# Patient Record
Sex: Female | Born: 1935 | Race: White | Hispanic: No | Smoking: Never smoker
Health system: Southern US, Community
[De-identification: ages and names within clinical notes are randomized; demographics above are authoritative.]

## PROBLEM LIST (undated history)

## (undated) DIAGNOSIS — H409 Unspecified glaucoma: Secondary | ICD-10-CM

## (undated) DIAGNOSIS — M1990 Unspecified osteoarthritis, unspecified site: Secondary | ICD-10-CM

## (undated) DIAGNOSIS — L02419 Cutaneous abscess of limb, unspecified: Secondary | ICD-10-CM

## (undated) DIAGNOSIS — K219 Gastro-esophageal reflux disease without esophagitis: Secondary | ICD-10-CM

## (undated) DIAGNOSIS — J45909 Unspecified asthma, uncomplicated: Secondary | ICD-10-CM

## (undated) DIAGNOSIS — E039 Hypothyroidism, unspecified: Secondary | ICD-10-CM

## (undated) DIAGNOSIS — C801 Malignant (primary) neoplasm, unspecified: Secondary | ICD-10-CM

## (undated) DIAGNOSIS — H269 Unspecified cataract: Secondary | ICD-10-CM

## (undated) DIAGNOSIS — E079 Disorder of thyroid, unspecified: Secondary | ICD-10-CM

## (undated) DIAGNOSIS — I10 Essential (primary) hypertension: Secondary | ICD-10-CM

## (undated) DIAGNOSIS — L03119 Cellulitis of unspecified part of limb: Secondary | ICD-10-CM

## (undated) DIAGNOSIS — D649 Anemia, unspecified: Secondary | ICD-10-CM

## (undated) DIAGNOSIS — K222 Esophageal obstruction: Secondary | ICD-10-CM

## (undated) HISTORY — DX: Essential (primary) hypertension: I10

## (undated) HISTORY — PX: FOOT SURGERY: SHX648

## (undated) HISTORY — DX: Disorder of thyroid, unspecified: E07.9

## (undated) HISTORY — PX: CATARACT EXTRACTION: SUR2

## (undated) HISTORY — DX: Unspecified cataract: H26.9

## (undated) HISTORY — DX: Unspecified osteoarthritis, unspecified site: M19.90

## (undated) HISTORY — PX: APPENDECTOMY: SHX54

## (undated) HISTORY — DX: Unspecified glaucoma: H40.9

## (undated) HISTORY — DX: Gastro-esophageal reflux disease without esophagitis: K21.9

## (undated) HISTORY — PX: EYE SURGERY: SHX253

## (undated) HISTORY — DX: Esophageal obstruction: K22.2

---

## 2000-02-01 ENCOUNTER — Encounter: Admission: RE | Admit: 2000-02-01 | Discharge: 2000-04-28 | Payer: Self-pay | Admitting: Orthopedic Surgery

## 2000-04-28 ENCOUNTER — Encounter: Admission: RE | Admit: 2000-04-28 | Discharge: 2000-07-27 | Payer: Self-pay | Admitting: Orthopedic Surgery

## 2000-07-13 ENCOUNTER — Ambulatory Visit (HOSPITAL_COMMUNITY): Admission: RE | Admit: 2000-07-13 | Discharge: 2000-07-13 | Payer: Self-pay | Admitting: Orthopedic Surgery

## 2000-07-13 ENCOUNTER — Encounter: Payer: Self-pay | Admitting: Orthopedic Surgery

## 2000-08-01 ENCOUNTER — Ambulatory Visit (HOSPITAL_COMMUNITY): Admission: RE | Admit: 2000-08-01 | Discharge: 2000-08-01 | Payer: Self-pay | Admitting: Orthopedic Surgery

## 2000-08-01 ENCOUNTER — Encounter: Payer: Self-pay | Admitting: Orthopedic Surgery

## 2000-08-08 ENCOUNTER — Encounter: Admission: RE | Admit: 2000-08-08 | Discharge: 2000-11-06 | Payer: Self-pay | Admitting: Orthopedic Surgery

## 2000-08-15 ENCOUNTER — Ambulatory Visit (HOSPITAL_COMMUNITY): Admission: RE | Admit: 2000-08-15 | Discharge: 2000-08-15 | Payer: Self-pay | Admitting: Neurosurgery

## 2000-08-15 ENCOUNTER — Encounter: Payer: Self-pay | Admitting: Neurosurgery

## 2000-11-15 ENCOUNTER — Encounter: Admission: RE | Admit: 2000-11-15 | Discharge: 2001-02-13 | Payer: Self-pay | Admitting: Internal Medicine

## 2001-03-07 ENCOUNTER — Encounter: Admission: RE | Admit: 2001-03-07 | Discharge: 2001-03-12 | Payer: Self-pay | Admitting: Internal Medicine

## 2001-06-26 ENCOUNTER — Encounter: Admission: RE | Admit: 2001-06-26 | Discharge: 2001-07-03 | Payer: Self-pay | Admitting: Orthopedic Surgery

## 2001-10-16 ENCOUNTER — Encounter: Admission: RE | Admit: 2001-10-16 | Discharge: 2001-10-22 | Payer: Self-pay | Admitting: Orthopedic Surgery

## 2002-02-06 ENCOUNTER — Encounter: Admission: RE | Admit: 2002-02-06 | Discharge: 2002-02-11 | Payer: Self-pay | Admitting: Internal Medicine

## 2002-06-10 ENCOUNTER — Encounter (HOSPITAL_BASED_OUTPATIENT_CLINIC_OR_DEPARTMENT_OTHER): Admission: RE | Admit: 2002-06-10 | Discharge: 2002-06-14 | Payer: Self-pay | Admitting: Orthopedic Surgery

## 2002-09-16 ENCOUNTER — Encounter (HOSPITAL_BASED_OUTPATIENT_CLINIC_OR_DEPARTMENT_OTHER): Admission: RE | Admit: 2002-09-16 | Discharge: 2002-12-15 | Payer: Self-pay | Admitting: Internal Medicine

## 2002-12-23 ENCOUNTER — Encounter (HOSPITAL_BASED_OUTPATIENT_CLINIC_OR_DEPARTMENT_OTHER): Admission: RE | Admit: 2002-12-23 | Discharge: 2003-03-23 | Payer: Self-pay | Admitting: Internal Medicine

## 2003-03-25 ENCOUNTER — Encounter (HOSPITAL_BASED_OUTPATIENT_CLINIC_OR_DEPARTMENT_OTHER): Admission: RE | Admit: 2003-03-25 | Discharge: 2003-06-23 | Payer: Self-pay | Admitting: Internal Medicine

## 2003-09-26 ENCOUNTER — Encounter (HOSPITAL_BASED_OUTPATIENT_CLINIC_OR_DEPARTMENT_OTHER): Admission: RE | Admit: 2003-09-26 | Discharge: 2003-10-21 | Payer: Self-pay | Admitting: Internal Medicine

## 2004-01-08 ENCOUNTER — Encounter (HOSPITAL_BASED_OUTPATIENT_CLINIC_OR_DEPARTMENT_OTHER): Admission: RE | Admit: 2004-01-08 | Discharge: 2004-02-16 | Payer: Self-pay | Admitting: Internal Medicine

## 2004-05-12 ENCOUNTER — Encounter (HOSPITAL_BASED_OUTPATIENT_CLINIC_OR_DEPARTMENT_OTHER): Admission: RE | Admit: 2004-05-12 | Discharge: 2004-05-27 | Payer: Self-pay | Admitting: Internal Medicine

## 2004-08-17 ENCOUNTER — Encounter (HOSPITAL_BASED_OUTPATIENT_CLINIC_OR_DEPARTMENT_OTHER): Admission: RE | Admit: 2004-08-17 | Discharge: 2004-08-31 | Payer: Self-pay | Admitting: Internal Medicine

## 2004-11-15 ENCOUNTER — Encounter (HOSPITAL_BASED_OUTPATIENT_CLINIC_OR_DEPARTMENT_OTHER): Admission: RE | Admit: 2004-11-15 | Discharge: 2004-12-03 | Payer: Self-pay | Admitting: Internal Medicine

## 2005-04-05 ENCOUNTER — Ambulatory Visit (HOSPITAL_COMMUNITY): Admission: RE | Admit: 2005-04-05 | Discharge: 2005-04-05 | Payer: Self-pay | Admitting: Ophthalmology

## 2006-12-25 ENCOUNTER — Emergency Department (HOSPITAL_COMMUNITY): Admission: EM | Admit: 2006-12-25 | Discharge: 2006-12-25 | Payer: Self-pay | Admitting: *Deleted

## 2010-12-15 ENCOUNTER — Encounter: Payer: Self-pay | Admitting: Gastroenterology

## 2011-01-06 NOTE — Letter (Signed)
Summary: New Patient letter  [facility]  [address] [place], [place] [address]   Phone: [phone]  Fax: [phone]       12/15/2010 MRN: [id number]  [facility] [address] [place], [place]  [address]  Dear Ms. Nardo,  Welcome to the Gastroenterology Division at [facility].    You are scheduled to see Dr.  Arlyce Dice on 01-19-11 at 10:45am on the 3rd floor at [facility], [address]  We ask that you try to arrive at our office 15 minutes prior to your appointment time to allow for check-in.  We would like you to complete the enclosed self-administered evaluation form prior to your visit and bring it with you on the day of your appointment.  We will review it with you.  Also, please bring a complete list of all your medications or, if you prefer, bring the medication bottles and we will list them.  Please bring your insurance card so that we may make a copy of it.  If your insurance requires a referral to see a specialist, please bring your referral form from your primary care physician.  Co-payments are due at the time of your visit and may be paid by cash, check or credit card.     Your office visit will consist of a consult with your physician (includes a physical exam), any laboratory testing he/she may order, scheduling of any necessary diagnostic testing (e.g. x-ray, ultrasound, CT-scan), and scheduling of a procedure (e.g. Endoscopy, Colonoscopy) if required.  Please allow enough time on your schedule to allow for any/all of these possibilities.    If you cannot keep your appointment, please call [phone] to cancel or reschedule prior to your appointment date.  This allows [place] the opportunity to schedule an appointment for another patient in need of care.  If you do not cancel or reschedule by 5 p.m. the business day prior to your appointment date, you will be charged a $50.00 late cancellation/no-show fee.    Thank you for  choosing [facility] for your medical needs.  We appreciate the opportunity to care for you.  Please visit [place] at our website  to learn more about our practice.                     Sincerely,                                                             The Gastroenterology Division

## 2011-01-19 ENCOUNTER — Ambulatory Visit (INDEPENDENT_AMBULATORY_CARE_PROVIDER_SITE_OTHER): Payer: Medicare Other | Admitting: Gastroenterology

## 2011-01-19 ENCOUNTER — Encounter: Payer: Self-pay | Admitting: Gastroenterology

## 2011-01-19 ENCOUNTER — Encounter (INDEPENDENT_AMBULATORY_CARE_PROVIDER_SITE_OTHER): Payer: Self-pay | Admitting: *Deleted

## 2011-01-19 DIAGNOSIS — R1310 Dysphagia, unspecified: Secondary | ICD-10-CM

## 2011-01-19 DIAGNOSIS — E119 Type 2 diabetes mellitus without complications: Secondary | ICD-10-CM | POA: Insufficient documentation

## 2011-01-21 ENCOUNTER — Other Ambulatory Visit: Payer: Self-pay | Admitting: Gastroenterology

## 2011-01-21 ENCOUNTER — Encounter (AMBULATORY_SURGERY_CENTER): Payer: Medicare Other | Admitting: Gastroenterology

## 2011-01-21 DIAGNOSIS — K222 Esophageal obstruction: Secondary | ICD-10-CM

## 2011-01-21 DIAGNOSIS — R1310 Dysphagia, unspecified: Secondary | ICD-10-CM

## 2011-01-21 DIAGNOSIS — K259 Gastric ulcer, unspecified as acute or chronic, without hemorrhage or perforation: Secondary | ICD-10-CM

## 2011-01-21 DIAGNOSIS — K2940 Chronic atrophic gastritis without bleeding: Secondary | ICD-10-CM

## 2011-01-21 LAB — GLUCOSE, CAPILLARY
Glucose-Capillary: 115 mg/dL — ABNORMAL HIGH (ref 70–99)
Glucose-Capillary: 105 mg/dL — ABNORMAL HIGH (ref 70–99)

## 2011-01-24 ENCOUNTER — Encounter: Payer: Self-pay | Admitting: Gastroenterology

## 2011-01-26 NOTE — Letter (Signed)
Summary: Diabetic Instructions  [facility]  [address] [place], [place] [address]   Phone: [phone]  Fax: [phone]    YVONDA FOUTY 1924-05-17 MRN: [id number]   X   ORAL DIABETIC MEDICATION INSTRUCTIONS  The day before your procedure:   Take your diabetic pill as you do normally  The day of your procedure:   Do not take your diabetic pill    We will check your blood sugar levels during the admission process and again in Recovery before discharging you home

## 2011-01-26 NOTE — Assessment & Plan Note (Signed)
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   History of Present Illness Visit Type: Initial Consult Primary GI MD: Melvia Heaps MD [facility] Primary Provider: Guerry Bruin, MD Requesting Provider: Guerry Bruin, MD Chief Complaint: Patient complains of solid food dysphagia and epigastric pain which she has had for several months. She does have trouble with having a BM. She has to sit on the toliet for 30-40 min to have a BM. She has hx of a prolapsed rectum. History of Present Illness:   Casey Watts is a pleasant 77 year old white female referred at the request of Dr. Neale Burly for evaluation of dysphagia.  She complains of dysphagia to solids.  When this occurs she may develop chest discomfort as well.  She is actually had to vomit up food and liquid.  On Prevacid she has minimal pyrosis.  Weight has been stable.  Years ago she was told that she had a narrowing in her distal esophagus.  Patient has a history of a prolapsed rectum.  While she has a daily bowel movement she has difficulty  passing stool and she may spend up to 40 minutes moving her bowels.  Stools are poorly formed.  She denies melena or hematochezia.  She has had a colonoscopy in the past and does not wish to repeat this.   GI Review of Systems    Reports dysphagia with solids.     Location of  Abdominal pain: epigastric area.    Denies abdominal pain, acid reflux, belching, bloating, chest pain, dysphagia with liquids, heartburn, loss of appetite, nausea, vomiting, vomiting blood, weight loss, and  weight gain.      Reports constipation and  diarrhea.     Denies anal fissure, black tarry stools, change in bowel habit, diverticulosis, fecal incontinence, heme positive stool, hemorrhoids, irritable bowel syndrome, jaundice, light color stool, liver problems, rectal bleeding, and  rectal pain. Preventive Screening-Counseling & Management  Alcohol-Tobacco     Smoking Status: never      Drug Use:   no.      Current Medications (verified): 1)  Prevacid 24hr 15 Mg Cpdr (Lansoprazole) .... Take One By Mouth Once Daily 2)  Synthroid 112 Mcg Tabs (Levothyroxine Sodium) .... Take One By Mouth Once Daily 3)  Evista 60 Mg Tabs (Raloxifene Hcl) .... Take One By Mouth Once Daily 4)  Diovan Hct 160-25 Mg Tabs (Valsartan-Hydrochlorothiazide) .... Take One By Mouth Once Daily 5)  Glimepiride 4 Mg Tabs (Glimepiride) .... Take One By Mouth Once Daily 6)  Celebrex 200 Mg Caps (Celecoxib) .... Take One By Mouth Every Morning 7)  Claritin 10 Mg Caps (Loratadine) .... Take One By Mouth As Needed 8)  D 1000 Plus  Tabs (Fa-Cyanocobalamin-B6-D-Ca) .... Take One By Mouth Once Daily 9)  Delsym Night Time Cough/cold 15-6.25-500 Mg/88ml Liqd (Dm-Doxylamine-Acetaminophen) .... Use As Needed 10)  Aleve 220 Mg Caps (Naproxen Sodium) .... Take Two By Mouth At Bedtime 11)  Tylenol Extra Strength 500 Mg Tabs (Acetaminophen) .... Take One By Mouth Once Daily  Allergies (verified): No Known Drug Allergies  Past History:  Past Medical History: Arthritis Diabetes Hypothyroidism  Past Surgical History: Appendectomy Cataract Extraction  foot surgery  Family History: Family History of Lung Cancer: Brother Smoker No FH of Colon Cancer: Family History of Heart Disease: brother  Social History: Patient has never smoked.  Alcohol Use - no Daily Caffeine Use 2 per day Illicit Drug Use - no Occupation: retired  widowed with four boys and 2  girls one deceased Smoking Status:  never Drug Use:  no  Review of Systems       The patient complains of allergy/sinus, arthritis/joint pain, back pain, cough, and hearing problems.  The patient denies anemia, anxiety-new, blood in urine, breast changes/lumps, change in vision, confusion, coughing up blood, depression-new, fainting, fatigue, fever, headaches-new, heart murmur, heart rhythm changes, itching, menstrual pain, muscle pains/cramps, night sweats, nosebleeds,  pregnancy symptoms, shortness of breath, skin rash, sleeping problems, sore throat, swelling of feet/legs, swollen lymph glands, thirst - excessive , urination - excessive , urination changes/pain, urine leakage, vision changes, and voice change.         All other systems were reviewed and were negative   Vital Signs:  Patient profile:   77 year old female Height:      61 inches Weight:      131.8 pounds BMI:     24.99 Pulse rate:   60 / minute Pulse rhythm:   regular BP sitting:   102 / 50  (left arm) Cuff size:   regular  Vitals Entered By: Harlow Mares CMA Duncan Dull) (January 19, 2011 11:19 AM)  Physical Exam  Additional Exam:  On physical exam she is an elderly female  skin: anicteric HEENT: normocephalic; PEERLA; no nasal or pharyngeal abnormalities neck: supple nodes: no cervical lymphadenopathy chest: clear to ausculatation and percussion heart: no murmurs, gallops, or rubs abd: soft, nontender; BS normoactive; no abdominal masses, tenderness, organomegaly rectal: deferred ext: no cynanosis, clubbing, edema skeletal: no deformities neuro: oriented x 3; no focal abnormalities    Impression & Recommendations:  Problem # 1:  DYSPHAGIA UNSPECIFIED (ICD-787.20)  She likely has a peptic stricture.  Recommendations #1 upper endoscopy with dilatation as indicated  Risks, alternatives, and complications of the procedure, including bleeding, perforation, and possible need for surgery, were explained to the patient.  Patient's questions were answered.  Orders: EGD (EGD)  Problem # 2:  DM (ICD-250.00) Assessment: Comment Only  Patient Instructions: 1)  Copy sent to : Guerry Bruin, MD 2)  Your procedure has been scheduled for 01/21/2011, please follow the seperate instructions.  3)  [facility] Patient Information Guide given to patient.  4)  Upper Endoscopy with Dilatation brochure given.  5)  The medication list was reviewed and reconciled.  All  changed / newly prescribed medications were explained.  A complete medication list was provided to the patient / caregiver.

## 2011-01-26 NOTE — Letter (Signed)
Summary: EGD Instructions  [facility]  [address] [place], [place] [address]   Phone: [phone]  Fax: [phone]       Casey Watts    10/09/1924    MRN: [id number]       Procedure Day /Date: Friday 01/21/2011     Arrival Time: 1:30pm     Procedure Time: 2:30pm     Location of Procedure:                    [facility] (4th Floor)  PREPARATION FOR ENDOSCOPY   On 01/21/2011 THE DAY OF THE PROCEDURE:  1.   No solid foods, milk or milk products are allowed after midnight the night before your procedure.  2.   Do not drink anything colored red or purple.  Avoid juices with pulp.  No orange juice.  3.  You may drink clear liquids until 12:30pm, which is 2 hours before your procedure.                                                                                                CLEAR LIQUIDS INCLUDE: Water Jello Ice Popsicles Tea (sugar ok, no milk/cream) Powdered fruit flavored drinks Coffee (sugar ok, no milk/cream) Gatorade Juice: apple, white grape, white cranberry  Lemonade Clear bullion, consomm, broth Carbonated beverages (any kind) Strained chicken noodle soup Hard Candy   MEDICATION INSTRUCTIONS  Unless otherwise instructed, you should take regular prescription medications with a small sip of water as early as possible the morning of your procedure.  Diabetic patients - see separate instructions.               OTHER INSTRUCTIONS  You will need a responsible adult at least 75 years of age to accompany you and drive you home.   This person must remain in the waiting room during your procedure.  Wear loose fitting clothing that is easily removed.  Leave jewelry and other valuables at home.  However, you may wish to bring a book to read or an iPod/MP3 player to listen to music as you wait for your procedure to start.  Remove all body piercing jewelry and leave at home.  Total time from sign-in until discharge is  approximately 2-3 hours.  You should go home directly after your procedure and rest.  You can resume normal activities the day after your procedure.  The day of your procedure you should not:   Drive   Make legal decisions   Operate machinery   Drink alcohol   Return to work  You will receive specific instructions about eating, activities and medications before you leave.    The above instructions have been reviewed and explained to me by   _______________________    I fully understand and can verbalize these instructions _____________________________ Date _________

## 2011-01-26 NOTE — Letter (Signed)
Summary: Results Letter  [facility]  [address] [place], [place] [address]   Phone: [phone]  Fax: [phone]        January 19, 2011 MRN: [id number]    [facility] [address] [place], [place]  [address]    Dear Ms. Facey,  It is my pleasure to have treated you recently as a new patient in my office. I appreciate your confidence and the opportunity to participate in your care.  Since I do have a busy inpatient endoscopy schedule and office schedule, my office hours vary weekly. I am, however, available for emergency calls everyday through my office. If I am not available for an urgent office appointment, another one of our gastroenterologist will be able to assist you.  My well-trained staff are prepared to help you at all times. For emergencies after office hours, a physician from our Gastroenterology section is always available through my 24 hour answering service  Once again I welcome you as a new patient and I look forward to a happy and healthy relationship             Sincerely,  Louis Meckel MD  This letter has been electronically signed by your physician.  Appended Document: Results Letter letter mailed

## 2011-01-26 NOTE — Procedures (Addendum)
Summary: Upper Endoscopy  Patient: Casey Watts Note: All result statuses are Final unless otherwise noted.  Tests: (1) Upper Endoscopy (EGD)   EGD Upper Endoscopy       DONE      [facility] Center     [address]     [place], [place]  [address]           ENDOSCOPY PROCEDURE REPORT           PATIENT:  Casey, Watts  MR#:  [id number]     BIRTHDATE:  11/18/1924, 86 yrs. old  GENDER:  female           ENDOSCOPIST:  Barbette Hair. Arlyce Dice, MD     Referred by:  Guerry Bruin, M.D.           PROCEDURE DATE:  01/21/2011     PROCEDURE:  EGD with biopsy, [address], Maloney Dilation of Esophagus     ASA CLASS:  Class II     INDICATIONS:  dysphagia           MEDICATIONS:   Fentanyl 25 mcg IV, Versed 4 mg IV, glycopyrrolate     (Robinal) 0.2 mg IV, 0.6cc simethancone 0.6 cc PO     TOPICAL ANESTHETIC:  Exactacain Spray           DESCRIPTION OF PROCEDURE:   After the risks benefits and     alternatives of the procedure were thoroughly explained, informed     consent was obtained.  The LB GIF-H180 [id number] endoscope was     introduced through the mouth and advanced to the third portion of     the duodenum, without limitations.  The instrument was slowly     withdrawn as the mucosa was fully examined.     <<PROCEDUREIMAGES>>           A stricture was found at the gastroesophageal junction. Early     stricture (see image9). Slightly tortuous esophagus Dilation with     maloney dilator 18mm Minimal resistance; no heme  An ulcer was     found pyloric channel Superficial 3-16mm ulcer  Multiple ulcers     were found in the antrum (see image6). Multiple superficial     ulcers; bxs taken  Otherwise the examination was normal (see     image4, image5, image8, and image10).    Retroflexion was not     performed.  The scope was then withdrawn from the patient and the     procedure completed.           COMPLICATIONS:  None           ENDOSCOPIC IMPRESSION:     1) Stricture at the gastroesophageal  junction     2) Ulcer in the pyloric channel     3) Ulcers, multiple in the antrum     4) Otherwise normal examination     RECOMMENDATIONS:     1) avoid NSAIDS     2) continue PPI     3) Call office next 2-3 days to schedule an office appointment     for 1 month           REPEAT EXAM:  No           ______________________________     Barbette Hair. Arlyce Dice, MD           CC:           n.     eSIGNED:   Barbette Hair. Arlyce Dice at  01/21/2011 04:00 PM           Hewitt Shorts, [id number]  Note: An exclamation mark (!) indicates a result that was not dispersed into the flowsheet. Document Creation Date: 01/21/2011 4:00 PM _______________________________________________________________________  (1) Order result status: Final Collection or observation date-time: 01/21/2011 15:52 Requested date-time:  Receipt date-time:  Reported date-time:  Referring Physician:   Ordering Physician: Melvia Heaps [phone]) Specimen Source:  Source: Launa Grill Order Number: [id number] Lab site:

## 2011-01-27 ENCOUNTER — Encounter: Payer: Self-pay | Admitting: Gastroenterology

## 2011-02-01 NOTE — Letter (Signed)
Summary: Appt Reminder 2  [facility]  [address]   [place], [place] [address]   Phone: [phone]  Fax: [phone]        January 24, 2011 MRN: [id number]    [facility] [address] [place], [place]  [address]    Dear Ms. Wadle,   You have a return appointment with Dr. Arlyce Dice on 02/28/11 at 9am.  Please remember to bring a complete list of the medicines you are taking, your insurance card and your co-pay.  If you have to cancel or reschedule this appointment, please call before 5:00 pm the evening before to avoid a cancellation fee.  If you have any questions or concerns, please call [phone].    Sincerely,    Selinda Michaels RN  Appended Document: Appt Reminder 2 Letter is mailed to the patient's home address

## 2011-02-01 NOTE — Letter (Addendum)
Summary: Results Letter  [facility]  [address] [place], [place] [address]   Phone: [phone]  Fax: [phone]        January 27, 2011 MRN: [id number]    [facility] [address] [place], [place]  [address]    Dear Ms. Rennert,   Your biopsies demonstrated inflammatory changes only.    Please follow the recommendations previously discussed.  Should you have any immediate concerns or questions, feel free to contact me at the office.    Sincerely,  Barbette Hair. Arlyce Dice, M.D., [facility]          Sincerely,  Louis Meckel MD  This letter has been electronically signed by your physician.  Appended Document: Results Letter letter mailed

## 2011-02-28 ENCOUNTER — Ambulatory Visit: Payer: Medicare Other | Admitting: Gastroenterology

## 2011-02-28 ENCOUNTER — Encounter: Payer: Self-pay | Admitting: Gastroenterology

## 2011-02-28 ENCOUNTER — Ambulatory Visit (INDEPENDENT_AMBULATORY_CARE_PROVIDER_SITE_OTHER): Payer: Medicare Other | Admitting: Gastroenterology

## 2011-02-28 DIAGNOSIS — K267 Chronic duodenal ulcer without hemorrhage or perforation: Secondary | ICD-10-CM | POA: Insufficient documentation

## 2011-02-28 DIAGNOSIS — K222 Esophageal obstruction: Secondary | ICD-10-CM | POA: Insufficient documentation

## 2011-02-28 NOTE — Assessment & Plan Note (Signed)
She has an NSAID- related ulcer.    recommendations #1 patient was advised to avoid NSAIDs with the exception of occasional Celebrex while continuing Prevacid.

## 2011-02-28 NOTE — Assessment & Plan Note (Signed)
Patient is asymptomatic following dilatation.   Recommendations #1 repeat dilatation when necessary

## 2011-02-28 NOTE — Progress Notes (Signed)
History of Present Illness:   Casey Watts has returned following upper endoscopy with dilatation of a distal esophageal stricture. A. Duodenal ulcer was noted. Biopsies were negative for H. Pylori. The patient had been taking Aleve regularly. She continues on Prevacid. Dysphagia is essentially gone. She has no other GI complaints. She takes Celebrex occasionally for joint pain.    Review of Systems: Pertinent positive and negative review of systems were noted in the above HPI section. All other review of systems were otherwise negative.    Current Medications, Allergies, Past Medical History, Past Surgical History, Family History and Social History were reviewed in [employer] electronic medical record  Vital signs were reviewed in today's medical record.

## 2011-02-28 NOTE — Patient Instructions (Addendum)
Cc. Richard Tisovec,MD Follow up as neededEsophageal Stricture (Narrowing) The esophagus is the long, narrow tube which carries food and liquid from the mouth to the stomach. Sometimes a part of the esophagus becomes narrow and makes it difficult, painful, or even impossible to swallow. This is called an esophageal stricture.  SYMPTOMS Some of the problems are difficulty swallowing or pain with swallowing. CAUSES Common causes of blockage or strictures of the esophagus are:  Exposure of the lower esophagus to the acid from the stomach may cause narrowing.   Hiatal hernia in which a small part of the stomach bulges up through the diaphragm can cause a narrowing in the bottom of the esophagus.   Scleroderma is a tissue disorder that affects the esophagus and makes swallowing difficult.   Achalasia is an absence of nerves in the lower esophagus and to the esophageal sphincter. This absence of nerves may be congenital (present since birth). This can cause irregular spasms which do not allow food and fluid through.   Strictures may develop from swallowing materials which damage the esophagus. Examples are acids or alkalis such as lye.   Schatzki's Ring is a narrow ring of non-cancerous tissue which narrows the lower esophagus. The cause of this is unknown.   Growths can block the esophagus.  DIAGNOSIS Your caregiver often suspects this problem by taking a medical history. They will also do a physical exam. They may then take X-rays and/or perform an endoscopy. Endoscopy is an exam in which a tube like a small flexible telescope is used to look at your esophagus.  TREATMENT AND PROCEDURE  One form of treatment is to dilate the narrow area. This means to stretch it.   When this is not successful, chest surgery may be required. This is a much more extensive form of treatment with a longer recovery time.  Both of the above treatments make the passage of food and water into the stomach easier. They  also make it easier for stomach contents to bubble back into the esophagus. Special medications may be used following the procedure to help prevent further narrowing. Medications may be used to lower the amount of acid in the stomach juice.  SEEK IMMEDIATE MEDICAL CARE IF:  Your swallowing is becoming more painful, difficult, or you are unable to swallow.   You vomit up blood.   You develop black tarry stools.   You develop chills or an unexplained fever of over 101 F (38.3 C).   You develop chest or abdominal pain.   You develop shortness of breath, feel lightheaded, or faint.  Follow up with medical care as your caregiver suggests. Document Released: 08/01/2006 Document Re-Released: 09/18/2007 [facility] Patient Information 2011 [place], [place].

## 2014-01-23 ENCOUNTER — Encounter (HOSPITAL_COMMUNITY): Payer: Self-pay | Admitting: Emergency Medicine

## 2014-01-23 ENCOUNTER — Emergency Department (HOSPITAL_COMMUNITY): Payer: Medicare Other

## 2014-01-23 ENCOUNTER — Emergency Department (HOSPITAL_COMMUNITY)
Admission: EM | Admit: 2014-01-23 | Discharge: 2014-01-23 | Disposition: A | Discharge status: Home / Self Care | Payer: Medicare Other | Attending: Emergency Medicine | Admitting: Emergency Medicine

## 2014-01-23 DIAGNOSIS — Y929 Unspecified place or not applicable: Secondary | ICD-10-CM | POA: Insufficient documentation

## 2014-01-23 DIAGNOSIS — E079 Disorder of thyroid, unspecified: Secondary | ICD-10-CM | POA: Insufficient documentation

## 2014-01-23 DIAGNOSIS — K219 Gastro-esophageal reflux disease without esophagitis: Secondary | ICD-10-CM | POA: Insufficient documentation

## 2014-01-23 DIAGNOSIS — I10 Essential (primary) hypertension: Secondary | ICD-10-CM | POA: Insufficient documentation

## 2014-01-23 DIAGNOSIS — M129 Arthropathy, unspecified: Secondary | ICD-10-CM | POA: Insufficient documentation

## 2014-01-23 DIAGNOSIS — Z79899 Other long term (current) drug therapy: Secondary | ICD-10-CM | POA: Insufficient documentation

## 2014-01-23 DIAGNOSIS — Y9389 Activity, other specified: Secondary | ICD-10-CM | POA: Insufficient documentation

## 2014-01-23 DIAGNOSIS — S42293A Other displaced fracture of upper end of unspecified humerus, initial encounter for closed fracture: Secondary | ICD-10-CM

## 2014-01-23 DIAGNOSIS — R296 Repeated falls: Secondary | ICD-10-CM | POA: Insufficient documentation

## 2014-01-23 DIAGNOSIS — Z791 Long term (current) use of non-steroidal anti-inflammatories (NSAID): Secondary | ICD-10-CM | POA: Insufficient documentation

## 2014-01-23 DIAGNOSIS — E119 Type 2 diabetes mellitus without complications: Secondary | ICD-10-CM | POA: Insufficient documentation

## 2014-01-23 MED ORDER — DOCUSATE SODIUM 100 MG PO CAPS: 100.0000 mg | ORAL_CAPSULE | Freq: Two times a day (BID) | ORAL | Status: AC | PRN

## 2014-01-23 MED ORDER — HYDROCODONE-ACETAMINOPHEN 5-325 MG PO TABS
ORAL_TABLET | ORAL | Status: DC
Start: 2014-01-23 — End: 2014-02-06

## 2014-01-23 MED ORDER — HYDROCODONE-ACETAMINOPHEN 5-325 MG PO TABS
1.0000 | ORAL_TABLET | Freq: Once | ORAL | Status: AC
  Administered 2014-01-23: 1 via ORAL
  Filled 2014-01-23: qty 1

## 2014-01-23 NOTE — Discharge Instructions (Signed)
Shoulder Fracture You have a fractured humerus (bone in the upper arm) at the shoulder just below the ball of the shoulder joint. Most of the time the bones of a broken shoulder are in an acceptable position. Usually the injury can be treated with a shoulder immobilizer or sling and swath bandage. These devices support the arm and prevent any shoulder movement. If the bones are not in a good position, then surgery is sometimes needed. Shoulder fractures usually cause swelling, pain, and discoloration around the upper arm initially. They heal in 8-12 weeks with proper treatment. Rest in bed or a reclining chair as long as your shoulder is very painful. Sitting up generally results in less pain at the fracture site. Do not remove your shoulder bandage until your caregiver approves. You may apply ice packs over the shoulder for 20-30 minutes every 2 hours for the next 2-3 days to reduce the pain and swelling. Use your pain medicine as prescribed.  SEEK IMMEDIATE MEDICAL CARE IF:  You develop severe shoulder pain unrelieved by rest and taking pain medicine.  You have pain, numbness, tingling, or weakness in the hand or wrist.  You develop shortness of breath, chest pain, severe weakness, or fainting.  You have severe pain with motion of the fingers or wrist. MAKE SURE YOU:   Understand these instructions.  Will watch your condition.  Will get help right away if you are not doing well or get worse. Document Released: 12/29/2004 Document Revised: 02/13/2012 Document Reviewed: 03/11/2009 [facility] Patient Information 2014 [place], [place].    Narcotic and benzodiazepine use may cause drowsiness, slowed breathing or dependence.  Please use with caution and do not drive, operate machinery or watch young children alone while taking them.  Taking combinations of these medications or drinking alcohol will potentiate these effects.

## 2014-01-23 NOTE — Progress Notes (Signed)
Orthopedic Tech Progress Note Patient Details:  Casey Watts May 07, 1924 [id number]  Ortho Devices Type of Ortho Device: Shoulder immobilizer Ortho Device/Splint Location: LUE Ortho Device/Splint Interventions: Ordered;Application   Braulio Bosch 01/23/2014, 5:56 PM

## 2014-01-23 NOTE — ED Notes (Addendum)
Pt reports to the ED via [facility] following a fall. Pt reports the fall occurred at 12:30 and pt was found sitting on the floor in the kitchen. Pt landed on her left upper arm and reports that the pain would not allow her to stand up. No obvious swelling or deformity present. Pt denies any LOC, syncope, head injury, neck, or acute back pain. Pt reports chronic back pain from scoliosis and denies any increased back pain. Pt reports she lost her balance but did not pass out. CMS intact. Full ROM limited by pain. VSS en route. Pt A&Ox4, resp e/u, and skin warm and dry.

## 2014-01-23 NOTE — ED Provider Notes (Signed)
CSN: [id number]     Arrival date & time 01/23/14  1414 History   First MD Initiated Contact with Patient 01/23/14 1506     Chief Complaint  Patient presents with  . Fall     (Consider location/radiation/quality/duration/timing/severity/associated sxs/prior Treatment) Patient is a 80 y.o. female presenting with fall and arm injury. The history is provided by the patient.  Fall This is a new problem. The current episode started 1 to 2 hours ago. Pertinent negatives include no chest pain, no abdominal pain, no headaches and no shortness of breath.  Arm Injury Location:  Shoulder and wrist Time since incident:  1 hour Injury: yes   Mechanism of injury: fall   Fall:    Fall occurred:  [employer] of impact:  Unable to specify   Entrapped after fall: no   Shoulder location:  L shoulder Wrist location:  L wrist Pain details:    Quality:  Shooting, cramping and sharp   Radiates to:  L wrist   Severity:  Moderate   Onset quality:  Sudden   Duration:  1 hour Chronicity:  New Handedness:  Right-handed Prior injury to area:  No Relieved by:  Rest Worsened by:  Movement Associated symptoms: decreased range of motion, stiffness and swelling   Associated symptoms: no back pain, no neck pain and no numbness   Risk factors: no concern for non-accidental trauma, no frequent fractures and no recent illness     Past Medical History  Diagnosis Date  . Diabetes mellitus   . Thyroid disease   . GERD (gastroesophageal reflux disease)   . Arthritis   . Hypertension   . Esophageal stricture    Past Surgical History  Procedure Laterality Date  . Appendectomy    . Cataract extraction    . Foot surgery     Family History  Problem Relation Age of Onset  . Colon cancer Neg Hx    History  Substance Use Topics  . Smoking status: Never Smoker   . Smokeless tobacco: Never Used  . Alcohol Use: No   OB History   Grav Para Term Preterm Abortions TAB SAB Ect Mult Living                  Review of Systems  Respiratory: Negative for chest tightness and shortness of breath.   Cardiovascular: Negative for chest pain.  Gastrointestinal: Negative for nausea, vomiting and abdominal pain.  Musculoskeletal: Positive for stiffness. Negative for back pain and neck pain.  Skin: Negative for wound.  Neurological: Negative for dizziness, syncope, light-headedness and headaches.  All other systems reviewed and are negative.      Allergies  Review of patient's allergies indicates no known allergies.  Home Medications   Current Outpatient Rx  Name  Route  Sig  Dispense  Refill  . acetaminophen (TYLENOL) 500 MG tablet   Oral   Take 500 mg by mouth daily as needed for mild pain.          . celecoxib (CELEBREX) 200 MG capsule   Oral   Take 200 mg by mouth daily.          Marland Kitchen DM-Doxylamine-Acetaminophen (DELSYM NIGHT TIME COUGH/COLD) 30-12.04-999 MG/30ML LIQD   Oral   Take 10 mLs by mouth every 12 (twelve) hours as needed (cold/cough). Use as needed         . FA-Cyanocobalamin-B6-D-Ca (D 1000 PLUS) TABS   Oral   Take 1 tablet by mouth daily.          Marland Kitchen  glimepiride (AMARYL) 4 MG tablet   Oral   Take 4 mg by mouth 2 (two) times daily.          . lansoprazole (PREVACID) 15 MG capsule   Oral   Take 15 mg by mouth daily.           Marland Kitchen levothyroxine (SYNTHROID, LEVOTHROID) 112 MCG tablet   Oral   Take 112 mcg by mouth daily.          Marland Kitchen SIMPLY SALINE NA   Each Nare   Place 1 spray into both nostrils at bedtime as needed (nasal congestion).         . valsartan-hydrochlorothiazide (DIOVAN-HCT) 160-25 MG per tablet   Oral   Take 1 tablet by mouth daily.           Marland Kitchen docusate sodium (COLACE) 100 MG capsule   Oral   Take 1 capsule (100 mg total) by mouth 2 (two) times daily as needed for mild constipation.   60 capsule   0   . HYDROcodone-acetaminophen (NORCO/VICODIN) 5-325 MG per tablet      1-2 tablets po q 6 hours prn moderate to severe pain    30 tablet   0    BP 152/62  Pulse 70  Temp(Src) 97.5 F (36.4 C) (Oral)  Resp 16  SpO2 97% Physical Exam  Nursing note and vitals reviewed. Constitutional: She appears well-developed and well-nourished. No distress.  HENT:  Head: Normocephalic and atraumatic.  Eyes: Conjunctivae are normal. No scleral icterus.  Neck: Normal range of motion. Neck supple.  Cardiovascular: Normal rate and intact distal pulses.   No murmur heard. Pulmonary/Chest: Effort normal. No respiratory distress.  Abdominal: Soft. She exhibits no distension. There is no tenderness.  Musculoskeletal:       Left shoulder: She exhibits decreased range of motion, tenderness, swelling and pain. She exhibits no effusion, no deformity, no laceration, normal pulse and normal strength.       Left elbow: She exhibits normal range of motion, no swelling and no deformity. No tenderness found.       Left wrist: She exhibits tenderness. She exhibits normal range of motion, no swelling, no crepitus, no deformity and no laceration.       Left hip: She exhibits normal range of motion and no tenderness.       Left knee: No tenderness found.       Cervical back: She exhibits no tenderness.       Lumbar back: She exhibits no tenderness.       Left hand: Normal.  Neurological: She is alert. Coordination normal.  Skin: Skin is warm. She is not diaphoretic.    ED Course  Procedures (including critical care time) Labs Review Labs Reviewed - No data to display Imaging Review Dg Wrist Complete Left  01/23/2014   CLINICAL DATA:  Wrist pain status post fall  EXAM: LEFT WRIST - COMPLETE 3+ VIEW  COMPARISON:  None.  FINDINGS: The bones of the left wrist are diffusely osteopenic. There is no evidence of an acute fracture. There is mild narrowing of the radiocarpal joint. There is calcification of the triangular fibrocartilage. No carpal bone fracture is demonstrated. The distal radius and ulna appear intact. The metacarpals appear intact.   IMPRESSION: There are degenerative changes of the left wrist. There is no evidence of an acute fracture.   Electronically Signed   By: David  Martinique   On: 01/23/2014 16:36   Dg Shoulder Left  01/23/2014  CLINICAL DATA:  Left shoulder pain status post fall.  EXAM: LEFT SHOULDER - 2+ VIEW  COMPARISON:  None.  FINDINGS: The images are limited due to patient's inability to assume positioning desired. There is a fracture through the humeral head and proximal neck. The glenoid cannot be adequately assessed. [facility] joint is grossly intact. The observed portions of the distal left clavicle appear normal.  IMPRESSION: The patient has sustained an acute comminuted displaced fracture of the humeral head and neck but detail is limited. CT scanning is recommended.   Electronically Signed   By: David  Martinique   On: 01/23/2014 16:35    EKG Interpretation   None      RA sat is 97% and I interpret to be adequate    5:24 PM Pain improved.  Plain films shows prox humeral fracture, closed.  Sling, follow up with orthopedist early next week.   MDM   Final diagnoses:  Fracture of humeral head, closed    Pt with likely mechanical fall, has poor balance, knee pain chronically and scoliosis.  She denies syncope, feelgni dizzy, CP.  She has swelling obvious and limited ROM of left shoulder.  Mild pain to left wrist.  Will RICE, norco, plain films.  Pt follows Raliegh Ip and will refer to same.  Arm sling.  Intact distal neurovascular, soft compartments.           Saddie Benders. Livana Yerian, MD 01/23/14 1724

## 2014-01-27 ENCOUNTER — Other Ambulatory Visit: Payer: Self-pay | Admitting: Orthopedic Surgery

## 2014-01-27 DIAGNOSIS — M25512 Pain in left shoulder: Secondary | ICD-10-CM

## 2014-01-28 ENCOUNTER — Other Ambulatory Visit: Payer: Medicare Other

## 2014-01-28 ENCOUNTER — Other Ambulatory Visit: Payer: Self-pay | Admitting: Orthopedic Surgery

## 2014-01-28 ENCOUNTER — Ambulatory Visit
Admission: RE | Admit: 2014-01-28 | Discharge: 2014-01-28 | Disposition: A | Discharge status: Home / Self Care | Payer: Medicare Other | Source: Ambulatory Visit | Attending: Orthopedic Surgery | Admitting: Orthopedic Surgery

## 2014-01-28 DIAGNOSIS — M25512 Pain in left shoulder: Secondary | ICD-10-CM

## 2014-01-29 ENCOUNTER — Encounter (HOSPITAL_COMMUNITY): Payer: Self-pay

## 2014-01-29 ENCOUNTER — Encounter (HOSPITAL_COMMUNITY)
Admission: RE | Admit: 2014-01-29 | Discharge: 2014-01-29 | Disposition: A | Discharge status: Home / Self Care | Payer: Medicare Other | Source: Ambulatory Visit | Attending: Orthopedic Surgery | Admitting: Orthopedic Surgery

## 2014-01-29 ENCOUNTER — Other Ambulatory Visit: Payer: Self-pay | Admitting: Orthopedic Surgery

## 2014-01-29 ENCOUNTER — Ambulatory Visit (HOSPITAL_COMMUNITY)
Admission: RE | Admit: 2014-01-29 | Discharge: 2014-01-29 | Disposition: A | Discharge status: Home / Self Care | Payer: Medicare Other | Source: Ambulatory Visit | Attending: Anesthesiology | Admitting: Anesthesiology

## 2014-01-29 VITALS — BP 103/60 | HR 69 | Temp 98.1°F | Resp 18 | Ht 60.0 in | Wt 134.1 lb

## 2014-01-29 DIAGNOSIS — Z01818 Encounter for other preprocedural examination: Secondary | ICD-10-CM | POA: Insufficient documentation

## 2014-01-29 DIAGNOSIS — I10 Essential (primary) hypertension: Secondary | ICD-10-CM | POA: Insufficient documentation

## 2014-01-29 DIAGNOSIS — S4290XA Fracture of unspecified shoulder girdle, part unspecified, initial encounter for closed fracture: Secondary | ICD-10-CM

## 2014-01-29 DIAGNOSIS — Z01812 Encounter for preprocedural laboratory examination: Secondary | ICD-10-CM | POA: Insufficient documentation

## 2014-01-29 DIAGNOSIS — Z01810 Encounter for preprocedural cardiovascular examination: Secondary | ICD-10-CM | POA: Insufficient documentation

## 2014-01-29 HISTORY — DX: Hypothyroidism, unspecified: E03.9

## 2014-01-29 HISTORY — DX: Anemia, unspecified: D64.9

## 2014-01-29 HISTORY — DX: Malignant (primary) neoplasm, unspecified: C80.1

## 2014-01-29 HISTORY — DX: Unspecified asthma, uncomplicated: J45.909

## 2014-01-29 LAB — CBC
HCT: 32.9 % — ABNORMAL LOW (ref 36.0–46.0)
Hemoglobin: 10.9 g/dL — ABNORMAL LOW (ref 12.0–15.0)
MCH: 31.8 pg (ref 26.0–34.0)
MCHC: 33.1 g/dL (ref 30.0–36.0)
MCV: 95.9 fL (ref 78.0–100.0)
Platelets: 264 10*3/uL (ref 150–400)
RBC: 3.43 MIL/uL — ABNORMAL LOW (ref 3.87–5.11)
RDW: 13.6 % (ref 11.5–15.5)
WBC: 9.4 10*3/uL (ref 4.0–10.5)

## 2014-01-29 LAB — ABO/RH: ABO/RH(D): O POS

## 2014-01-29 LAB — BASIC METABOLIC PANEL
BUN: 25 mg/dL — ABNORMAL HIGH (ref 6–23)
CALCIUM: 9.8 mg/dL (ref 8.4–10.5)
CO2: 25 mEq/L (ref 19–32)
Chloride: 98 mEq/L (ref 96–112)
Creatinine, Ser: 1.04 mg/dL (ref 0.50–1.10)
GFR calc Af Amer: 54 mL/min — ABNORMAL LOW (ref >90)
GFR, EST NON AFRICAN AMERICAN: 47 mL/min — AB (ref >90)
Glucose, Bld: 152 mg/dL — ABNORMAL HIGH (ref 70–99)
Potassium: 4.2 mEq/L (ref 3.7–5.3)
SODIUM: 139 meq/L (ref 137–147)

## 2014-01-29 LAB — TYPE AND SCREEN
ABO/RH(D): O POS
Antibody Screen: NEGATIVE

## 2014-01-29 NOTE — Progress Notes (Signed)
No pre-op orders in EPIC. Called Dr. Octavia Bruckner Murphy's office and left message on [facility] voicemail requesting orders to be put in [facility] prior to surgery date.

## 2014-01-29 NOTE — Pre-Procedure Instructions (Signed)
Casey Watts  01/29/2014   Your procedure is scheduled on:  Tuesday, February 04, 2014 at a time yet to be determined. Please call [phone] at 8 AM day of surgery to find time of surgery and what time to arrive. Should be 2 hours prior to surgery time.   Report to [facility] Entrance "A" Admitting Office   Call this number if you have problems the morning of surgery: [phone]   Remember:   Do not eat food or drink liquids after midnight Monday, 02/03/14.   Take these medicines the morning of surgery with A SIP OF WATER: lansoprazole (PREVACID), levothyroxine (SYNTHROID, LEVOTHROID). You may take your HYDROcodone-acetaminophen (NORCO/VICODIN) or your acetaminophen (TYLENOL) if needed.  Please stop your Celebrex and Vitamins as of today.   Do not wear jewelry, make-up or nail polish.  Do not wear lotions, powders, or perfumes. You may NOT wear deodorant.  Do not shave 48 hours prior to surgery.   Do not bring valuables to the hospital.  [facility] is not responsible                  for any belongings or valuables.               Contacts, dentures or bridgework may not be worn into surgery.  Leave suitcase in the car. After surgery it may be brought to your room.  For patients admitted to the hospital, discharge time is determined by your                treatment team.              Special Instructions:  - Preparing for Surgery  Before surgery, you can play an important role.  Because skin is not sterile, your skin needs to be as free of germs as possible.  You can reduce the number of germs on you skin by washing with CHG (chlorahexidine gluconate) soap before surgery.  CHG is an antiseptic cleaner which kills germs and bonds with the skin to continue killing germs even after washing.  Please DO NOT use if you have an allergy to CHG or antibacterial soaps.  If your skin becomes reddened/irritated stop using the CHG and inform your nurse when you arrive at Short  Stay.  Do not shave (including legs and underarms) for at least 48 hours prior to the first CHG shower.  You may shave your face.  Please follow these instructions carefully:   1.  Shower with CHG Soap the night before surgery and the                                morning of Surgery.  2.  If you choose to wash your hair, wash your hair first as usual with your       normal shampoo.  3.  After you shampoo, rinse your hair and body thoroughly to remove the                      Shampoo.  4.  Use CHG as you would any other liquid soap.  You can apply chg directly       to the skin and wash gently with scrungie or a clean washcloth.  5.  Apply the CHG Soap to your body ONLY FROM THE NECK DOWN.        Do not use on open wounds  or open sores.  Avoid contact with your eyes, ears, mouth and genitals (private parts).  Wash genitals (private parts) with your normal soap.  6.  Wash thoroughly, paying special attention to the area where your surgery        will be performed.  7.  Thoroughly rinse your body with warm water from the neck down.  8.  DO NOT shower/wash with your normal soap after using and rinsing off       the CHG Soap.  9.  Pat yourself dry with a clean towel.            10.  Wear clean pajamas.            11.  Place clean sheets on your bed the night of your first shower and do not        sleep with pets.  Day of Surgery  Do not apply any lotions/deodorants the morning of surgery.  Please wear clean clothes to the hospital/surgery center.     Please read over the following fact sheets that you were given: Pain Booklet, Coughing and Deep Breathing, Blood Transfusion Information and Surgical Site Infection Prevention

## 2014-01-29 NOTE — Progress Notes (Signed)
Pt's PCP is Dr. Osborne Casco

## 2014-01-30 ENCOUNTER — Other Ambulatory Visit (HOSPITAL_COMMUNITY): Payer: Medicare Other

## 2014-02-03 MED ORDER — CEFAZOLIN SODIUM-DEXTROSE 2-3 GM-% IV SOLR
2.0000 g | INTRAVENOUS | Status: DC
  Filled 2014-02-03: qty 50

## 2014-02-03 NOTE — Progress Notes (Signed)
Patient notified to arrive at 13:00

## 2014-02-04 ENCOUNTER — Encounter (HOSPITAL_COMMUNITY): Payer: Medicare Other | Admitting: Anesthesiology

## 2014-02-04 ENCOUNTER — Encounter (HOSPITAL_COMMUNITY): Payer: Self-pay | Admitting: Anesthesiology

## 2014-02-04 ENCOUNTER — Inpatient Hospital Stay (HOSPITAL_COMMUNITY): Payer: Medicare Other | Admitting: Anesthesiology

## 2014-02-04 ENCOUNTER — Inpatient Hospital Stay (HOSPITAL_COMMUNITY)
Admission: RE | Admit: 2014-02-04 | Discharge: 2014-02-06 | DRG: 483 | Disposition: A | Discharge status: Skilled Nursing Facility | Payer: Medicare Other | Source: Ambulatory Visit | Attending: Orthopedic Surgery | Admitting: Orthopedic Surgery

## 2014-02-04 ENCOUNTER — Encounter (HOSPITAL_COMMUNITY)
Admission: RE | Disposition: A | Discharge status: Skilled Nursing Facility | Payer: Self-pay | Source: Ambulatory Visit | Attending: Orthopedic Surgery

## 2014-02-04 DIAGNOSIS — W19XXXA Unspecified fall, initial encounter: Secondary | ICD-10-CM | POA: Diagnosis present

## 2014-02-04 DIAGNOSIS — S42209A Unspecified fracture of upper end of unspecified humerus, initial encounter for closed fracture: Secondary | ICD-10-CM | POA: Diagnosis present

## 2014-02-04 DIAGNOSIS — E119 Type 2 diabetes mellitus without complications: Secondary | ICD-10-CM | POA: Diagnosis present

## 2014-02-04 DIAGNOSIS — S42293A Other displaced fracture of upper end of unspecified humerus, initial encounter for closed fracture: Principal | ICD-10-CM | POA: Diagnosis present

## 2014-02-04 DIAGNOSIS — K219 Gastro-esophageal reflux disease without esophagitis: Secondary | ICD-10-CM | POA: Diagnosis present

## 2014-02-04 DIAGNOSIS — Z79899 Other long term (current) drug therapy: Secondary | ICD-10-CM

## 2014-02-04 DIAGNOSIS — D649 Anemia, unspecified: Secondary | ICD-10-CM | POA: Diagnosis present

## 2014-02-04 DIAGNOSIS — E039 Hypothyroidism, unspecified: Secondary | ICD-10-CM | POA: Diagnosis present

## 2014-02-04 DIAGNOSIS — M129 Arthropathy, unspecified: Secondary | ICD-10-CM | POA: Diagnosis present

## 2014-02-04 DIAGNOSIS — Z85828 Personal history of other malignant neoplasm of skin: Secondary | ICD-10-CM

## 2014-02-04 HISTORY — PX: REVERSE SHOULDER ARTHROPLASTY: SHX5054

## 2014-02-04 LAB — GLUCOSE, CAPILLARY
GLUCOSE-CAPILLARY: 115 mg/dL — AB (ref 70–99)
Glucose-Capillary: 169 mg/dL — ABNORMAL HIGH (ref 70–99)

## 2014-02-04 SURGERY — ARTHROPLASTY, SHOULDER, TOTAL, REVERSE
Anesthesia: Regional | Site: Shoulder | Laterality: Left

## 2014-02-04 MED ORDER — METOCLOPRAMIDE HCL 5 MG/ML IJ SOLN: 5.0000 mg | Freq: Three times a day (TID) | INTRAMUSCULAR | Status: DC | PRN

## 2014-02-04 MED ORDER — GLIMEPIRIDE 4 MG PO TABS
4.0000 mg | ORAL_TABLET | Freq: Two times a day (BID) | ORAL | Status: DC
  Administered 2014-02-05 – 2014-02-06 (×3): 4 mg via ORAL
  Filled 2014-02-04 (×5): qty 1

## 2014-02-04 MED ORDER — IRBESARTAN 150 MG PO TABS
150.0000 mg | ORAL_TABLET | Freq: Every day | ORAL | Status: DC
  Administered 2014-02-05 – 2014-02-06 (×2): 150 mg via ORAL
  Filled 2014-02-04 (×2): qty 1

## 2014-02-04 MED ORDER — PROPOFOL 10 MG/ML IV BOLUS
INTRAVENOUS | Status: DC | PRN
  Administered 2014-02-04: 70 mg via INTRAVENOUS

## 2014-02-04 MED ORDER — FENTANYL CITRATE 0.05 MG/ML IJ SOLN: 25.0000 ug | INTRAMUSCULAR | Status: DC | PRN

## 2014-02-04 MED ORDER — MORPHINE SULFATE 2 MG/ML IJ SOLN
2.0000 mg | INTRAMUSCULAR | Status: DC | PRN
  Administered 2014-02-05 (×2): 2 mg via INTRAVENOUS
  Filled 2014-02-04 (×2): qty 1

## 2014-02-04 MED ORDER — ONDANSETRON HCL 4 MG/2ML IJ SOLN: 4.0000 mg | Freq: Once | INTRAMUSCULAR | Status: DC | PRN

## 2014-02-04 MED ORDER — NEOSTIGMINE METHYLSULFATE 1 MG/ML IJ SOLN
INTRAMUSCULAR | Status: DC | PRN
  Administered 2014-02-04: 4 mg via INTRAVENOUS

## 2014-02-04 MED ORDER — ONDANSETRON HCL 4 MG/2ML IJ SOLN: 4.0000 mg | Freq: Four times a day (QID) | INTRAMUSCULAR | Status: DC | PRN

## 2014-02-04 MED ORDER — GLYCOPYRROLATE 0.2 MG/ML IJ SOLN
INTRAMUSCULAR | Status: DC | PRN
  Administered 2014-02-04: .7 mg via INTRAVENOUS
  Administered 2014-02-04: 0.2 mg via INTRAVENOUS

## 2014-02-04 MED ORDER — PANTOPRAZOLE SODIUM 20 MG PO TBEC
20.0000 mg | DELAYED_RELEASE_TABLET | Freq: Every day | ORAL | Status: DC
  Administered 2014-02-05 – 2014-02-06 (×2): 20 mg via ORAL
  Filled 2014-02-04 (×2): qty 1

## 2014-02-04 MED ORDER — METOCLOPRAMIDE HCL 10 MG PO TABS: 5.0000 mg | ORAL_TABLET | Freq: Three times a day (TID) | ORAL | Status: DC | PRN

## 2014-02-04 MED ORDER — FENTANYL CITRATE 0.05 MG/ML IJ SOLN
INTRAMUSCULAR | Status: DC | PRN
  Administered 2014-02-04: 25 ug via INTRAVENOUS

## 2014-02-04 MED ORDER — ARTIFICIAL TEARS OP OINT
TOPICAL_OINTMENT | OPHTHALMIC | Status: DC | PRN
  Administered 2014-02-04: 1 via OPHTHALMIC

## 2014-02-04 MED ORDER — LIDOCAINE HCL (CARDIAC) 20 MG/ML IV SOLN
INTRAVENOUS | Status: DC | PRN
  Administered 2014-02-04: 50 mg via INTRAVENOUS

## 2014-02-04 MED ORDER — LEVOTHYROXINE SODIUM 112 MCG PO TABS
112.0000 ug | ORAL_TABLET | Freq: Every day | ORAL | Status: DC
  Administered 2014-02-05 – 2014-02-06 (×2): 112 ug via ORAL
  Filled 2014-02-04 (×3): qty 1

## 2014-02-04 MED ORDER — PROPOFOL 10 MG/ML IV BOLUS
INTRAVENOUS | Status: AC
  Filled 2014-02-04: qty 20

## 2014-02-04 MED ORDER — MIDAZOLAM HCL 2 MG/2ML IJ SOLN
INTRAMUSCULAR | Status: AC
  Filled 2014-02-04: qty 2

## 2014-02-04 MED ORDER — DOCUSATE SODIUM 100 MG PO CAPS: 100.0000 mg | ORAL_CAPSULE | Freq: Two times a day (BID) | ORAL | Status: DC | PRN

## 2014-02-04 MED ORDER — CEFAZOLIN SODIUM 1-5 GM-% IV SOLN
1.0000 g | Freq: Four times a day (QID) | INTRAVENOUS | Status: AC
  Administered 2014-02-04 – 2014-02-05 (×3): 1 g via INTRAVENOUS
  Filled 2014-02-04 (×3): qty 50

## 2014-02-04 MED ORDER — MIDAZOLAM HCL 2 MG/2ML IJ SOLN: 1.0000 mg | Freq: Once | INTRAMUSCULAR | Status: DC

## 2014-02-04 MED ORDER — ASPIRIN 81 MG PO CHEW
81.0000 mg | CHEWABLE_TABLET | Freq: Every day | ORAL | Status: DC
  Administered 2014-02-05 – 2014-02-06 (×2): 81 mg via ORAL
  Filled 2014-02-04 (×2): qty 1

## 2014-02-04 MED ORDER — ONDANSETRON HCL 4 MG PO TABS: 4.0000 mg | ORAL_TABLET | Freq: Four times a day (QID) | ORAL | Status: DC | PRN

## 2014-02-04 MED ORDER — FENTANYL CITRATE 0.05 MG/ML IJ SOLN
INTRAMUSCULAR | Status: AC
  Filled 2014-02-04: qty 5

## 2014-02-04 MED ORDER — EPHEDRINE SULFATE 50 MG/ML IJ SOLN
INTRAMUSCULAR | Status: DC | PRN
  Administered 2014-02-04: 15 mg via INTRAVENOUS

## 2014-02-04 MED ORDER — BUPIVACAINE-EPINEPHRINE PF 0.5-1:200000 % IJ SOLN
INTRAMUSCULAR | Status: DC | PRN
  Administered 2014-02-04: 30 mL via PERINEURAL

## 2014-02-04 MED ORDER — METFORMIN HCL 500 MG PO TABS: 500.0000 mg | ORAL_TABLET | Freq: Once | ORAL | Status: DC

## 2014-02-04 MED ORDER — SODIUM CHLORIDE 0.9 % IR SOLN
Status: DC | PRN
  Administered 2014-02-04: 1000 mL

## 2014-02-04 MED ORDER — FENTANYL CITRATE 0.05 MG/ML IJ SOLN
100.0000 ug | Freq: Once | INTRAMUSCULAR | Status: AC
  Administered 2014-02-04: 50 ug via INTRAVENOUS
  Filled 2014-02-04: qty 2

## 2014-02-04 MED ORDER — HYDROCODONE-ACETAMINOPHEN 5-325 MG PO TABS
1.0000 | ORAL_TABLET | ORAL | Status: DC | PRN
  Administered 2014-02-05 – 2014-02-06 (×7): 2 via ORAL
  Filled 2014-02-04 (×7): qty 2

## 2014-02-04 MED ORDER — MIDAZOLAM HCL 5 MG/5ML IJ SOLN
INTRAMUSCULAR | Status: DC | PRN
  Administered 2014-02-04: 1 mg via INTRAVENOUS

## 2014-02-04 MED ORDER — LACTATED RINGERS IV SOLN: INTRAVENOUS | Status: DC

## 2014-02-04 MED ORDER — LACTATED RINGERS IV SOLN
INTRAVENOUS | Status: DC | PRN
  Administered 2014-02-04 (×3): via INTRAVENOUS

## 2014-02-04 MED ORDER — VALSARTAN-HYDROCHLOROTHIAZIDE 160-25 MG PO TABS: 1.0000 | ORAL_TABLET | Freq: Every day | ORAL | Status: DC

## 2014-02-04 MED ORDER — DEXTROSE 5 % IV SOLN
10.0000 mg | INTRAVENOUS | Status: DC | PRN
  Administered 2014-02-04: 50 ug/min via INTRAVENOUS

## 2014-02-04 MED ORDER — DEXTROSE-NACL 5-0.45 % IV SOLN
INTRAVENOUS | Status: AC
  Administered 2014-02-04: 23:00:00 via INTRAVENOUS

## 2014-02-04 MED ORDER — HYDROCHLOROTHIAZIDE 25 MG PO TABS
25.0000 mg | ORAL_TABLET | Freq: Every day | ORAL | Status: DC
  Administered 2014-02-05 – 2014-02-06 (×2): 25 mg via ORAL
  Filled 2014-02-04 (×2): qty 1

## 2014-02-04 MED ORDER — ONDANSETRON HCL 4 MG/2ML IJ SOLN
INTRAMUSCULAR | Status: DC | PRN
  Administered 2014-02-04: 4 mg via INTRAVENOUS

## 2014-02-04 MED ORDER — ROCURONIUM BROMIDE 100 MG/10ML IV SOLN
INTRAVENOUS | Status: DC | PRN
  Administered 2014-02-04: 30 mg via INTRAVENOUS

## 2014-02-04 SURGICAL SUPPLY — 68 items
BENZOIN TINCTURE PRP APPL 2/3 (GAUZE/BANDAGES/DRESSINGS) IMPLANT
BIT DRILL 170X2.5X (BIT) IMPLANT
BIT DRL 170X2.5X (BIT)
BLADE SAW SAG 73X25 THK (BLADE) ×2
BLADE SAW SGTL 73X25 THK (BLADE) ×2 IMPLANT
CAPT SHOULD DELTAXTEND CEM MOD ×4 IMPLANT
CEMENT HV SMART SET (Cement) ×8 IMPLANT
CLOSURE STERI-STRIP 1/2X4 (GAUZE/BANDAGES/DRESSINGS) ×1
CLOSURE WOUND 1/2 X4 (GAUZE/BANDAGES/DRESSINGS)
CLSR STERI-STRIP ANTIMIC 1/2X4 (GAUZE/BANDAGES/DRESSINGS) ×3 IMPLANT
COVER SURGICAL LIGHT HANDLE (MISCELLANEOUS) ×4 IMPLANT
DRAPE INCISE IOBAN 66X45 STRL (DRAPES) ×4 IMPLANT
DRAPE U-SHAPE 47X51 STRL (DRAPES) ×4 IMPLANT
DRILL 2.5 (BIT)
DRILL BIT 5/64 (BIT) ×4 IMPLANT
DRSG ADAPTIC 3X8 NADH LF (GAUZE/BANDAGES/DRESSINGS) ×4 IMPLANT
DRSG MEPILEX BORDER 4X8 (GAUZE/BANDAGES/DRESSINGS) ×4 IMPLANT
DRSG PAD ABDOMINAL 8X10 ST (GAUZE/BANDAGES/DRESSINGS) IMPLANT
DURAPREP 26ML APPLICATOR (WOUND CARE) ×4 IMPLANT
ELECT BLADE 4.0 EZ CLEAN MEGAD (MISCELLANEOUS) ×4
ELECT CAUTERY BLADE 6.4 (BLADE) ×4 IMPLANT
ELECT NEEDLE TIP 2.8 STRL (NEEDLE) IMPLANT
ELECT REM PT RETURN 9FT ADLT (ELECTROSURGICAL) ×4
ELECTRODE BLDE 4.0 EZ CLN MEGD (MISCELLANEOUS) ×2 IMPLANT
ELECTRODE REM PT RTRN 9FT ADLT (ELECTROSURGICAL) ×2 IMPLANT
GLOVE BIO SURGEON STRL SZ7.5 (GLOVE) ×8 IMPLANT
GLOVE BIOGEL PI IND STRL 8 (GLOVE) ×6 IMPLANT
GLOVE BIOGEL PI INDICATOR 8 (GLOVE) ×6
GLOVE BIOGEL PI ORTHO PRO SZ8 (GLOVE) ×4
GLOVE ORTHO TXT STRL SZ7.5 (GLOVE) ×4 IMPLANT
GLOVE PI ORTHO PRO STRL SZ8 (GLOVE) ×4 IMPLANT
GLOVE SURG ORTHO 8.0 STRL STRW (GLOVE) ×8 IMPLANT
GLOVE SURG SS PI 7.0 STRL IVOR (GLOVE) ×4 IMPLANT
GOWN L4 XXLG W/PAP TWL (GOWN DISPOSABLE) ×4 IMPLANT
GOWN STRL REUS W/ TWL LRG LVL3 (GOWN DISPOSABLE) ×2 IMPLANT
GOWN STRL REUS W/TWL LRG LVL3 (GOWN DISPOSABLE) ×2
KIT BASIN OR (CUSTOM PROCEDURE TRAY) ×4 IMPLANT
KIT ROOM TURNOVER OR (KITS) ×4 IMPLANT
MANIFOLD NEPTUNE II (INSTRUMENTS) ×4 IMPLANT
NDL SUT .5 MAYO 1.404X.05X (NEEDLE) ×2 IMPLANT
NEEDLE HYPO 25GX1X1/2 BEV (NEEDLE) IMPLANT
NEEDLE MAYO TAPER (NEEDLE) ×2
NS IRRIG 1000ML POUR BTL (IV SOLUTION) ×4 IMPLANT
PACK SHOULDER (CUSTOM PROCEDURE TRAY) ×4 IMPLANT
PAD ARMBOARD 7.5X6 YLW CONV (MISCELLANEOUS) ×8 IMPLANT
PIN GUIDE 1.2 (PIN) IMPLANT
PIN GUIDE GLENOPHERE 1.5MX300M (PIN) IMPLANT
PIN METAGLENE 2.5 (PIN) IMPLANT
RESTRICTOR CEMENT PE SZ 2 (Cement) ×4 IMPLANT
SLING ARM IMMOBILIZER LRG (SOFTGOODS) IMPLANT
SLING ARM IMMOBILIZER MED (SOFTGOODS) ×8 IMPLANT
SPONGE GAUZE 4X4 12PLY (GAUZE/BANDAGES/DRESSINGS) ×4 IMPLANT
SPONGE LAP 18X18 X RAY DECT (DISPOSABLE) ×4 IMPLANT
STRIP CLOSURE SKIN 1/2X4 (GAUZE/BANDAGES/DRESSINGS) IMPLANT
SUCTION FRAZIER TIP 10 FR DISP (SUCTIONS) ×4 IMPLANT
SUT FIBERWIRE #2 38 T-5 BLUE (SUTURE) ×20
SUT MNCRL AB 4-0 PS2 18 (SUTURE) ×4 IMPLANT
SUT VIC AB 0 CT1 27 (SUTURE) ×2
SUT VIC AB 0 CT1 27XBRD ANBCTR (SUTURE) ×2 IMPLANT
SUT VIC AB 2-0 CT1 27 (SUTURE) ×2
SUT VIC AB 2-0 CT1 TAPERPNT 27 (SUTURE) ×2 IMPLANT
SUTURE FIBERWR #2 38 T-5 BLUE (SUTURE) ×10 IMPLANT
SYR CONTROL 10ML LL (SYRINGE) IMPLANT
TOWEL OR 17X24 6PK STRL BLUE (TOWEL DISPOSABLE) ×4 IMPLANT
TOWEL OR 17X26 10 PK STRL BLUE (TOWEL DISPOSABLE) ×4 IMPLANT
TOWER CARTRIDGE SMART MIX (DISPOSABLE) ×4 IMPLANT
TRAY FOLEY CATH 16FRSI W/METER (SET/KITS/TRAYS/PACK) IMPLANT
WATER STERILE IRR 1000ML POUR (IV SOLUTION) ×4 IMPLANT

## 2014-02-04 NOTE — Anesthesia Procedure Notes (Signed)
Anesthesia Regional Block:  Interscalene brachial plexus block  Pre-Anesthetic Checklist: ,, timeout performed, Correct Patient, Correct Site, Correct Laterality, Correct Procedure, Correct Position, site marked, Risks and benefits discussed,  Surgical consent,  Pre-op evaluation,  At surgeon's request and post-op pain management  Laterality: Left  Prep: chloraprep       Needles:  Injection technique: Single-shot  Needle Type: Echogenic Stimulator Needle     Needle Length: 5cm 5 cm Needle Gauge: 22 and 22 G    Additional Needles:  Procedures: ultrasound guided (picture in chart) and nerve stimulator Interscalene brachial plexus block  Nerve Stimulator or Paresthesia:  Response: biceps flexion, 0.45 mA,   Additional Responses:   Narrative:  Start time: 02/04/2014 2:25 PM End time: 02/04/2014 2:37 PM Injection made incrementally with aspirations every 5 mL.  Performed by: Personally  Anesthesiologist: Dr Marcie Bal  Additional Notes: Functioning IV was confirmed and monitors were applied.  A 28mm 22ga Arrow echogenic stimulator needle was used. Sterile prep and drape,hand hygiene and sterile gloves were used.  Negative aspiration and negative test dose prior to incremental administration of local anesthetic. The patient tolerated the procedure well.  Ultrasound guidance: relevent anatomy identified, needle position confirmed, local anesthetic spread visualized around nerve(s), vascular puncture avoided.  Image printed for medical record.

## 2014-02-04 NOTE — Transfer of Care (Signed)
Immediate Anesthesia Transfer of Care Note  Patient: Casey Watts  Procedure(s) Performed: Procedure(s): REVERSE SHOULDER ARTHROPLASTY (Left)  Patient Location: PACU  Anesthesia Type:General  Level of Consciousness: awake and alert   Airway & Oxygen Therapy: Patient Spontanous Breathing and Patient connected to nasal cannula oxygen  Post-op Assessment: Report given to PACU RN and Post -op Vital signs reviewed and stable  Post vital signs: Reviewed and stable  Complications: No apparent anesthesia complications

## 2014-02-04 NOTE — Preoperative (Signed)
Beta Blockers   Reason not to administer Beta Blockers:Not Applicable 

## 2014-02-04 NOTE — Op Note (Signed)
02/04/2014  7:02 PM  PATIENT:  Casey Watts    PRE-OPERATIVE DIAGNOSIS:  LEFT PROXIMAL HUMERUS FX  POST-OPERATIVE DIAGNOSIS:  Same  PROCEDURE:  REVERSE SHOULDER ARTHROPLASTY  SURGEON:  Renette Butters, MD  ASSISTANT: Joya Gaskins OPA  ANESTHESIA:   Gen  PREOPERATIVE INDICATIONS:  Casey Watts is a  80 y.o. female with a diagnosis of LEFT PROXIMAL HUMERUS FX who failed conservative measures and elected for surgical management.    The risks benefits and alternatives were discussed with the patient preoperatively including but not limited to the risks of infection, bleeding, nerve injury, cardiopulmonary complications, the need for revision surgery, among others, and the patient was willing to proceed.  OPERATIVE IMPLANTS: Depuy reverse total shoulder  OPERATIVE FINDINGS: stable prosthesis  BLOOD LOSS: 161  COMPLICATIONS: none  TOURNIQUET TIME: none  OPERATIVE PROCEDURE:  Patient was identified in the preoperative holding area and site was marked by me She was transported to the operating theater and placed on the table in supine position taking care to pad all bony prominences. After a preincinduction time out anesthesia was induced. The Left upper extremity was prepped and draped in normal sterile fashion and a pre-incision timeout was performed. She received ancef for preoperative antibiotics.   She was positioned in the beachchair position again padding all bony prominences. The left upper extremity was prepped and draped in normal sterile fashion. I made a roughly 10 cm deltopectoral incision and incised the dermis. I spread down to the deltopectoral interval identified the cephalic vein taken laterally with the deltoid and protected from harm. Next identified the clavipectoral fascia and incise this just laterally to the conjoined tendon. I then placed a self-retaining retractor under the deltoid and conjoin tendon. Medially visible was the fracture at this point lesser  pes with some scabs on tach to a and a greater piece with comminution and the remainder of the cuff. There was some head attached to this.  I removed the remainder of the articular surface and placed #2 FiberWire this into the cuff around the greater and lesser tuberosities. I then retracted these and identified the proximal shaft of the humerus. This was also moved out of the way without positioning.  Next I placed posterior anterior and inferior neck retractors around the glenoid taking care to detect the axillary nerve. Or move the labrum from the glenoid after tenotomizing the biceps tendon to the pack insertion. I also removed the remainder the biceps tendon. Next I prepped the glenoid surface by placing a pin in the anatomic location St. inferior as possible below the center and then placed the centralizing pin. I reamed over this to shape the glenoid for the baseplate. I then selected a baseplate and selected a size 42 let us here. I placed a baseplate after reaming the a central peg and had a good fixation with this. I placed an anterior screw 18 nonlocking anterior posterior inferior had a good locking screw at 30 I did have to change is down from a 36. Anterior superior had a good locking screws well I elected not to place the posterior superior as 18 screw with penetrated posteriorly at the level of the suprascapular nerve. A good fixation and then placed in a centric let us tear inferiorly. Next I turned my attention to the humerus where sequentially reamed up to a size 10 and had good cortical chatter. I placed the trial and was happy with its fit really wanted to sit in 0 of  retroversion and fit very well here. I try with a 3 mm liner and stability was excellent side elected to allow this 0 of retroversion rather than pushing it more posterior.  I then irrigated the canal mixed cement and selected the size 10 cementable implant. I placed cement in the canal after a cement restrictor restrictor  and implanted the intramedullary reverse shoulder prosthesis and held in place with cement hardened. As very happy with the depth. I then opened a 3 mm poly-inserted this and reduce the shoulder stability was excellent at this time I thoroughly irrigated the wound I then brought the sutures abdomen placed in the implant and tied these to the greater and lesser tuberosities effectively reducing the cuff. I closed the rotator interval at the level of the tuberosities and was happy with our fixation on the tuberosities. I placed bone graft beneath and then closed the wound in layers using absorbable stitch. She was then placed a sterile dressing and taken the PACU in stable condition.  POST OPERATIVE PLAN: DVT px will consist of ambulation and ASA 81mg

## 2014-02-04 NOTE — Anesthesia Preprocedure Evaluation (Addendum)
Anesthesia Evaluation  Patient identified by MRN, date of birth, ID band Patient awake    Reviewed: Allergy & Precautions, H&P , NPO status , Patient's Chart, lab work & pertinent test results  Airway Mallampati: II  Neck ROM: full    Dental  (+) Teeth Intact, Dental Advisory Given, Poor Dentition   Pulmonary asthma ,    Pulmonary exam normal       Cardiovascular Exercise Tolerance: Good hypertension, Pt. on medications Rhythm:Regular Rate:Normal  29-Jan-2014  Normal sinus rhythm Normal ECG   Neuro/Psych    GI/Hepatic PUD, GERD-  Medicated,Esophageal stricture   Endo/Other  diabetes, Well Controlled, Type 2, Oral Hypoglycemic AgentsHypothyroidism   Renal/GU      Musculoskeletal  (+) Arthritis -, Osteoarthritis,    Abdominal   Peds  Hematology  (+) anemia ,   Anesthesia Other Findings   Reproductive/Obstetrics negative OB ROS                      Anesthesia Physical Anesthesia Plan  ASA: II  Anesthesia Plan: General and Regional   Post-op Pain Management:    Induction: Intravenous  Airway Management Planned: Oral ETT  Additional Equipment:   Intra-op Plan:   Post-operative Plan: Extubation in OR  Informed Consent: I have reviewed the patients History and Physical, chart, labs and discussed the procedure including the risks, benefits and alternatives for the proposed anesthesia with the patient or authorized representative who has indicated his/her understanding and acceptance.     Plan Discussed with: CRNA, Anesthesiologist and Surgeon  Anesthesia Plan Comments:         Anesthesia Quick Evaluation

## 2014-02-04 NOTE — Interval H&P Note (Signed)
History and Physical Interval Note:  02/04/2014 3:41 PM  Casey Watts  has presented today for surgery, with the diagnosis of LEFT PROXIMAL HUMERUS FX  The various methods of treatment have been discussed with the patient and family. After consideration of risks, benefits and other options for treatment, the patient has consented to  Procedure(s): SHOULDER HEMI-ARTHROPLASTY (Left) as a surgical intervention .  The patient's history has been reviewed, patient examined, no change in status, stable for surgery.  I have reviewed the patient's chart and labs.  Questions were answered to the patient's satisfaction.     Reedy Biernat, D

## 2014-02-04 NOTE — Anesthesia Postprocedure Evaluation (Signed)
  Anesthesia Post-op Note  Patient: Casey Watts  Procedure(s) Performed: Procedure(s): REVERSE SHOULDER ARTHROPLASTY (Left)  Patient Location: PACU  Anesthesia Type:General  Level of Consciousness: awake, alert , oriented and patient cooperative  Airway and Oxygen Therapy: Patient Spontanous Breathing and Patient connected to nasal cannula oxygen  Post-op Pain: mild  Post-op Assessment: Post-op Vital signs reviewed, Patient's Cardiovascular Status Stable, Respiratory Function Stable, Patent Airway, No signs of Nausea or vomiting and Pain level controlled  Post-op Vital Signs: Reviewed and stable  Complications: No apparent anesthesia complications

## 2014-02-04 NOTE — Progress Notes (Signed)
Care of pt assumed by [facility] Irena RN from [facility] [employer]	[place] RN

## 2014-02-04 NOTE — H&P (Signed)
ORTHOPAEDIC CONSULTATION  REQUESTING PHYSICIAN: Renette Butters, MD  Chief Complaint: Left Proximal humerus fracture  HPI: Casey Watts is a 80 y.o. female who complains of  Pain  Past Medical History  Diagnosis Date  . Diabetes mellitus   . Thyroid disease   . GERD (gastroesophageal reflux disease)   . Arthritis   . Hypertension   . Esophageal stricture   . Hypothyroidism   . Asthma     as a child  . Anemia   . Cancer     skin cancer on left leg   Past Surgical History  Procedure Laterality Date  . Appendectomy    . Cataract extraction Left   . Foot surgery Bilateral     Bunionectomy   History   Social History  . Marital Status: Widowed    Spouse Name: N/A    Number of Children: N/A  . Years of Education: N/A   Occupational History  . Retired    Social History Main Topics  . Smoking status: Never Smoker   . Smokeless tobacco: Never Used  . Alcohol Use: Yes     Comment: occasional  . Drug Use: No  . Sexual Activity: Not on file   Other Topics Concern  . Not on file   Social History Narrative  . No narrative on file   Family History  Problem Relation Age of Onset  . Colon cancer Neg Hx    No Known Allergies Prior to Admission medications   Medication Sig Start Date End Date Taking? Authorizing Provider  acetaminophen (TYLENOL) 500 MG tablet Take 500 mg by mouth daily as needed for mild pain.     Historical Provider, MD  celecoxib (CELEBREX) 200 MG capsule Take 200 mg by mouth daily.     Historical Provider, MD  DM-Doxylamine-Acetaminophen (DELSYM NIGHT TIME COUGH/COLD) 30-12.04-999 MG/30ML LIQD Take 10 mLs by mouth every 12 (twelve) hours as needed (cold/cough). Use as needed    Historical Provider, MD  docusate sodium (COLACE) 100 MG capsule Take 1 capsule (100 mg total) by mouth 2 (two) times daily as needed for mild constipation. 01/23/14   Saddie Benders. Ghim, MD  FA-Cyanocobalamin-B6-D-Ca (D 1000 PLUS) TABS Take 1 tablet by mouth daily.      Historical Provider, MD  glimepiride (AMARYL) 4 MG tablet Take 4 mg by mouth 2 (two) times daily.     Historical Provider, MD  HYDROcodone-acetaminophen (NORCO/VICODIN) 5-325 MG per tablet 1-2 tablets po q 6 hours prn moderate to severe pain 01/23/14   Saddie Benders. Ghim, MD  lansoprazole (PREVACID) 15 MG capsule Take 15 mg by mouth daily.      Historical Provider, MD  levothyroxine (SYNTHROID, LEVOTHROID) 112 MCG tablet Take 112 mcg by mouth daily.     Historical Provider, MD  SIMPLY SALINE NA Place 1 spray into both nostrils at bedtime as needed (nasal congestion).    Historical Provider, MD  valsartan-hydrochlorothiazide (DIOVAN-HCT) 160-25 MG per tablet Take 1 tablet by mouth daily.      Historical Provider, MD   No results found.  Positive ROS: All other systems have been reviewed and were otherwise negative with the exception of those mentioned in the HPI and as above.  Labs cbc No results found for this basename: WBC, HGB, HCT, PLT,  in the last 72 hours  Labs inflam No results found for this basename: ESR, CRP,  in the last 72 hours  Labs coag No results found for this basename:  INR, PT, PTT,  in the last 72 hours  No results found for this basename: NA, K, CL, CO2, GLUCOSE, BUN, CREATININE, CALCIUM,  in the last 72 hours  Physical Exam: There were no vitals filed for this visit. General: Alert, no acute distress Cardiovascular: No pedal edema Respiratory: No cyanosis, no use of accessory musculature GI: No organomegaly, abdomen is soft and non-tender Skin: No lesions in the area of chief complaint Neurologic: Sensation intact distally Psychiatric: Patient is competent for consent with normal mood and affect Lymphatic: No axillary or cervical lymphadenopathy  MUSCULOSKELETAL:  Pain with ROM, NVI Other extremities are atraumatic with painless ROM and NVI.  Assessment: Plan for Right shoulder reverse arthroplasty    Edmonia Lynch, D, MD Cell [phone]   02/04/2014 10:02 AM

## 2014-02-05 ENCOUNTER — Encounter (HOSPITAL_COMMUNITY): Payer: Self-pay | Admitting: General Practice

## 2014-02-05 ENCOUNTER — Inpatient Hospital Stay (HOSPITAL_COMMUNITY): Payer: Medicare Other

## 2014-02-05 LAB — GLUCOSE, CAPILLARY
Glucose-Capillary: 150 mg/dL — ABNORMAL HIGH (ref 70–99)
Glucose-Capillary: 201 mg/dL — ABNORMAL HIGH (ref 70–99)
Glucose-Capillary: 207 mg/dL — ABNORMAL HIGH (ref 70–99)

## 2014-02-05 NOTE — Evaluation (Signed)
Physical Therapy Evaluation Patient Details Name: Casey Watts MRN: [id number] DOB: 1924/07/02 Today's Date: 02/05/2014 Time: 8115-7262 PT Time Calculation (min): 19 min  PT Assessment / Plan / Recommendation History of Present Illness  s/p fall at home with resulting L proximal humerus fx. reverse TSA.   Clinical Impression  Pt limited with mobility due to shoulder and back pain from scoliosis.  She is also feeling very weak from having not eaten past 2 days but reports no appetite. Glucerna shake given to pt end of session. Pt ambulated 57' with min HHA, very stooped posture and unsteady. Pt will benefit from acute PT to increase mobility to increase independence and safety. Recommend d/c to SNF for rehab.    PT Assessment  Patient needs continued PT services    Follow Up Recommendations  SNF;Supervision/Assistance - 24 hour    Does the patient have the potential to tolerate intense rehabilitation      Barriers to Discharge Decreased caregiver support      Equipment Recommendations  None recommended by PT    Recommendations for Other Services     Frequency Min 3X/week    Precautions / Restrictions Precautions Precautions: Shoulder Type of Shoulder Precautions: ROM elbow wrist and hand only Shoulder Interventions: Shoulder sling/immobilizer;At all times;Off for dressing/bathing/exercises Required Braces or Orthoses: Sling Restrictions Weight Bearing Restrictions: Yes LUE Weight Bearing: Non weight bearing   Pertinent Vitals/Pain VSS      Mobility  Bed Mobility Overal bed mobility: Needs Assistance Bed Mobility: Rolling;Sidelying to Sit;Sit to Supine Rolling: Min assist Sidelying to sit: Min assist Sit to supine: Mod assist General bed mobility comments: vc's for pt to grasp left hand with right for rolling. Pt able to perform SL to sit transfer with only min A to right trunk with pushing away from bed. vc's for sequencing helped pt to be more independent. Mod A  needed for back into bed and scooting up to control mvmt and protect LUE Transfers Overall transfer level: Needs assistance Equipment used: 1 person hand held assist Transfers: Sit to/from Stand Sit to Stand: Min assist General transfer comment: slow with difficulty reaching full extended position. Family present reports that he posture is much more flexed today than normal. Pt with scoliosis. Ambulation/Gait Ambulation/Gait assistance: Min assist Ambulation Distance (Feet): 70 Feet Assistive device: 1 person hand held assist Gait Pattern/deviations: Decreased stride length;Shuffle;Trunk flexed Gait velocity: very slow Gait velocity interpretation: <1.8 ft/sec, indicative of risk for recurrent falls General Gait Details: pt began to have increasing back pain with ambulation. She reports her back is stiff from being in bed and she needs to move, however, ambulation also flares up pain. High risk for additional falls.    Exercises     PT Diagnosis: Difficulty walking;Acute pain;Generalized weakness;Abnormality of gait  PT Problem List: Decreased strength;Decreased activity tolerance;Decreased range of motion;Decreased balance;Decreased mobility;Decreased knowledge of use of DME;Decreased knowledge of precautions;Pain PT Treatment Interventions: DME instruction;Gait training;Functional mobility training;Therapeutic activities;Therapeutic exercise;Balance training;Neuromuscular re-education;Patient/family education     PT Goals(Current goals can be found in the care plan section) Acute Rehab PT Goals Patient Stated Goal: to get stronger PT Goal Formulation: With patient Time For Goal Achievement: 02/19/14 Potential to Achieve Goals: Good  Visit Information  Last PT Received On: 02/05/14 Assistance Needed: +1 History of Present Illness: s/p fall at home with resulting L proximal humerus fx. reverse TSA.        Prior Functioning  Home Living Family/patient expects to be discharged  to:: Skilled nursing  facility Living Arrangements: Alone Additional Comments: pt in agreement that she will need some rehab Prior Function Level of Independence: Independent with assistive device(s) Comments: ambulated with a cane sometimes Communication Communication: No difficulties Dominant Hand: Right    Cognition  Cognition Arousal/Alertness: Awake/alert Behavior During Therapy: WFL for tasks assessed/performed Overall Cognitive Status: Within Functional Limits for tasks assessed    Extremity/Trunk Assessment Upper Extremity Assessment Upper Extremity Assessment: Defer to OT evaluation Lower Extremity Assessment Lower Extremity Assessment: Generalized weakness Cervical / Trunk Assessment Cervical / Trunk Assessment: Kyphotic (scoliosis)   Balance Balance Overall balance assessment: Needs assistance Sitting-balance support: Feet supported;Single extremity supported Sitting balance-Leahy Scale: Fair Standing balance support: Single extremity supported;During functional activity Standing balance-Leahy Scale: Poor Standing balance comment: pt unsteady in standing General Comments General comments (skin integrity, edema, etc.): early stage pressure ulcer on buttocks per daughter  End of Session PT - End of Session Equipment Utilized During Treatment: Gait belt;Other (comment) (sling) Activity Tolerance: Patient limited by pain;Patient limited by fatigue Patient left: in bed;with call bell/phone within reach;with family/visitor present Nurse Communication: Mobility status  GP   Leighton Roach, [place]  [place], [place] 02/05/2014, 3:59 PM

## 2014-02-05 NOTE — Progress Notes (Signed)
Patient ID: Casey Watts, female   DOB: 11/21/1924, 80 y.o.   MRN: [id number]     Subjective:  Patient reports pain as mild.  Patient is uncomfortable but not much pain due to pre-op block that is still working.  Objective:   VITALS:   Filed Vitals:   02/04/14 2103 02/04/14 2115 02/04/14 2218 02/05/14 0604  BP: 128/55  135/59 138/60  Pulse:  85 88 86  Temp:  97.7 F (36.5 C) 97.8 F (36.6 C) 97.8 F (36.6 C)  TempSrc:      Resp:  21 20 18   SpO2:  98% 99% 98%    ABD soft Neurovascular intact Intact pulses distally Incision: dressing C/D/I and no drainage   Lab Results  Component Value Date   WBC 9.4 01/29/2014   HGB 10.9* 01/29/2014   HCT 32.9* 01/29/2014   MCV 95.9 01/29/2014   PLT 264 01/29/2014     Assessment/Plan: 1 Day Post-Op   Active Problems:   Proximal humeral fracture   Advance diet Up with therapy Sling at all times Okay for elbow, wrist, and hand motion Plan for DC on Friday to snf   Rande Brunt, [place] 02/05/2014, 8:44 AM   Edmonia Lynch MD [phone]

## 2014-02-05 NOTE — Progress Notes (Signed)
Clinical Social Work Department  BRIEF PSYCHOSOCIAL ASSESSMENT  Patient: Casey Watts  Account [id number]  Admit date: 02/04/14 Clinical Social Worker Rhea Pink, MSW Date/Time: 02/05/2014 3:14 PM Referred by: Physician Date Referred:  Referred for   SNF Placement   Other Referral:  Interview type: Patient's daughter Casey Watts [phone]) at patient's request Other interview type: PSYCHOSOCIAL DATA  Living Status: Family Admitted from facility:  Level of care:  Primary support name: Casey Watts Primary support relationship to patient: Daughter Degree of support available:  Strong and vested  CURRENT CONCERNS  Current Concerns   Post-Acute Placement   Other Concerns:  SOCIAL WORK ASSESSMENT / PLAN  CSW met with pt and patient asked CSW to speak with her daughter Casey Watts re: PT recommendation for SNF.   Pt lives with family  CSW explained placement process and answered questions.   Pt's daughter reported no preference st this time. CSW encouraged patient's daughter to go to Medicare.gov to look at the facilities ratings   CSW completed FL2 and initiated SNF search.     Assessment/plan status: Information/Referral to [employer]  Other assessment/ plan:  Information/referral to community resources:  SNF     PATIENT'S/FAMILY'S RESPONSE TO PLAN OF CARE:  Pt  And patient's daughter report they are agreeable to ST SNF in order to increase strength and independence with mobility prior to returning home  Pt's daughter asked specific questions about going to a facility because she stated "I'm not sure how this process works. CSW answered all questions. Pt's daughter verbalized understanding of placement process and appreciation for CSW assist.   Rhea Pink, MSW, [place]

## 2014-02-05 NOTE — Care Management Note (Signed)
CARE MANAGEMENT NOTE 02/05/2014  Patient:  Casey Watts, Casey Watts   Account Number:  [id number]  Date Initiated:  02/05/2014  Documentation initiated by:  Ricki Miller  Subjective/Objective Assessment:   80 yr old female s/p left reverse shoulder arthroplasty.     Action/Plan:   Patient is for shortterm rehab at [facility]. Social Worker is aware.Has spoken with patient's daughter.   Anticipated DC Date:  02/06/2014   Anticipated DC Plan:  SKILLED NURSING FACILITY  In-house referral  Clinical Social Worker      DC Planning Services  CM consult      Choice offered to / List presented to:             Status of service:  Completed, signed off Medicare Important Message given?   (If response is "NO", the following Medicare IM given date fields will be blank) Date Medicare IM given:   Date Additional Medicare IM given:    Discharge Disposition:  [facility]

## 2014-02-05 NOTE — Progress Notes (Addendum)
Patient wants me to speak with patient's daughter before any decisions are made.  Rhea Pink, MSW, [place]

## 2014-02-05 NOTE — Progress Notes (Signed)
Occupational Therapy Evaluation Patient Details Name: Casey Watts MRN: [id number] DOB: 1924/05/07 Today's Date: 02/05/2014 Time: 6967-8938 OT Time Calculation (min): 32 min  OT Assessment / Plan / Recommendation History of present illness s/p fall at home with resulting L proximal humerus fx. reverse TSA.    Clinical Impression   PTA, pt lived alone and was independent with ADL and mod I with mobility @ [place] level. Pt fell at home because she "lost her balance". Began L elbow wrist and hand ROM and sling mgnt. Pt will need SNF for rehab to facilitate return home. Pt is excellent candidate for ALF after rehab. Will continue to follow to facilitate D/C to SNF.     OT Assessment  Patient needs continued OT Services    Follow Up Recommendations  SNF;Supervision/Assistance - 24 hour    Barriers to Discharge Decreased caregiver support    Equipment Recommendations  None recommended by OT    Recommendations for Other Services    Frequency  Min 3X/week    Precautions / Restrictions Precautions Precautions: Shoulder Type of Shoulder Precautions: ROM elbow wrist and hand only Shoulder Interventions: Shoulder sling/immobilizer;At all times;Off for dressing/bathing/exercises Required Braces or Orthoses: Sling Restrictions Weight Bearing Restrictions: Yes LUE Weight Bearing: Non weight bearing   Pertinent Vitals/Pain C/o pain L shoulder. Did not rate. IV pain meds given prior to session.    ADL  Eating/Feeding: Set up Where Assessed - Eating/Feeding: Chair Grooming: Moderate assistance Where Assessed - Grooming: Supported sitting Upper Body Bathing: Moderate assistance Where Assessed - Upper Body Bathing: Supported sitting Lower Body Bathing: Moderate assistance Where Assessed - Lower Body Bathing: Supported sit to stand Upper Body Dressing: Maximal assistance Where Assessed - Upper Body Dressing: Supported sitting Lower Body Dressing: Moderate assistance Where Assessed - Lower  Body Dressing: Supported sit to Lobbyist: Minimal assistance Toileting - Clothing Manipulation and Hygiene: Moderate assistance Equipment Used: Gait belt Transfers/Ambulation Related to ADLs: min A. HHA, uses [place]ora ADL Comments: Began educating pt on donning/doffing slind and correct ositioning of LUE in sitting.    OT Diagnosis: Generalized weakness;Acute pain  OT Problem List: Decreased strength;Decreased range of motion;Decreased activity tolerance;Decreased knowledge of use of DME or AE;Decreased knowledge of precautions;Impaired UE functional use;Pain OT Treatment Interventions: Self-care/ADL training;Therapeutic exercise;DME and/or AE instruction;Therapeutic activities;Patient/family education;Balance training   OT Goals(Current goals can be found in the care plan section) Acute Rehab OT Goals Patient Stated Goal: to get stronger OT Goal Formulation: With patient Time For Goal Achievement: 02/19/14 Potential to Achieve Goals: Good  Visit Information  Last OT Received On: 02/05/14 Assistance Needed: +1 History of Present Illness: s/p fall at home with resulting L proximal humerus fx. reverse TSA.        Prior [place] expects to be discharged to:: Skilled nursing facility Living Arrangements: Alone Prior Function Level of Independence: Independent with assistive device(s) Communication Communication: No difficulties Dominant Hand: Right         Vision/Perception     Cognition  Cognition Arousal/Alertness: Awake/alert Behavior During Therapy: WFL for tasks assessed/performed Overall Cognitive Status: Within Functional Limits for tasks assessed Memory: Decreased short-term memory    Extremity/Trunk Assessment Upper Extremity Assessment Upper Extremity Assessment: LUE deficits/detail LUE Deficits / Details: lacks @ 20 degrees full elbow extension LUE: Unable to fully assess due to immobilization LUE Sensation:   (appears intact) LUE Coordination: decreased fine motor;decreased gross motor Lower Extremity Assessment Lower Extremity Assessment: Generalized weakness Cervical / Trunk  Assessment Cervical / Trunk Assessment: Kyphotic (forward  head)     Mobility Bed Mobility Overal bed mobility: Needs Assistance Bed Mobility: Sidelying to Sit Sidelying to sit: Min assist General bed mobility comments: vc to roll toward R side Transfers Overall transfer level: Needs assistance Equipment used: 1 person hand held assist Transfers: Sit to/from Stand Sit to Stand: Min assist General transfer comment: slow. unsteady. at risk for falls     Exercise Other Exercises Other Exercises: L elbow flex/ext/A/AAROM 10 reps Other Exercises: L sup.Pron/ 10-15 Other Exercises: L full fist/opn. 15 reps   Balance Balance Overall balance assessment: Needs assistance Sitting-balance support: Feet supported;Single extremity supported Sitting balance-Leahy Scale: Fair Standing balance support: During functional activity;Single extremity supported Standing balance-Leahy Scale: Poor General Comments General comments (skin integrity, edema, etc.): apparently has skin breakdown on backside  - from home   End of Session OT - End of Session Equipment Utilized During Treatment: Gait belt Activity Tolerance: Patient tolerated treatment well Patient left: in chair;with call bell/phone within reach Nurse Communication: Mobility status;Patient requests pain meds  GO     Javia Dillow,H[place] 02/05/2014, 11:58 AM [facility], OTR/L  [phone] 02/05/2014

## 2014-02-05 NOTE — Clinical Social Work Placement (Addendum)
Clinical Social Work Department  CLINICAL SOCIAL WORK PLACEMENT NOTE  Patient: Casey Watts Account Number: [id number]  Admit date: 01/31/13  Clinical Social Worker: Rhea Pink LCSWA Date/time: 02/05/2014 11:30 AM  Clinical Social Work is seeking post-discharge placement for this patient at the following level of care: SKILLED NURSING (*CSW will update this form in Epic as items are completed)  02/05/2014 Patient/family provided with [facility] Department of Clinical Social Work's list of facilities offering this level of care within the geographic area requested by the patient (or if unable, by the patient's family).  02/05/2014 Patient/family informed of their freedom to choose among providers that offer the needed level of care, that participate in Medicare, Medicaid or managed care program needed by the patient, have an available bed and are willing to accept the patient.  3/4/2015Patient/family informed of [facility]' ownership interest in [facility], as well as of the fact that they are under no obligation to receive care at this facility.  PASARR submitted to EDS on 02/06/2014 PASARR number received from EDS on 02/06/2014 FL2 transmitted to all facilities in geographic area requested by pt/family on 02/05/2014  FL2 transmitted to all facilities within larger geographic area on  Patient informed that his/her managed care company has contracts with or will negotiate with certain facilities, including the following:  Patient/family informed of bed offers received: 02/06/2014 Patient chooses bed at [facility] Physician recommends and patient chooses bed at  Patient to be transferred to on 02/06/2014 Patient to be transferred to facility by [facility] The following physician request were entered in Epic:  Additional Comments:

## 2014-02-05 NOTE — Progress Notes (Signed)
CSW has yet to speak with patient's daughter but all clinicals have been faxed to [facility] for Auth.  Rhea Pink, MSW, [place]

## 2014-02-05 NOTE — Progress Notes (Signed)
I participated in the care of this patient and agree with the above history, physical and evaluation. I performed a review of the history and a physical exam as detailed   Tennie Grussing Daniel [place] Mccuen MD  

## 2014-02-06 ENCOUNTER — Encounter: Payer: Self-pay | Admitting: *Deleted

## 2014-02-06 LAB — GLUCOSE, CAPILLARY: GLUCOSE-CAPILLARY: 124 mg/dL — AB (ref 70–99)

## 2014-02-06 MED ORDER — ONDANSETRON HCL 4 MG PO TABS: 4.0000 mg | ORAL_TABLET | Freq: Three times a day (TID) | ORAL | Status: DC | PRN

## 2014-02-06 MED ORDER — DOCUSATE SODIUM 100 MG PO CAPS: 100.0000 mg | ORAL_CAPSULE | Freq: Two times a day (BID) | ORAL | Status: DC

## 2014-02-06 MED ORDER — ASPIRIN 81 MG PO TABS: 81.0000 mg | ORAL_TABLET | Freq: Every day | ORAL | Status: DC

## 2014-02-06 MED ORDER — HYDROCODONE-ACETAMINOPHEN 5-325 MG PO TABS: 2.0000 | ORAL_TABLET | ORAL | Status: DC | PRN

## 2014-02-06 NOTE — Progress Notes (Signed)
Patient ID: Casey Watts, female   DOB: 09/14/1924, 80 y.o.   MRN: [id number]     Subjective:  Patient reports pain as mild.  Patient denies any CP or SOB  Objective:   VITALS:   Filed Vitals:   02/05/14 1406 02/05/14 1540 02/05/14 2044 02/06/14 0100  BP: 93/48  90/33   Pulse: 80 76 48   Temp: 97.6 F (36.4 C)  98.3 F (36.8 C)   TempSrc: Oral  Oral   Resp: 18  20   Height:    5' (1.524 m)  Weight:    60.827 kg (134 lb 1.6 oz)  SpO2: 96% 97% 94%     ABD soft Sensation intact distally Dorsiflexion/Plantar flexion intact Incision: dressing C/D/I and no drainage   Lab Results  Component Value Date   WBC 9.4 01/29/2014   HGB 10.9* 01/29/2014   HCT 32.9* 01/29/2014   MCV 95.9 01/29/2014   PLT 264 01/29/2014     Assessment/Plan: 2 Days Post-Op   Active Problems:   Proximal humeral fracture   Advance diet Up with therapy Plan for discharge tomorrow Sling at all times NWB left upper ext Dry dressing PRN   Remonia Richter 02/06/2014, 7:25 AM   Edmonia Lynch MD [phone]

## 2014-02-06 NOTE — Progress Notes (Signed)
Physical Therapy Treatment Patient Details Name: Casey Watts MRN: [id number] DOB: 1924/10/14 Today's Date: 02/06/2014 Time: 9983-3825 PT Time Calculation (min): 13 min  PT Assessment / Plan / Recommendation  History of Present Illness s/p fall at home with resulting L proximal humerus fx. reverse TSA.    PT Comments   Patient progressing well. States she get tired very easily now with activity. Adjusted immobilizer as it was not secured properly. Continue to recommend SNF for ongoing Physical Therapy.     Follow Up Recommendations  SNF;Supervision/Assistance - 24 hour     Does the patient have the potential to tolerate intense rehabilitation     Barriers to Discharge        Equipment Recommendations  None recommended by PT    Recommendations for Other Services    Frequency Min 3X/week   Progress towards PT Goals Progress towards PT goals: Progressing toward goals  Plan Current plan remains appropriate    Precautions / Restrictions Precautions Precautions: Shoulder Type of Shoulder Precautions: ROM elbow wrist and hand only Shoulder Interventions: Shoulder sling/immobilizer;At all times;Off for dressing/bathing/exercises Required Braces or Orthoses: Sling Restrictions LUE Weight Bearing: Non weight bearing   Pertinent Vitals/Pain Complained of ongoing knee pain with ambulation. patient repositioned for comfort     Mobility  Bed Mobility General bed mobility comments: Patient sitting up in recliner before and after session.  Transfers Overall transfer level: Needs assistance Equipment used: 1 person hand held assist Sit to Stand: Min assist General transfer comment: Cues for postioning and safe hand placement. Better job at getting into upright position this session Ambulation/Gait Ambulation/Gait assistance: Min assist Ambulation Distance (Feet): 100 Feet Assistive device: 1 person hand held assist Gait velocity: decreased Gait velocity interpretation: <1.8  ft/sec, indicative of risk for recurrent falls General Gait Details: Cues for posture. Patient began to have knee pain with ambulation.     Exercises     PT Diagnosis:    PT Problem List:   PT Treatment Interventions:     PT Goals (current goals can now be found in the care plan section)    Visit Information  Last PT Received On: 02/06/14 Assistance Needed: +1 History of Present Illness: s/p fall at home with resulting L proximal humerus fx. reverse TSA.     Subjective Data      Cognition  Cognition Arousal/Alertness: Awake/alert Behavior During Therapy: WFL for tasks assessed/performed Overall Cognitive Status: Within Functional Limits for tasks assessed    Balance     End of Session PT - End of Session Equipment Utilized During Treatment: Gait belt Activity Tolerance: Patient limited by fatigue Patient left: in chair;with call bell/phone within reach Nurse Communication: Mobility status   GP     Jacqualyn Posey 02/06/2014, 9:18 AM 02/06/2014 Jacqualyn Posey PTA [phone] pager [phone] office

## 2014-02-06 NOTE — Discharge Summary (Signed)
Physician Discharge Summary  Patient ID: Casey Watts MRN: [id number] DOB/AGE: Jan 24, 1924 80 y.o.  Admit date: 02/04/2014 Discharge date: 02/06/2014  Admission Diagnoses:  <principal problem not specified>  Discharge Diagnoses:  Active Problems:   Proximal humeral fracture   Past Medical History  Diagnosis Date  . Thyroid disease   . GERD (gastroesophageal reflux disease)   . Arthritis   . Hypertension   . Esophageal stricture   . Hypothyroidism   . Asthma     as a child  . Anemia   . Cancer     skin cancer on left leg  . Diabetes mellitus     TYPE 2    Surgeries: Procedure(s): REVERSE SHOULDER ARTHROPLASTY on 02/04/2014   Consultants (if any):    Discharged Condition: Improved  Hospital Course: RENNAE FERRAIOLO is an 80 y.o. female who was admitted 02/04/2014 with a diagnosis of <principal problem not specified> and went to the operating room on 02/04/2014 and underwent the above named procedures.    She was given perioperative antibiotics:  Anti-infectives   Start     Dose/Rate Route Frequency Ordered Stop   02/04/14 2300  ceFAZolin (ANCEF) IVPB 1 g/50 mL premix     1 g 100 mL/hr over 30 Minutes Intravenous Every 6 hours 02/04/14 2214 02/05/14 1039   02/04/14 0600  ceFAZolin (ANCEF) IVPB 2 g/50 mL premix  Status:  Discontinued     2 g 100 mL/hr over 30 Minutes Intravenous On call to O.R. 02/03/14 1244 02/04/14 2133    .  She was given sequential compression devices, early ambulation, and ASA 81 for DVT prophylaxis.  She benefited maximally from the hospital stay and there were no complications.    Recent vital signs:  Filed Vitals:   02/05/14 2044  BP: 90/33  Pulse: 48  Temp: 98.3 F (36.8 C)  Resp: 20    Recent laboratory studies:  Lab Results  Component Value Date   HGB 10.9* 01/29/2014   Lab Results  Component Value Date   WBC 9.4 01/29/2014   PLT 264 01/29/2014   No results found for this basename: INR   Lab Results  Component Value Date    NA 139 01/29/2014   K 4.2 01/29/2014   CL 98 01/29/2014   CO2 25 01/29/2014   BUN 25* 01/29/2014   CREATININE 1.04 01/29/2014   GLUCOSE 152* 01/29/2014    Discharge Medications:     Medication List         acetaminophen 500 MG tablet  Commonly known as:  TYLENOL  Take 500 mg by mouth daily as needed for mild pain.     aspirin 81 MG tablet  Take 1 tablet (81 mg total) by mouth daily.     celecoxib 200 MG capsule  Commonly known as:  CELEBREX  Take 200 mg by mouth daily.     D 1000 PLUS Tabs  Generic drug:  FA-Cyanocobalamin-B6-D-Ca  Take 1 tablet by mouth daily.     DELSYM NIGHT TIME COUGH/COLD 30-12.04-999 MG/30ML Liqd  Generic drug:  DM-Doxylamine-Acetaminophen  Take 10 mLs by mouth every 12 (twelve) hours as needed (cold/cough). Use as needed     docusate sodium 100 MG capsule  Commonly known as:  COLACE  Take 1 capsule (100 mg total) by mouth 2 (two) times daily as needed for mild constipation.     docusate sodium 100 MG capsule  Commonly known as:  COLACE  Take 1 capsule (100 mg total) by mouth 2 (  two) times daily. Continue this while taking narcotics to help with bowel movements     glimepiride 4 MG tablet  Commonly known as:  AMARYL  Take 4 mg by mouth 2 (two) times daily.     HYDROcodone-acetaminophen 5-325 MG per tablet  Commonly known as:  NORCO  Take 2 tablets by mouth every 4 (four) hours as needed.     lansoprazole 15 MG capsule  Commonly known as:  PREVACID  Take 15 mg by mouth daily.     levothyroxine 112 MCG tablet  Commonly known as:  SYNTHROID, LEVOTHROID  Take 112 mcg by mouth daily.     metFORMIN 500 MG tablet  Commonly known as:  GLUCOPHAGE  Take 500 mg by mouth once.     ondansetron 4 MG tablet  Commonly known as:  ZOFRAN  Take 1 tablet (4 mg total) by mouth every 8 (eight) hours as needed for nausea.     SIMPLY SALINE NA  Place 1 spray into both nostrils at bedtime as needed (nasal congestion).     valsartan-hydrochlorothiazide 160-25  MG per tablet  Commonly known as:  DIOVAN-HCT  Take 1 tablet by mouth daily.        Diagnostic Studies: Dg Chest 1 View  01/29/2014   CLINICAL DATA:  Preop, hypertension  EXAM: CHEST - 1 VIEW  COMPARISON:  04/04/2005  FINDINGS: Cardiomediastinal silhouette is stable. Mild elevation of the right hemidiaphragm again noted. No acute infiltrate or pleural effusion. No pulmonary edema. Probable chronic mild interstitial prominence. There is comminuted displaced fracture of proximal left humerus.  IMPRESSION: No active disease. Comminuted displaced fracture of proximal left humerus.   Electronically Signed   By: Lahoma Crocker M.D.   On: 01/29/2014 09:51   Dg Wrist Complete Left  01/23/2014   CLINICAL DATA:  Wrist pain status post fall  EXAM: LEFT WRIST - COMPLETE 3+ VIEW  COMPARISON:  None.  FINDINGS: The bones of the left wrist are diffusely osteopenic. There is no evidence of an acute fracture. There is mild narrowing of the radiocarpal joint. There is calcification of the triangular fibrocartilage. No carpal bone fracture is demonstrated. The distal radius and ulna appear intact. The metacarpals appear intact.  IMPRESSION: There are degenerative changes of the left wrist. There is no evidence of an acute fracture.   Electronically Signed   By: David  Martinique   On: 01/23/2014 16:36   Ct Shoulder Left Wo Contrast  01/28/2014   CLINICAL DATA:  Left shoulder fracture.  EXAM: CT OF THE LEFT SHOULDER WITHOUT CONTRAST; 3-DIMENSIONAL CT IMAGE RENDERING ON INDEPENDENT WORKSTATION  TECHNIQUE: Multidetector CT imaging was performed according to the standard protocol. Multiplanar CT image reconstructions were also generated.; 3-dimensional CT images were rendered by post-processing of the original CT data on an independent workstation. The 3-dimensional CT images were interpreted and findings were reported in the accompanying complete CT report for this study  COMPARISON:  Radiographs 01/23/2014.  FINDINGS: Complex,  severely comminuted and displaced fractures of the humeral head and neck as demonstrated on the radiographs. The humeral head is fractured into 3 Large components. The most posterior part of the humeral head articulates with the glenoid. The other two large fragments of the articular surface are dislocated anteriorly and inferiorly. The greater tuberosity fracture is elevated likely due to the pull of the rotator cuff tendons.  The glenoid is intact. The [facility] joint is intact. Chondrocalcinosis is noted. No scapular fracture. The visualized left ribs are intact. The left  lung demonstrates emphysematous changes but no worrisome lesions.  IMPRESSION: Severely comminuted and complex humeral head and neck fractures as described above.   Electronically Signed   By: Kalman Jewels M.D.   On: 01/28/2014 14:09   Ct 3d Independent Darreld Mclean  01/28/2014   CLINICAL DATA:  Left shoulder fracture.  EXAM: CT OF THE LEFT SHOULDER WITHOUT CONTRAST; 3-DIMENSIONAL CT IMAGE RENDERING ON INDEPENDENT WORKSTATION  TECHNIQUE: Multidetector CT imaging was performed according to the standard protocol. Multiplanar CT image reconstructions were also generated.; 3-dimensional CT images were rendered by post-processing of the original CT data on an independent workstation. The 3-dimensional CT images were interpreted and findings were reported in the accompanying complete CT report for this study  COMPARISON:  Radiographs 01/23/2014.  FINDINGS: Complex, severely comminuted and displaced fractures of the humeral head and neck as demonstrated on the radiographs. The humeral head is fractured into 3 Large components. The most posterior part of the humeral head articulates with the glenoid. The other two large fragments of the articular surface are dislocated anteriorly and inferiorly. The greater tuberosity fracture is elevated likely due to the pull of the rotator cuff tendons.  The glenoid is intact. The [facility] joint is intact. Chondrocalcinosis is  noted. No scapular fracture. The visualized left ribs are intact. The left lung demonstrates emphysematous changes but no worrisome lesions.  IMPRESSION: Severely comminuted and complex humeral head and neck fractures as described above.   Electronically Signed   By: Kalman Jewels M.D.   On: 01/28/2014 14:09   Dg Shoulder Left  02/05/2014   CLINICAL DATA:  Postop  EXAM: LEFT SHOULDER - 2+ VIEW  COMPARISON:  None.  FINDINGS: Single portable view of the left shoulder submitted. There is left shoulder prosthesis in anatomic alignment.  IMPRESSION: Left shoulder prosthesis in anatomic alignment.   Electronically Signed   By: Lahoma Crocker M.D.   On: 02/05/2014 09:37   Dg Shoulder Left  01/23/2014   CLINICAL DATA:  Left shoulder pain status post fall.  EXAM: LEFT SHOULDER - 2+ VIEW  COMPARISON:  None.  FINDINGS: The images are limited due to patient's inability to assume positioning desired. There is a fracture through the humeral head and proximal neck. The glenoid cannot be adequately assessed. The [facility] joint is grossly intact. The observed portions of the distal left clavicle appear normal.  IMPRESSION: The patient has sustained an acute comminuted displaced fracture of the humeral head and neck but detail is limited. CT scanning is recommended.   Electronically Signed   By: David  Martinique   On: 01/23/2014 16:35    Disposition: 01-Home or Self Care        Follow-up Information   Follow up with Renette Butters, MD. (as scheduled)    Specialty:  Orthopedic Surgery   Contact information:   [address] [place] [address] [phone]        Signed: Edmonia Lynch, D 02/06/2014, 7:14 AM

## 2014-02-06 NOTE — Progress Notes (Signed)
Occupational Therapy Treatment Patient Details Name: Casey Watts MRN: [id number] DOB: 11-Jan-1924 Today's Date: 02/06/2014 Time: 6222-9798 OT Time Calculation (min): 20 min  OT Assessment / Plan / Recommendation  History of present illness s/p fall at home with resulting L proximal humerus fx. reverse TSA.    OT comments  Pt presents with decreased strength and continues to require Min A with handheld x 1 person for sit<>stand transfers. Pt daughter was assisting pt to toilet when therapist entered room. Therapist then assisted with sit>stand transfer and pt ambulated to bed. Educated in purpose of and completed LUE exercises for elbow, wrist, and hand. Pt states that she feels ready for SNF. Pt requested that daughter be called when she is ready for discharge. OT recommends SNF for ongoing OT   Follow Up Recommendations  SNF;Supervision/Assistance - 24 hour    Barriers to Discharge   Does not have 24/7 supervision/assistance at home.     Equipment Recommendations  None recommended by OT       Frequency Min 3X/week   Progress towards OT Goals Progress towards OT goals: Not progressing toward goals - comment (pt presents with decreased strength and requires min A for transfers)  Plan Discharge plan remains appropriate    Precautions / Restrictions Precautions Precautions: Shoulder Type of Shoulder Precautions: ROM elbow wrist and hand only Shoulder Interventions: Shoulder sling/immobilizer;At all times;Off for dressing/bathing/exercises Required Braces or Orthoses: Sling Restrictions Weight Bearing Restrictions: Yes LUE Weight Bearing: Non weight bearing       ADL  Toilet Transfer: Minimal assistance Toilet Transfer Method: Sit to stand Toilet Transfer Equipment: Comfort height toilet Transfers/Ambulation Related to ADLs: Entered room to find pt's daughter assisting pt to the toilet. pt required hand held min A for sit>stand from toilet and ambulation to the bed.       OT  Goals(current goals can now be found in the care plan section)    Visit Information  Last OT Received On: 02/06/14 Assistance Needed: +1 History of Present Illness: s/p fall at home with resulting L proximal humerus fx. reverse TSA.           Cognition  Cognition Arousal/Alertness: Awake/alert Behavior During Therapy: WFL for tasks assessed/performed Overall Cognitive Status: Within Functional Limits for tasks assessed    Mobility  Bed Mobility Overal bed mobility: Needs Assistance Bed Mobility: Sit to Supine Sit to supine: Mod assist (to scoot up and position in bed) Transfers Overall transfer level: Needs assistance Equipment used: 1 person hand held assist Transfers: Sit to/from Stand Sit to Stand: Min assist    Exercises  General Exercises - Upper Extremity Elbow Flexion: AAROM;Left;10 reps;Seated;AROM (in bed) Elbow Extension: AAROM;AROM;Left;10 reps;Seated (in bed) Wrist Flexion: AROM;Left;10 reps;Seated (in bed) Wrist Extension: AROM;Left;10 reps;Seated (in bed) Digit Composite Flexion: AROM;Left;10 reps;Seated Composite Extension: AROM;Left;10 reps;Seated      End of Session OT - End of Session Activity Tolerance: Patient tolerated treatment well Patient left: in bed;with call bell/phone within reach       Juluis Rainier [phone] 02/06/2014, 1:47 PM

## 2014-02-06 NOTE — Progress Notes (Signed)
I participated in the care of this patient and agree with the above history, physical and evaluation. I performed a review of the history and a physical exam as detailed   Everitt Wenner Daniel Emmeline Winebarger MD  

## 2014-02-06 NOTE — Progress Notes (Signed)
Clinical social worker assisted with patient discharge to skilled nursing facility, [facility].  CSW addressed all family questions and concerns. CSW copied chart and added all important documents. CSW also set up patient transportation with [facility]. Clinical Social Worker will sign off for now as social work intervention is no longer needed.   Armonie Staten, MSW, LCSWA [phone] 

## 2014-02-06 NOTE — Discharge Instructions (Signed)
Stay in your sling full time  Keep your dressing on until follow up and your incision covered

## 2014-02-06 NOTE — Progress Notes (Signed)
Inpatient Diabetes Program Recommendations  AACE/ADA: New Consensus Statement on Inpatient Glycemic Control (2013)  Target Ranges:  Prepandial:   less than 140 mg/dL      Peak postprandial:   less than 180 mg/dL (1-2 hours)      Critically ill patients:  140 - 180 mg/dL   MD, please order CBG monitoring for patient since she is on oral HYPOglycemics.  Thank you  Raoul Pitch BSN, RN,CDE Inpatient Diabetes Coordinator [phone] (team pager)

## 2014-02-07 LAB — GLUCOSE, CAPILLARY: Glucose-Capillary: 128 mg/dL — ABNORMAL HIGH (ref 70–99)

## 2014-02-10 ENCOUNTER — Non-Acute Institutional Stay (SKILLED_NURSING_FACILITY): Payer: Medicare Other | Admitting: Adult Health

## 2014-02-10 DIAGNOSIS — E119 Type 2 diabetes mellitus without complications: Secondary | ICD-10-CM

## 2014-02-10 DIAGNOSIS — K5900 Constipation, unspecified: Secondary | ICD-10-CM

## 2014-02-10 DIAGNOSIS — S42209A Unspecified fracture of upper end of unspecified humerus, initial encounter for closed fracture: Secondary | ICD-10-CM

## 2014-02-10 DIAGNOSIS — I10 Essential (primary) hypertension: Secondary | ICD-10-CM

## 2014-02-10 DIAGNOSIS — K219 Gastro-esophageal reflux disease without esophagitis: Secondary | ICD-10-CM

## 2014-02-10 DIAGNOSIS — E039 Hypothyroidism, unspecified: Secondary | ICD-10-CM

## 2014-02-12 ENCOUNTER — Non-Acute Institutional Stay (SKILLED_NURSING_FACILITY): Payer: Medicare Other | Admitting: Adult Health

## 2014-02-12 DIAGNOSIS — E119 Type 2 diabetes mellitus without complications: Secondary | ICD-10-CM

## 2014-02-13 ENCOUNTER — Encounter: Payer: Self-pay | Admitting: Adult Health

## 2014-02-13 ENCOUNTER — Encounter: Payer: Self-pay | Admitting: Internal Medicine

## 2014-02-13 ENCOUNTER — Non-Acute Institutional Stay (SKILLED_NURSING_FACILITY): Payer: Medicare Other | Admitting: Internal Medicine

## 2014-02-13 DIAGNOSIS — E039 Hypothyroidism, unspecified: Secondary | ICD-10-CM | POA: Insufficient documentation

## 2014-02-13 DIAGNOSIS — K219 Gastro-esophageal reflux disease without esophagitis: Secondary | ICD-10-CM | POA: Insufficient documentation

## 2014-02-13 DIAGNOSIS — E119 Type 2 diabetes mellitus without complications: Secondary | ICD-10-CM

## 2014-02-13 DIAGNOSIS — S42209A Unspecified fracture of upper end of unspecified humerus, initial encounter for closed fracture: Secondary | ICD-10-CM

## 2014-02-13 DIAGNOSIS — I10 Essential (primary) hypertension: Secondary | ICD-10-CM | POA: Insufficient documentation

## 2014-02-13 DIAGNOSIS — K5900 Constipation, unspecified: Secondary | ICD-10-CM | POA: Insufficient documentation

## 2014-02-13 NOTE — Assessment & Plan Note (Signed)
Continue levothyroxine 

## 2014-02-13 NOTE — Assessment & Plan Note (Signed)
Prophylaxed with prevacis 15 mg daily

## 2014-02-13 NOTE — Assessment & Plan Note (Signed)
Was d/c on glucophage and glipizide but that has been stopped because pt has had hypoglycemic episodes; pt on SSI now; pt on ARB

## 2014-02-13 NOTE — Progress Notes (Signed)
MRN: [id number] Name: Casey Watts  Sex: female Age: 80 y.o. DOB: Mar 29, 1924  [place] #: [facility] Facility/Room: 1006-2 Level Of Care: SNF Provider: Inocencio Homes D Emergency Contacts: Extended Emergency Contact Information Primary Emergency Contact: Fitting,Ray Address: [address]          [place],  [address] [place] Phone: [phone] Work Phone: [phone] Mobile Phone: [phone] Relation: Son Secondary Emergency Contact: Fonnie Birkenhead [place] Phone: [phone] Work Phone: [phone] Mobile Phone: [phone] Relation: Relative  Code Status: DNR  Allergies: Review of patient's allergies indicates no known allergies.  Chief Complaint  Patient presents with  . nursing home admission    HPI: Patient is 80 y.o. female who fell and fractured her shoulder requiring surgery who is admitted for OT/PT.  Past Medical History  Diagnosis Date  . Thyroid disease   . GERD (gastroesophageal reflux disease)   . Arthritis   . Hypertension   . Esophageal stricture   . Hypothyroidism   . Asthma     as a child  . Anemia   . Cancer     skin cancer on left leg  . Diabetes mellitus     TYPE 2    Past Surgical History  Procedure Laterality Date  . Appendectomy    . Cataract extraction Left   . Foot surgery Bilateral     Bunionectomy  . Reverse shoulder arthroplasty Left 02/04/2014    DR MURPHY  . Eye surgery    . Reverse shoulder arthroplasty Left 02/04/2014    Procedure: REVERSE SHOULDER ARTHROPLASTY;  Surgeon: Renette Butters, MD;  Location: [facility];  Service: Orthopedics;  Laterality: Left;      Medication List       This list is accurate as of: 02/13/14  4:55 PM.  Always use your most recent med list.               acetaminophen 500 MG tablet  Commonly known as:  TYLENOL  Take 500 mg by mouth daily as needed for mild pain.     aspirin 81 MG tablet  Take 1 tablet (81 mg total) by mouth daily.     D  1000 PLUS Tabs  Generic drug:  FA-Cyanocobalamin-B6-D-Ca  Take 1 tablet by mouth daily.     DELSYM NIGHT TIME COUGH/COLD 30-12.04-999 MG/30ML Liqd  Generic drug:  DM-Doxylamine-Acetaminophen  Take 10 mLs by mouth every 12 (twelve) hours as needed (cold/cough). Use as needed     docusate sodium 100 MG capsule  Commonly known as:  COLACE  Take 1 capsule (100 mg total) by mouth 2 (two) times daily as needed for mild constipation.     HYDROcodone-acetaminophen 5-325 MG per tablet  Commonly known as:  NORCO  Take 2 tablets by mouth every 4 (four) hours as needed.     insulin lispro 100 UNIT/ML injection  Commonly known as:  HUMALOG  Inject 5 Units into the skin 2 (two) times daily. For CBG>250     lansoprazole 15 MG capsule  Commonly known as:  PREVACID  Take 15 mg by mouth daily.     levothyroxine 112 MCG tablet  Commonly known as:  SYNTHROID, LEVOTHROID  Take 112 mcg by mouth daily.     ondansetron 4 MG tablet  Commonly known as:  ZOFRAN  Take 1 tablet (4 mg total) by mouth every 8 (eight) hours as needed for nausea.     SIMPLY SALINE NA  Place 1 spray into  both nostrils at bedtime as needed (nasal congestion).     valsartan-hydrochlorothiazide 160-25 MG per tablet  Commonly known as:  DIOVAN-HCT  Take 1 tablet by mouth daily.        No orders of the defined types were placed in this encounter.     There is no immunization history on file for this patient.  History  Substance Use Topics  . Smoking status: Never Smoker   . Smokeless tobacco: Never Used  . Alcohol Use: Yes     Comment: occasional    Family history is noncontributory    Review of Systems  DATA OBTAINED: from patient GENERAL: Feels well no fevers, fatigue, appetite changes SKIN: No itching, rash or wounds EYES: No eye pain, redness, discharge EARS: No earache, tinnitus, change in hearing NOSE: No congestion, drainage or bleeding  MOUTH/THROAT: No mouth or tooth pain, No sore throat, No  difficulty chewing or swallowing  RESPIRATORY: No cough, wheezing, SOB CARDIAC: No chest pain, palpitations, lower extremity edema  GI: No abdominal pain, No N/V/D or constipation, No heartburn or reflux  GU: No dysuria, frequency or urgency, or incontinence  MUSCULOSKELETAL: No unrelieved bone/joint pain NEUROLOGIC: No headache, dizziness or focal weakness PSYCHIATRIC: No overt anxiety or sadness. Sleeps well. No behavior issue.   Filed Vitals:   02/13/14 1646  BP: 132/63  Pulse: 70  Temp: 98.3 F (36.8 C)  Resp: 18    Physical Exam  GENERAL APPEARANCE: Alert, conversant. Appropriately groomed. No acute distress.  SKIN: No diaphoresis rash, or wounds HEAD: Normocephalic, atraumatic  EYES: Conjunctiva/lids clear. Pupils round, reactive. EOMs intact.  EARS: External exam WNL, canals clear. Hearing grossly normal.  NOSE: No deformity or discharge.  MOUTH/THROAT: Lips w/o lesions. RESPIRATORY: Breathing is even, unlabored. Lung sounds are clear   CARDIOVASCULAR: Heart RRR no murmurs, rubs or gallops. No peripheral edema.  GASTROINTESTINAL: Abdomen is soft, non-tender, not distended w/ normal bowel soun GENITOURINARY: Bladder non tender, not distended  MUSCULOSKELETAL: L shoulder in sling NEUROLOGIC: Oriented X3. Cranial nerves 2-12 grossly intact. Moves all extremities no tremor. PSYCHIATRIC: Mood and affect appropriate to situation, no behavioral issues  Patient Active Problem List   Diagnosis Date Noted  . GERD (gastroesophageal reflux disease) 02/13/2014  . Constipation 02/13/2014  . Hypothyroidism 02/13/2014  . Hypertension 02/13/2014  . Proximal humeral fracture 02/04/2014  . Stricture and stenosis of esophagus 02/28/2011  . Chronic duodenal ulcer without mention of hemorrhage, perforation, or obstruction 02/28/2011  . DM 01/19/2011    CBC    Component Value Date/Time   WBC 9.4 01/29/2014 1525   RBC 3.43* 01/29/2014 1525   HGB 10.9* 01/29/2014 1525   HCT 32.9*  01/29/2014 1525   PLT 264 01/29/2014 1525   MCV 95.9 01/29/2014 1525    CMP     Component Value Date/Time   NA 139 01/29/2014 0930   K 4.2 01/29/2014 0930   CL 98 01/29/2014 0930   CO2 25 01/29/2014 0930   GLUCOSE 152* 01/29/2014 0930   BUN 25* 01/29/2014 0930   CREATININE 1.04 01/29/2014 0930   CALCIUM 9.8 01/29/2014 0930   GFRNONAA 47* 01/29/2014 0930   GFRAA 54* 01/29/2014 0930    Assessment and Plan  Proximal humeral fracture S/p L shoulder arthroplasty 02/04/2014; ASA 81 mg for DVT prophylaxis;  GERD (gastroesophageal reflux disease) Prophylaxed with prevacis 15 mg daily  DM Was d/c on glucophage and glipizide but that has been stopped because pt has had hypoglycemic episodes; pt on SSI now; pt  on ARB  Hypothyroidism Continue levothyroxine  Hypertension maintaine on Diovan-HCT    Hennie Duos, MD

## 2014-02-13 NOTE — Assessment & Plan Note (Signed)
maintaine on Diovan-HCT

## 2014-02-13 NOTE — Progress Notes (Signed)
Patient ID: MEGGAN DHALIWAL, female   DOB: 09/21/1924, 80 y.o.   MRN: [id number]               PROGRESS NOTE  DATE: 02/10/2014  FACILITY: Nursing Home Location: [facility]  LEVEL OF CARE: SNF (31)  Acute Visit  CHIEF COMPLAINT:  Follow-up Hospitalization  HISTORY OF PRESENT ILLNESS: This is an 80 year old female who has been admitted to [facility] on 02/06/14 from [facility] with Proximal Humerus Fracture S/P Reverse shoulder arthroplasty. She has been admitted for a short-term rehabilitation.  REASSESSMENT OF ONGOING PROBLEM(S):  HTN: Pt 's HTN remains stable.  Denies CP, sob, DOE, pedal edema, headaches, dizziness or visual disturbances.  No complications from the medications currently being used.  Last BP :138/62  DM:pt's DM remains stable.  Pt denies polyuria, polydipsia, polyphagia, changes in vision or hypoglycemic episodes.  No complications noted from the medication presently being used.  CBGs this morning 46, 47  GERD: pt's GERD is stable.  Denies ongoing heartburn, abd. Pain, nausea or vomiting.  Currently on a PPI & tolerates it without any adverse reactions.  PAST MEDICAL HISTORY : Reviewed.  No changes.  CURRENT MEDICATIONS: Reviewed per [facility]  REVIEW OF SYSTEMS:  GENERAL: no change in appetite, no fatigue, no weight changes, no fever, chills or weakness RESPIRATORY: no cough, SOB, DOE, wheezing, hemoptysis CARDIAC: no chest pain, edema or palpitations GI: no abdominal pain, diarrhea, constipation, heart burn, nausea or vomiting  PHYSICAL EXAMINATION  GENERAL: no acute distress, normal body habitus EYES: conjunctivae normal, sclerae normal, normal eye lids NECK: supple, trachea midline, no neck masses, no thyroid tenderness, no thyromegaly LYMPHATICS: no LAN in the neck, no supraclavicular LAN RESPIRATORY: breathing is even & unlabored, BS CTAB CARDIAC: RRR, no murmur,no extra heart sounds, no edema GI: abdomen soft, normal BS, no  masses, no tenderness, no hepatomegaly, no splenomegaly PSYCHIATRIC: the patient is alert & oriented to person, affect & behavior appropriate  LABS/RADIOLOGY:  Labs reviewed: Basic Metabolic Panel:  Recent Labs  01/29/14 0930  NA 139  K 4.2  CL 98  CO2 25  GLUCOSE 152*  BUN 25*  CREATININE 1.04  CALCIUM 9.8    CBC:  Recent Labs  01/29/14 1525  WBC 9.4  HGB 10.9*  HCT 32.9*  MCV 95.9  PLT 264    CBG:  Recent Labs  02/05/14 2157 02/06/14 0700 02/06/14 1133  GLUCAP 207* 124* 128*     ASSESSMENT/PLAN:  Proximal humerus fracture S/P reverse shoulder arthroplasty - for rehabilitation Diabetes Mellitus, type 2 -  Discontinue Glimepiride and continue Metformin; CBG Q D Constipation - stable; continue Colace GERD - continue Prevacid Hypothyroidism - continue Synthroid Hypertension - well-controlled    CPT CODE: 33295  Monina Vargas - NP [facility] [phone]

## 2014-02-13 NOTE — Progress Notes (Signed)
Patient ID: Casey Watts, female   DOB: [birth year], 80 y.o.   MRN: [id number]                PROGRESS NOTE  DATE: 02/12/14  FACILITY: Nursing Home Location: [place] [facility]  LEVEL OF CARE: SNF (31)  Acute Visit  CHIEF COMPLAINT:  Manage Hypoglycemia  HISTORY OF PRESENT ILLNESS: This is an 80 year old female who had fingerstick blood sugar 42 this morning. No change in LOC. Her Glimepiride was recently discontinued. She is currently on Metformin for Diabetes Mellitus.     PAST MEDICAL HISTORY : Reviewed.  No changes.  CURRENT MEDICATIONS: Reviewed per [facility]  REVIEW OF SYSTEMS:  GENERAL: no change in appetite, no fatigue, no weight changes, no fever, chills or weakness RESPIRATORY: no cough, SOB, DOE, wheezing, hemoptysis CARDIAC: no chest pain, edema or palpitations GI: no abdominal pain, diarrhea, constipation, heart burn, nausea or vomiting  PHYSICAL EXAMINATION  GENERAL: no acute distress, normal body habitus NECK: supple, trachea midline, no neck masses, no thyroid tenderness, no thyromegaly LYMPHATICS: no LAN in the neck, no supraclavicular LAN RESPIRATORY: breathing is even & unlabored, BS CTAB CARDIAC: RRR, no murmur,no extra heart sounds, no edema GI: abdomen soft, normal BS, no masses, no tenderness, no hepatomegaly, no splenomegaly PSYCHIATRIC: the patient is alert & oriented to person, affect & behavior appropriate  LABS/RADIOLOGY:  Labs reviewed: Basic Metabolic Panel:  Recent Labs  01/29/14 0930  NA 139  K 4.2  CL 98  CO2 25  GLUCOSE 152*  BUN 25*  CREATININE 1.04  CALCIUM 9.8    CBC:  Recent Labs  01/29/14 1525  WBC 9.4  HGB 10.9*  HCT 32.9*  MCV 95.9  PLT 264    CBG:  Recent Labs  02/05/14 2157 02/06/14 0700 02/06/14 1133  GLUCAP 207* 124* 128*     ASSESSMENT/PLAN:  Diabetes Mellitus, type 2 -  Discontinue Metformin; CBG BID and start Humalog 5 units SQ for CBG >250   CPT CODE: 36644   Monina Vargas -  NP [facility] [phone]

## 2014-02-13 NOTE — Assessment & Plan Note (Signed)
S/p L shoulder arthroplasty 02/04/2014; ASA 81 mg for DVT prophylaxis;

## 2014-03-20 ENCOUNTER — Encounter: Payer: Self-pay | Admitting: Adult Health

## 2014-03-20 ENCOUNTER — Other Ambulatory Visit: Payer: Self-pay | Admitting: *Deleted

## 2014-03-20 ENCOUNTER — Non-Acute Institutional Stay (SKILLED_NURSING_FACILITY): Payer: Medicare Other | Admitting: Adult Health

## 2014-03-20 DIAGNOSIS — K219 Gastro-esophageal reflux disease without esophagitis: Secondary | ICD-10-CM

## 2014-03-20 DIAGNOSIS — I10 Essential (primary) hypertension: Secondary | ICD-10-CM

## 2014-03-20 DIAGNOSIS — K5900 Constipation, unspecified: Secondary | ICD-10-CM

## 2014-03-20 DIAGNOSIS — E119 Type 2 diabetes mellitus without complications: Secondary | ICD-10-CM

## 2014-03-20 DIAGNOSIS — E039 Hypothyroidism, unspecified: Secondary | ICD-10-CM

## 2014-03-20 DIAGNOSIS — S42209A Unspecified fracture of upper end of unspecified humerus, initial encounter for closed fracture: Secondary | ICD-10-CM

## 2014-03-20 MED ORDER — HYDROCODONE-ACETAMINOPHEN 5-325 MG PO TABS: ORAL_TABLET | ORAL | Status: DC

## 2014-03-20 NOTE — Progress Notes (Signed)
Patient ID: Casey Watts, female   DOB: Mar 31, 1924, 80 y.o.   MRN: [id number]          PROGRESS NOTE  DATE: 03/20/14  FACILITY: Nursing Home Location: [facility]  LEVEL OF CARE: SNF (31)  Acute Visit  CHIEF COMPLAINT:  Discharge Notes  HISTORY OF PRESENT ILLNESS: This is an 80 year old female who is for discharge to an [place]. She has been admitted to [facility] on 02/06/14 from [facility] with Left Proximal Humerus Fracture S/P Reverse left shoulder arthroplasty. Patient was admitted to this facility for short-term rehabilitation after the patient's recent hospitalization.  Patient has completed SNF rehabilitation and therapy has cleared the patient for discharge.   REASSESSMENT OF ONGOING PROBLEM(S):  HTN: Pt 's HTN remains stable.  Denies CP, sob, DOE, pedal edema, headaches, dizziness or visual disturbances.  No complications from the medications currently being used.  Last BP :128/76  DM:pt's DM remains stable.  Pt denies polyuria, polydipsia, polyphagia, changes in vision or hypoglycemic episodes.  No complications noted from the medication presently being used.   3/15 hgbA1c 6.6  HYPOTHYROIDISM: The hypothyroidism remains stable. No complications noted from the medications presently being used.  The patient denies fatigue or constipation.     PAST MEDICAL HISTORY : Reviewed.  No changes.  CURRENT MEDICATIONS: Reviewed per [facility]  REVIEW OF SYSTEMS:  GENERAL: no change in appetite, no fatigue, no weight changes, no fever, chills or weakness RESPIRATORY: no cough, SOB, DOE, wheezing, hemoptysis CARDIAC: no chest pain, edema or palpitations GI: no abdominal pain, diarrhea, constipation, heart burn, nausea or vomiting  PHYSICAL EXAMINATION  GENERAL: no acute distress, normal body habitus EYES: conjunctivae normal, sclerae normal, normal eye lids NECK: supple, trachea midline, no neck masses, no thyroid tenderness, no  thyromegaly RESPIRATORY: breathing is even & unlabored, BS CTAB CARDIAC: RRR, no murmur,no extra heart sounds, no edema GI: abdomen soft, normal BS, no masses, no tenderness, no hepatomegaly, no splenomegaly EXTREMITIES: moves all 4 extremities PSYCHIATRIC: the patient is alert & oriented to person, affect & behavior appropriate  LABS/RADIOLOGY: 02/13/14 WBC 10.3 hemoglobin 10.0 hematocrit 33.0 hemoglobin A1c 6.6 sodium 137 potassium 4.2 glucose 93 BUN 19 creatinine 1.0 calcium 9.6  Labs reviewed: Basic Metabolic Panel:  Recent Labs  01/29/14 0930  NA 139  K 4.2  CL 98  CO2 25  GLUCOSE 152*  BUN 25*  CREATININE 1.04  CALCIUM 9.8    CBC:  Recent Labs  01/29/14 1525  WBC 9.4  HGB 10.9*  HCT 32.9*  MCV 95.9  PLT 264    CBG:  Recent Labs  02/05/14 2157 02/06/14 0700 02/06/14 1133  GLUCAP 207* 124* 128*     ASSESSMENT/PLAN:  Proximal humerus fracture S/P reverse shoulder arthroplasty - for continued rehabilitation till discharged to ALF Diabetes Mellitus, type 2 -  Recently discontinued Metformin and Glimepiride; started on Humalog for CBG >250 Constipation - stable; continue Colace GERD - continue Prevacid Hypothyroidism - continue Synthroid Hypertension - well-controlled; continue LosartanHCT  I have filled out patient's discharge paperwork and written prescriptions.    Total discharge time: Less than 30 minutes Discharge time involved coordination of the discharge process with Education officer, museum, nursing staff and therapy department. Medical justification for home health services verified.  CPT CODE: 31517  Seth Bake - NP [facility] [phone]

## 2014-03-20 NOTE — Telephone Encounter (Signed)
[facility] 

## 2014-06-10 ENCOUNTER — Ambulatory Visit: Payer: Medicare Other | Attending: Orthopedic Surgery

## 2014-06-10 DIAGNOSIS — Z96619 Presence of unspecified artificial shoulder joint: Secondary | ICD-10-CM | POA: Insufficient documentation

## 2014-06-10 DIAGNOSIS — E119 Type 2 diabetes mellitus without complications: Secondary | ICD-10-CM | POA: Diagnosis not present

## 2014-06-10 DIAGNOSIS — IMO0001 Reserved for inherently not codable concepts without codable children: Secondary | ICD-10-CM | POA: Insufficient documentation

## 2014-06-10 DIAGNOSIS — R5381 Other malaise: Secondary | ICD-10-CM | POA: Insufficient documentation

## 2014-06-10 DIAGNOSIS — M810 Age-related osteoporosis without current pathological fracture: Secondary | ICD-10-CM | POA: Diagnosis not present

## 2014-06-10 DIAGNOSIS — M25619 Stiffness of unspecified shoulder, not elsewhere classified: Secondary | ICD-10-CM | POA: Insufficient documentation

## 2014-06-10 DIAGNOSIS — M6281 Muscle weakness (generalized): Secondary | ICD-10-CM | POA: Insufficient documentation

## 2014-06-16 ENCOUNTER — Ambulatory Visit: Payer: Medicare Other

## 2014-06-16 DIAGNOSIS — IMO0001 Reserved for inherently not codable concepts without codable children: Secondary | ICD-10-CM | POA: Diagnosis not present

## 2014-06-18 ENCOUNTER — Ambulatory Visit: Payer: Medicare Other | Admitting: Physical Therapy

## 2014-06-18 DIAGNOSIS — IMO0001 Reserved for inherently not codable concepts without codable children: Secondary | ICD-10-CM | POA: Diagnosis not present

## 2014-06-23 ENCOUNTER — Ambulatory Visit: Payer: Medicare Other | Admitting: Physical Therapy

## 2014-06-25 ENCOUNTER — Ambulatory Visit: Payer: Medicare Other | Admitting: Physical Therapy

## 2014-06-25 DIAGNOSIS — IMO0001 Reserved for inherently not codable concepts without codable children: Secondary | ICD-10-CM | POA: Diagnosis not present

## 2014-06-30 ENCOUNTER — Ambulatory Visit: Payer: Medicare Other | Admitting: Physical Therapy

## 2014-07-02 ENCOUNTER — Ambulatory Visit: Payer: Medicare Other | Admitting: Physical Therapy

## 2014-07-07 ENCOUNTER — Ambulatory Visit: Payer: Medicare Other | Attending: Orthopedic Surgery | Admitting: Physical Therapy

## 2014-07-07 DIAGNOSIS — Z96619 Presence of unspecified artificial shoulder joint: Secondary | ICD-10-CM | POA: Diagnosis not present

## 2014-07-07 DIAGNOSIS — E119 Type 2 diabetes mellitus without complications: Secondary | ICD-10-CM | POA: Insufficient documentation

## 2014-07-07 DIAGNOSIS — IMO0001 Reserved for inherently not codable concepts without codable children: Secondary | ICD-10-CM | POA: Diagnosis present

## 2014-07-07 DIAGNOSIS — R5381 Other malaise: Secondary | ICD-10-CM | POA: Diagnosis not present

## 2014-07-07 DIAGNOSIS — M6281 Muscle weakness (generalized): Secondary | ICD-10-CM | POA: Diagnosis not present

## 2014-07-07 DIAGNOSIS — M25619 Stiffness of unspecified shoulder, not elsewhere classified: Secondary | ICD-10-CM | POA: Diagnosis not present

## 2014-07-07 DIAGNOSIS — M810 Age-related osteoporosis without current pathological fracture: Secondary | ICD-10-CM | POA: Diagnosis not present

## 2014-07-09 ENCOUNTER — Ambulatory Visit: Payer: Medicare Other

## 2014-07-09 DIAGNOSIS — IMO0001 Reserved for inherently not codable concepts without codable children: Secondary | ICD-10-CM | POA: Diagnosis not present

## 2014-07-21 ENCOUNTER — Ambulatory Visit: Payer: Medicare Other | Admitting: Physical Therapy

## 2014-07-21 DIAGNOSIS — IMO0001 Reserved for inherently not codable concepts without codable children: Secondary | ICD-10-CM | POA: Diagnosis not present

## 2014-07-23 ENCOUNTER — Ambulatory Visit: Payer: Medicare Other

## 2014-07-28 ENCOUNTER — Ambulatory Visit: Payer: Medicare Other | Admitting: Physical Therapy

## 2014-07-28 DIAGNOSIS — IMO0001 Reserved for inherently not codable concepts without codable children: Secondary | ICD-10-CM | POA: Diagnosis not present

## 2014-07-30 ENCOUNTER — Ambulatory Visit: Payer: Medicare Other | Admitting: Physical Therapy

## 2014-07-30 DIAGNOSIS — IMO0001 Reserved for inherently not codable concepts without codable children: Secondary | ICD-10-CM | POA: Diagnosis not present

## 2014-08-01 ENCOUNTER — Encounter: Payer: Self-pay | Admitting: Gastroenterology

## 2015-03-23 ENCOUNTER — Other Ambulatory Visit (HOSPITAL_COMMUNITY): Payer: Self-pay | Admitting: *Deleted

## 2015-03-24 ENCOUNTER — Encounter (HOSPITAL_COMMUNITY): Payer: Self-pay

## 2015-03-24 ENCOUNTER — Encounter (HOSPITAL_COMMUNITY)
Admission: RE | Admit: 2015-03-24 | Discharge: 2015-03-24 | Disposition: A | Discharge status: Home / Self Care | Payer: Medicare Other | Source: Ambulatory Visit | Attending: Internal Medicine | Admitting: Internal Medicine

## 2015-03-24 DIAGNOSIS — M810 Age-related osteoporosis without current pathological fracture: Secondary | ICD-10-CM | POA: Insufficient documentation

## 2015-03-24 MED ORDER — DENOSUMAB 60 MG/ML ~~LOC~~ SOLN
60.0000 mg | Freq: Once | SUBCUTANEOUS | Status: AC
  Administered 2015-03-24: 60 mg via SUBCUTANEOUS
  Filled 2015-03-24: qty 1

## 2015-09-08 ENCOUNTER — Other Ambulatory Visit (HOSPITAL_COMMUNITY): Payer: Self-pay | Admitting: *Deleted

## 2015-09-09 ENCOUNTER — Encounter (HOSPITAL_COMMUNITY)
Admission: RE | Admit: 2015-09-09 | Discharge: 2015-09-09 | Disposition: A | Discharge status: Home / Self Care | Payer: Medicare Other | Source: Ambulatory Visit | Attending: Internal Medicine | Admitting: Internal Medicine

## 2015-09-09 DIAGNOSIS — M810 Age-related osteoporosis without current pathological fracture: Secondary | ICD-10-CM | POA: Insufficient documentation

## 2015-09-09 MED ORDER — DENOSUMAB 60 MG/ML ~~LOC~~ SOLN
60.0000 mg | Freq: Once | SUBCUTANEOUS | Status: AC
  Administered 2015-09-09: 60 mg via SUBCUTANEOUS
  Filled 2015-09-09: qty 1

## 2017-01-06 DIAGNOSIS — M79661 Pain in right lower leg: Secondary | ICD-10-CM | POA: Diagnosis not present

## 2017-01-06 DIAGNOSIS — Z6824 Body mass index (BMI) 24.0-24.9, adult: Secondary | ICD-10-CM | POA: Diagnosis not present

## 2017-01-06 DIAGNOSIS — R600 Localized edema: Secondary | ICD-10-CM | POA: Diagnosis not present

## 2017-01-06 DIAGNOSIS — M7121 Synovial cyst of popliteal space [Baker], right knee: Secondary | ICD-10-CM | POA: Diagnosis not present

## 2017-04-08 DIAGNOSIS — L89150 Pressure ulcer of sacral region, unstageable: Secondary | ICD-10-CM | POA: Diagnosis not present

## 2017-04-08 DIAGNOSIS — R600 Localized edema: Secondary | ICD-10-CM | POA: Diagnosis not present

## 2017-04-08 DIAGNOSIS — M1710 Unilateral primary osteoarthritis, unspecified knee: Secondary | ICD-10-CM | POA: Diagnosis not present

## 2017-04-28 DIAGNOSIS — L89309 Pressure ulcer of unspecified buttock, unspecified stage: Secondary | ICD-10-CM | POA: Diagnosis not present

## 2017-04-28 DIAGNOSIS — Z6825 Body mass index (BMI) 25.0-25.9, adult: Secondary | ICD-10-CM | POA: Diagnosis not present

## 2017-05-02 ENCOUNTER — Observation Stay (HOSPITAL_COMMUNITY)
Admission: EM | Admit: 2017-05-02 | Discharge: 2017-05-03 | Disposition: A | Discharge status: Home / Self Care | Payer: PPO | Attending: Internal Medicine | Admitting: Internal Medicine

## 2017-05-02 ENCOUNTER — Encounter (HOSPITAL_COMMUNITY): Payer: Self-pay | Admitting: Emergency Medicine

## 2017-05-02 ENCOUNTER — Emergency Department (HOSPITAL_COMMUNITY): Payer: PPO

## 2017-05-02 DIAGNOSIS — I119 Hypertensive heart disease without heart failure: Secondary | ICD-10-CM | POA: Insufficient documentation

## 2017-05-02 DIAGNOSIS — E039 Hypothyroidism, unspecified: Secondary | ICD-10-CM | POA: Diagnosis not present

## 2017-05-02 DIAGNOSIS — R079 Chest pain, unspecified: Principal | ICD-10-CM | POA: Diagnosis present

## 2017-05-02 DIAGNOSIS — Z794 Long term (current) use of insulin: Secondary | ICD-10-CM | POA: Diagnosis not present

## 2017-05-02 DIAGNOSIS — D72829 Elevated white blood cell count, unspecified: Secondary | ICD-10-CM | POA: Diagnosis not present

## 2017-05-02 DIAGNOSIS — E119 Type 2 diabetes mellitus without complications: Secondary | ICD-10-CM | POA: Diagnosis not present

## 2017-05-02 DIAGNOSIS — K219 Gastro-esophageal reflux disease without esophagitis: Secondary | ICD-10-CM | POA: Diagnosis present

## 2017-05-02 DIAGNOSIS — E118 Type 2 diabetes mellitus with unspecified complications: Secondary | ICD-10-CM

## 2017-05-02 DIAGNOSIS — I083 Combined rheumatic disorders of mitral, aortic and tricuspid valves: Secondary | ICD-10-CM | POA: Insufficient documentation

## 2017-05-02 DIAGNOSIS — Z96612 Presence of left artificial shoulder joint: Secondary | ICD-10-CM | POA: Insufficient documentation

## 2017-05-02 DIAGNOSIS — G8929 Other chronic pain: Secondary | ICD-10-CM

## 2017-05-02 DIAGNOSIS — Z791 Long term (current) use of non-steroidal anti-inflammatories (NSAID): Secondary | ICD-10-CM | POA: Diagnosis not present

## 2017-05-02 DIAGNOSIS — K5900 Constipation, unspecified: Secondary | ICD-10-CM | POA: Insufficient documentation

## 2017-05-02 DIAGNOSIS — M1990 Unspecified osteoarthritis, unspecified site: Secondary | ICD-10-CM | POA: Diagnosis not present

## 2017-05-02 DIAGNOSIS — I10 Essential (primary) hypertension: Secondary | ICD-10-CM | POA: Diagnosis present

## 2017-05-02 DIAGNOSIS — R0789 Other chest pain: Secondary | ICD-10-CM | POA: Diagnosis not present

## 2017-05-02 DIAGNOSIS — K222 Esophageal obstruction: Secondary | ICD-10-CM | POA: Diagnosis not present

## 2017-05-02 DIAGNOSIS — Z85828 Personal history of other malignant neoplasm of skin: Secondary | ICD-10-CM | POA: Diagnosis not present

## 2017-05-02 DIAGNOSIS — J45909 Unspecified asthma, uncomplicated: Secondary | ICD-10-CM | POA: Diagnosis not present

## 2017-05-02 DIAGNOSIS — Z79899 Other long term (current) drug therapy: Secondary | ICD-10-CM | POA: Insufficient documentation

## 2017-05-02 DIAGNOSIS — Z7982 Long term (current) use of aspirin: Secondary | ICD-10-CM | POA: Diagnosis not present

## 2017-05-02 DIAGNOSIS — K210 Gastro-esophageal reflux disease with esophagitis: Secondary | ICD-10-CM

## 2017-05-02 DIAGNOSIS — Z9842 Cataract extraction status, left eye: Secondary | ICD-10-CM | POA: Diagnosis not present

## 2017-05-02 DIAGNOSIS — Z8249 Family history of ischemic heart disease and other diseases of the circulatory system: Secondary | ICD-10-CM | POA: Diagnosis not present

## 2017-05-02 DIAGNOSIS — D649 Anemia, unspecified: Secondary | ICD-10-CM | POA: Diagnosis not present

## 2017-05-02 DIAGNOSIS — N289 Disorder of kidney and ureter, unspecified: Secondary | ICD-10-CM | POA: Diagnosis not present

## 2017-05-02 DIAGNOSIS — K267 Chronic duodenal ulcer without hemorrhage or perforation: Secondary | ICD-10-CM | POA: Insufficient documentation

## 2017-05-02 LAB — BASIC METABOLIC PANEL
ANION GAP: 11 (ref 5–15)
BUN: 30 mg/dL — ABNORMAL HIGH (ref 6–20)
CALCIUM: 9.6 mg/dL (ref 8.9–10.3)
CHLORIDE: 99 mmol/L — AB (ref 101–111)
CO2: 26 mmol/L (ref 22–32)
CREATININE: 1.2 mg/dL — AB (ref 0.44–1.00)
GFR calc Af Amer: 44 mL/min — ABNORMAL LOW (ref >60)
GFR calc non Af Amer: 38 mL/min — ABNORMAL LOW (ref >60)
Glucose, Bld: 183 mg/dL — ABNORMAL HIGH (ref 65–99)
Potassium: 4.4 mmol/L (ref 3.5–5.1)
SODIUM: 136 mmol/L (ref 135–145)

## 2017-05-02 LAB — URINALYSIS, ROUTINE W REFLEX MICROSCOPIC
Bilirubin Urine: NEGATIVE
Glucose, UA: NEGATIVE mg/dL
Ketones, ur: NEGATIVE mg/dL
Nitrite: POSITIVE — AB
Protein, ur: NEGATIVE mg/dL
SPECIFIC GRAVITY, URINE: 1.014 (ref 1.005–1.030)
pH: 6 (ref 5.0–8.0)

## 2017-05-02 LAB — CBC
HCT: 37.5 % (ref 36.0–46.0)
HEMOGLOBIN: 12.1 g/dL (ref 12.0–15.0)
MCH: 30.6 pg (ref 26.0–34.0)
MCHC: 32.3 g/dL (ref 30.0–36.0)
MCV: 94.9 fL (ref 78.0–100.0)
PLATELETS: 253 10*3/uL (ref 150–400)
RBC: 3.95 MIL/uL (ref 3.87–5.11)
RDW: 13.6 % (ref 11.5–15.5)
WBC: 14.7 10*3/uL — AB (ref 4.0–10.5)

## 2017-05-02 LAB — I-STAT TROPONIN, ED: TROPONIN I, POC: 0.02 ng/mL (ref 0.00–0.08)

## 2017-05-02 LAB — TROPONIN I
TROPONIN I: 0.03 ng/mL — AB (ref <0.03)
Troponin I: 0.03 ng/mL (ref <0.03)

## 2017-05-02 LAB — GLUCOSE, CAPILLARY
GLUCOSE-CAPILLARY: 221 mg/dL — AB (ref 65–99)
GLUCOSE-CAPILLARY: 160 mg/dL — AB (ref 65–99)

## 2017-05-02 MED ORDER — ONDANSETRON HCL 4 MG/2ML IJ SOLN: 4.0000 mg | Freq: Four times a day (QID) | INTRAMUSCULAR | Status: DC | PRN

## 2017-05-02 MED ORDER — ONDANSETRON HCL 4 MG PO TABS: 4.0000 mg | ORAL_TABLET | Freq: Four times a day (QID) | ORAL | Status: DC | PRN

## 2017-05-02 MED ORDER — SODIUM CHLORIDE 0.9 % IN NEBU
3.0000 mL | INHALATION_SOLUTION | Freq: Every evening | RESPIRATORY_TRACT | Status: DC | PRN
  Filled 2017-05-02: qty 3

## 2017-05-02 MED ORDER — DICLOFENAC SODIUM 75 MG PO TBEC
75.0000 mg | DELAYED_RELEASE_TABLET | Freq: Two times a day (BID) | ORAL | Status: DC | PRN
  Administered 2017-05-02 – 2017-05-03 (×3): 75 mg via ORAL
  Filled 2017-05-02 (×4): qty 1

## 2017-05-02 MED ORDER — HYDROCODONE-ACETAMINOPHEN 5-325 MG PO TABS
1.0000 | ORAL_TABLET | ORAL | Status: DC | PRN
  Administered 2017-05-02 – 2017-05-03 (×2): 1 via ORAL
  Filled 2017-05-02 (×2): qty 1

## 2017-05-02 MED ORDER — PANTOPRAZOLE SODIUM 20 MG PO TBEC
20.0000 mg | DELAYED_RELEASE_TABLET | Freq: Every day | ORAL | Status: DC
  Administered 2017-05-02 – 2017-05-03 (×2): 20 mg via ORAL
  Filled 2017-05-02 (×2): qty 1

## 2017-05-02 MED ORDER — GI COCKTAIL ~~LOC~~: 30.0000 mL | Freq: Four times a day (QID) | ORAL | Status: DC | PRN

## 2017-05-02 MED ORDER — LOSARTAN POTASSIUM 50 MG PO TABS
100.0000 mg | ORAL_TABLET | Freq: Every day | ORAL | Status: DC
  Administered 2017-05-02 – 2017-05-03 (×2): 100 mg via ORAL
  Filled 2017-05-02 (×3): qty 2

## 2017-05-02 MED ORDER — SUCRALFATE 1 GM/10ML PO SUSP
1.0000 g | Freq: Three times a day (TID) | ORAL | Status: DC
  Administered 2017-05-02 – 2017-05-03 (×5): 1 g via ORAL
  Filled 2017-05-02 (×4): qty 10

## 2017-05-02 MED ORDER — INSULIN ASPART 100 UNIT/ML ~~LOC~~ SOLN: 0.0000 [IU] | Freq: Every day | SUBCUTANEOUS | Status: DC

## 2017-05-02 MED ORDER — INSULIN ASPART 100 UNIT/ML ~~LOC~~ SOLN
0.0000 [IU] | Freq: Three times a day (TID) | SUBCUTANEOUS | Status: DC
  Administered 2017-05-02: 3 [IU] via SUBCUTANEOUS
  Administered 2017-05-03: 1 [IU] via SUBCUTANEOUS
  Administered 2017-05-03: 7 [IU] via SUBCUTANEOUS

## 2017-05-02 MED ORDER — ACETAMINOPHEN 650 MG RE SUPP: 650.0000 mg | Freq: Four times a day (QID) | RECTAL | Status: DC | PRN

## 2017-05-02 MED ORDER — HEPARIN SODIUM (PORCINE) 5000 UNIT/ML IJ SOLN
5000.0000 [IU] | Freq: Three times a day (TID) | INTRAMUSCULAR | Status: DC
  Administered 2017-05-02 – 2017-05-03 (×3): 5000 [IU] via SUBCUTANEOUS
  Filled 2017-05-02 (×3): qty 1

## 2017-05-02 MED ORDER — LEVOTHYROXINE SODIUM 112 MCG PO TABS
112.0000 ug | ORAL_TABLET | Freq: Every day | ORAL | Status: DC
  Administered 2017-05-03: 112 ug via ORAL
  Filled 2017-05-02: qty 1

## 2017-05-02 MED ORDER — LOSARTAN POTASSIUM-HCTZ 100-25 MG PO TABS: 1.0000 | ORAL_TABLET | Freq: Every day | ORAL | Status: DC

## 2017-05-02 MED ORDER — ACETAMINOPHEN 325 MG PO TABS
650.0000 mg | ORAL_TABLET | Freq: Four times a day (QID) | ORAL | Status: DC | PRN
  Administered 2017-05-02: 650 mg via ORAL
  Filled 2017-05-02: qty 2

## 2017-05-02 MED ORDER — SODIUM CHLORIDE 0.9 % IV SOLN
INTRAVENOUS | Status: DC
  Administered 2017-05-02: 18:00:00 via INTRAVENOUS

## 2017-05-02 MED ORDER — ASPIRIN EC 325 MG PO TBEC
325.0000 mg | DELAYED_RELEASE_TABLET | Freq: Every day | ORAL | Status: DC
  Administered 2017-05-03: 325 mg via ORAL
  Filled 2017-05-02: qty 1

## 2017-05-02 MED ORDER — HYDRALAZINE HCL 20 MG/ML IJ SOLN: 5.0000 mg | INTRAMUSCULAR | Status: DC | PRN

## 2017-05-02 MED ORDER — HYDROCHLOROTHIAZIDE 25 MG PO TABS
25.0000 mg | ORAL_TABLET | Freq: Every day | ORAL | Status: DC
  Administered 2017-05-03: 25 mg via ORAL
  Filled 2017-05-02: qty 1

## 2017-05-02 MED ORDER — SALINE 0.9 % NA AERS: INHALATION_SPRAY | Freq: Every evening | NASAL | Status: DC | PRN

## 2017-05-02 NOTE — ED Notes (Signed)
Admitting MD at bedside.

## 2017-05-02 NOTE — ED Provider Notes (Signed)
[place] DEPT Provider Note   CSN: [id number] Arrival date & time: 05/02/17  1143     History   Chief Complaint Chief Complaint  Patient presents with  . Chest Pain    HPI Casey Watts is a 83 y.o. female who presents emergency department for chest pain. His abdomen is a healthy and independent 83 year old. She is here with her daughter-in-law who is healthcare power of attorney. The patient states that last night she had about 30 minutes of left-sided chest pain with associated pressure. She states that it was right in the left part of her chest under the breast and sternum and radiated to her back. She denies any shortness of breath, nausea, diaphoresis. She has never had any problems like this before and has never had any heart issues to her knowledge. She states that it improved. However, this morning when her aide came by. She told her about the incident and was also having the same kind of pain, however, more mild. She continues to have a bit of discomfort and pressure, but denies any current chest pain, nausea, diaphoresis. Patient denies cough, urinary symptoms or fevers. She currently has a fungal infection in her gluteal cleft for which she is being treated. She has a previously signed DO NOT RESUSCITATE.  HPI  Past Medical History:  Diagnosis Date  . Anemia   . Arthritis   . Asthma    as a child  . Cancer ([place])    skin cancer on left leg  . Diabetes mellitus    TYPE 2  . Esophageal stricture   . GERD (gastroesophageal reflux disease)   . Hypertension   . Hypothyroidism   . Thyroid disease     Patient Active Problem List   Diagnosis Date Noted  . GERD (gastroesophageal reflux disease) 02/13/2014  . Constipation 02/13/2014  . Hypothyroidism 02/13/2014  . Hypertension 02/13/2014  . Proximal humeral fracture 02/04/2014  . Stricture and stenosis of esophagus 02/28/2011  . Chronic duodenal ulcer without mention of hemorrhage, perforation, or obstruction  02/28/2011  . DM 01/19/2011    Past Surgical History:  Procedure Laterality Date  . APPENDECTOMY    . CATARACT EXTRACTION Left   . EYE SURGERY    . FOOT SURGERY Bilateral    Bunionectomy  . REVERSE SHOULDER ARTHROPLASTY Left 02/04/2014   DR MURPHY  . REVERSE SHOULDER ARTHROPLASTY Left 02/04/2014   Procedure: REVERSE SHOULDER ARTHROPLASTY;  Surgeon: Renette Butters, MD;  Location: [place];  Service: Orthopedics;  Laterality: Left;    OB History    No data available       Home Medications    Prior to Admission medications   Medication Sig Start Date End Date Taking? Authorizing Provider  acetaminophen (TYLENOL) 500 MG tablet Take 500 mg by mouth daily as needed for mild pain.    Yes [provider]  calcium-vitamin D (OSCAL WITH D) 500-200 MG-UNIT per tablet Take 1 tablet by mouth 2 (two) times daily.   Yes [provider]  diclofenac (VOLTAREN) 75 MG EC tablet Take 75 mg by mouth 2 (two) times daily.   Yes [provider]  glimepiride (AMARYL) 2 MG tablet Take 2 mg by mouth daily with breakfast.   Yes [provider]  irbesartan-hydrochlorothiazide (AVALIDE) 150-12.5 MG tablet Take 1 tablet by mouth daily.   Yes [provider]  lansoprazole (PREVACID) 15 MG capsule Take 15 mg by mouth daily.     Yes [provider]  levothyroxine (  SYNTHROID, LEVOTHROID) 112 MCG tablet Take 112 mcg by mouth daily. 1/2 tab on Sunday   Yes [provider]  SIMPLY SALINE NA Place 1 spray into both nostrils at bedtime as needed (nasal congestion).   Yes [provider]  aspirin 81 MG tablet Take 1 tablet (81 mg total) by mouth daily. 02/06/14   Renette Butters, MD  cholecalciferol (VITAMIN D) 1000 UNITS tablet Take 1,000 Units by mouth daily.    [provider]  DM-Doxylamine-Acetaminophen (DELSYM NIGHT TIME COUGH/COLD) 30-12.04-999 MG/30ML LIQD Take 10 mLs by mouth every 12 (twelve) hours as needed (cold/cough). Use as  needed    [provider]  docusate sodium (COLACE) 100 MG capsule Take 1 capsule (100 mg total) by mouth 2 (two) times daily as needed for mild constipation. Patient not taking: Reported on 05/02/2017 01/23/14   Kingsley Spittle, MD  FA-Cyanocobalamin-B6-D-Ca (D 1000 PLUS) TABS Take 1 tablet by mouth daily.     [provider]  HYDROcodone-acetaminophen (NORCO/VICODIN) 5-325 MG per tablet Take 1 tablet by mouth every 4 (four) hours as needed. Take one tablet by mouth every 4 hours as needed for pain 03/20/14   Lauree Chandler, NP  insulin lispro (HUMALOG) 100 UNIT/ML injection Inject 5 Units into the skin 2 (two) times daily. For CBG>250    [provider]  losartan-hydrochlorothiazide (HYZAAR) 100-25 MG per tablet Take 1 tablet by mouth daily.    [provider]  ondansetron (ZOFRAN) 4 MG tablet Take 1 tablet (4 mg total) by mouth every 8 (eight) hours as needed for nausea. Patient not taking: Reported on 05/02/2017 02/06/14   Renette Butters, MD    Family History Family History  Problem Relation Age of Onset  . Colon cancer Neg Hx   . Cancer Neg Hx     Social History Social History  Substance Use Topics  . Smoking status: Never Smoker  . Smokeless tobacco: Never Used  . Alcohol use Yes     Comment: occasional     Allergies   Patient has no known allergies.   Review of Systems Review of Systems  Ten systems reviewed and are negative for acute change, except as noted in the HPI.   Physical Exam Updated Vital Signs BP 140/76   Pulse (!) 52   Temp 98.3 F (36.8 C) (Oral)   Resp 19   Ht 5\' 1"  (1.549 m)   Wt 56.7 kg (125 lb)   SpO2 97%   BMI 23.62 kg/m   Physical Exam  Physical Exam  Nursing note and vitals reviewed. Constitutional: She is oriented to person, place, and time. She appears well-developed and well-nourished. No distress.  HENT:  Head: Normocephalic and atraumatic.  Eyes: Conjunctivae normal and EOM are normal. Pupils  are equal, round, and reactive to light. No scleral icterus.  Neck: Normal range of motion.  Cardiovascular: Normal rate, regular rhythm and normal heart sounds.  Exam reveals no gallop and no friction rub.   No murmur heard. Pulmonary/Chest: Effort normal and breath sounds normal. No respiratory distress.  Abdominal: Soft. Bowel sounds are normal. She exhibits no distension and no mass. There is no tenderness. There is no guarding.  Neurological: She is alert and oriented to person, place, and time.  Skin: Skin is warm and dry. She is not diaphoretic.    ED Treatments / Results  Labs (all labs ordered are listed, but only abnormal results are displayed) Labs Reviewed  BASIC METABOLIC PANEL - Abnormal; Notable  for the following:       Result Value   Chloride 99 (*)    Glucose, Bld 183 (*)    BUN 30 (*)    Creatinine, Ser 1.20 (*)    GFR calc non Af Amer 38 (*)    GFR calc Af Amer 44 (*)    All other components within normal limits  CBC - Abnormal; Notable for the following:    WBC 14.7 (*)    All other components within normal limits  I-STAT TROPOININ, ED    EKG  EKG Interpretation  Date/Time:  Tuesday May 02 2017 11:46:13 EDT Ventricular Rate:  70 PR Interval:    QRS Duration: 92 QT Interval:  410 QTC Calculation: 443 R Axis:   20 Text Interpretation:  Sinus rhythm RSR' in V1 or V2, probably normal variant T wave elevation in V3 Confirmed by Alvino Chapel  MD, NATHAN [phone]) on 05/02/2017 11:53:46 AM       Radiology Dg Chest 2 View  Result Date: 05/02/2017 CLINICAL DATA:  Left-sided chest pain. EXAM: CHEST  2 VIEW COMPARISON:  01/29/2014. FINDINGS: Mediastinum and hilar structures normal. Bilateral mild subsegmental atelectasis. Cardiomegaly normal pulmonary vascularity. Total left shoulder replacement . IMPRESSION: Bilateral mild subsegmental atelectasis.  No change from prior exam. Electronically Signed   By: Marcello Moores  Register   On: 05/02/2017 13:13     Procedures Procedures (including critical care time)  Medications Ordered in ED Medications - No data to display   Initial Impression / Assessment and Plan / ED Course  I have reviewed the triage vital signs and the nursing notes.  Pertinent labs & imaging results that were available during my care of the patient were reviewed by me and considered in my medical decision making (see chart for details).     83 year old female with a leukocytosis .she does have some changes on her EKG, which are concerning for potential ischemia, however, her troponin is negative. The patient is able to any kind of medical interventions, should she need them. I feel she will need admission for chest pain rule out.  Final Clinical Impressions(s) / ED Diagnoses   Final diagnoses:  None    New Prescriptions New Prescriptions   No medications on file     Margarita Mail, PA-C 05/02/17 1624    Davonna Belling, MD 05/07/17 [phone]

## 2017-05-02 NOTE — Progress Notes (Signed)
Pt is experiencing leg cramps gave PRN, Troponin is 0.03 MD aware

## 2017-05-02 NOTE — ED Triage Notes (Addendum)
Patient presents from home via [facility] complaining of chest pressure that began last night around midnight that radiated to her back and left shoulder. Patient began experiencing pressure again this morning and her home aide called ems. Patient has hx of hypertension, and DM. No cardiac history. 324mg  aspirin given in route to ED. Patient does have decubitus ulcer on sacral area. Patient not complaining of pain on arrival. Patient a/o x4

## 2017-05-02 NOTE — H&P (Signed)
History and Physical    Casey Watts WNU:[id number] DOB: 1924/05/09 DOA: 05/02/2017  PCP: Haywood Pao, MD Patient coming from: Home  Chief Complaint: CP   HPI: Casey Watts is a 83 y.o. female with medical history significant of anemia, arthritis, asthma, diabetes, esophageal stricture, GERD, hypertension, hypothyroidism. Patient presents with multiple episodes of chest pain. First episode occurred night prior to admission and lasted for approximately 30 minutes. Patient describes it as a pressure sensation in his substernal in location. Radiation to the back and upper chest. Patient states that she was in her bathroom and had just used the bathroom on the first episode started. Patient unsure if anything she did after onset of symptoms made her pain better or worse. Chest pain/pressure returned the morning of admission while she was sitting in her chair. This pain lasted for several minutes again. Home health aide and daughter advised patient to come to the emergency room and called EMS. EMS administered aspirin without immediate relief and pain. Patient states that she continues to have a nagging feeling in her chest that is hard to describe. Denies any shortness of breath with exertion, palpitations, nausea, vomiting. Denies recent fevers, abdominal pain, dysuria, frequency, back pain, neck stiffness, dizziness, focal neurological deficit, headache.  ED Course: Objective findings outlined below.  Review of Systems: As per HPI otherwise all other systems reviewed and are negative  Ambulatory Status: performs most of ADLs w/o assistance.   Past Medical History:  Diagnosis Date  . Anemia   . Arthritis   . Asthma    as a child  . Cancer ([place])    skin cancer on left leg  . Diabetes mellitus    TYPE 2  . Esophageal stricture   . GERD (gastroesophageal reflux disease)   . Hypertension   . Hypothyroidism   . Thyroid disease     Past Surgical History:  Procedure Laterality  Date  . APPENDECTOMY    . CATARACT EXTRACTION Left   . EYE SURGERY    . FOOT SURGERY Bilateral    Bunionectomy  . REVERSE SHOULDER ARTHROPLASTY Left 02/04/2014   DR MURPHY  . REVERSE SHOULDER ARTHROPLASTY Left 02/04/2014   Procedure: REVERSE SHOULDER ARTHROPLASTY;  Surgeon: Renette Butters, MD;  Location: [facility];  Service: Orthopedics;  Laterality: Left;    Social History   Social History  . Marital status: Widowed    Spouse name: N/A  . Number of children: N/A  . Years of education: N/A   Occupational History  . Retired    Social History Main Topics  . Smoking status: Never Smoker  . Smokeless tobacco: Never Used  . Alcohol use Yes     Comment: occasional  . Drug use: No  . Sexual activity: Not on file   Other Topics Concern  . Not on file   Social History Narrative  . No narrative on file    No Known Allergies  Family History  Problem Relation Age of Onset  . Heart disease Brother   . Colon cancer Neg Hx   . Cancer Neg Hx     Prior to Admission medications   Medication Sig Start Date End Date Taking? Authorizing Provider  acetaminophen (TYLENOL) 500 MG tablet Take 500 mg by mouth daily as needed for mild pain.    Yes [provider]  calcium-vitamin D (OSCAL WITH D) 500-200 MG-UNIT per tablet Take 1 tablet by mouth 2 (two) times daily.   Yes [provider]  diclofenac (VOLTAREN) 75 MG EC tablet Take 75 mg by mouth 2 (two) times daily.   Yes [provider]  glimepiride (AMARYL) 2 MG tablet Take 2 mg by mouth daily with breakfast.   Yes [provider]  irbesartan-hydrochlorothiazide (AVALIDE) 150-12.5 MG tablet Take 1 tablet by mouth daily.   Yes [provider]  lansoprazole (PREVACID) 15 MG capsule Take 15 mg by mouth daily.     Yes [provider]  levothyroxine (SYNTHROID, LEVOTHROID) 112 MCG tablet Take 112 mcg by mouth daily. 1/2 tab on Sunday   Yes [provider]  SIMPLY SALINE NA Place 1  spray into both nostrils at bedtime as needed (nasal congestion).   Yes [provider]  aspirin 81 MG tablet Take 1 tablet (81 mg total) by mouth daily. 02/06/14   Renette Butters, MD  cholecalciferol (VITAMIN D) 1000 UNITS tablet Take 1,000 Units by mouth daily.    [provider]  DM-Doxylamine-Acetaminophen (DELSYM NIGHT TIME COUGH/COLD) 30-12.04-999 MG/30ML LIQD Take 10 mLs by mouth every 12 (twelve) hours as needed (cold/cough). Use as needed    [provider]  docusate sodium (COLACE) 100 MG capsule Take 1 capsule (100 mg total) by mouth 2 (two) times daily as needed for mild constipation. Patient not taking: Reported on 05/02/2017 01/23/14   Kingsley Spittle, MD  FA-Cyanocobalamin-B6-D-Ca (D 1000 PLUS) TABS Take 1 tablet by mouth daily.     [provider]  HYDROcodone-acetaminophen (NORCO/VICODIN) 5-325 MG per tablet Take 1 tablet by mouth every 4 (four) hours as needed. Take one tablet by mouth every 4 hours as needed for pain 03/20/14   Lauree Chandler, NP  insulin lispro (HUMALOG) 100 UNIT/ML injection Inject 5 Units into the skin 2 (two) times daily. For CBG>250    [provider]  losartan-hydrochlorothiazide (HYZAAR) 100-25 MG per tablet Take 1 tablet by mouth daily.    [provider]  ondansetron (ZOFRAN) 4 MG tablet Take 1 tablet (4 mg total) by mouth every 8 (eight) hours as needed for nausea. Patient not taking: Reported on 05/02/2017 02/06/14   Renette Butters, MD    Physical Exam: Vitals:   05/02/17 1330 05/02/17 1349 05/02/17 1417 05/02/17 1445  BP: (!) 167/68 (!) 163/68 (!) 148/84 133/72  Pulse: (!) 58 62 76 64  Resp: (!) 23 18  (!) 23  Temp:  98 F (36.7 C)    TempSrc:  Oral    SpO2: 94% 96% 98% 93%  Weight:      Height:         General:  Appears calm and comfortable Eyes:  PERRL, EOMI, normal lids, iris ENT:  grossly normal hearing, lips & tongue, mmm Neck:  no LAD, masses or thyromegaly Cardiovascular:   RRR, no m/r/g. No LE edema.  Respiratory:  CTA bilaterally, no w/r/r. Normal respiratory effort. Abdomen:  soft, ntnd, NABS Skin:  no rash or induration seen on limited exam Musculoskeletal:  grossly normal tone BUE/BLE, good ROM, no bony abnormality, chest pain is not reproducible on deep palpation of chest wall. Psychiatric:  grossly normal mood and affect, speech fluent and appropriate, AOx3 Neurologic:  CN 2-12 grossly intact, moves all extremities in coordinated fashion, sensation intact  Labs on Admission: I have personally reviewed following labs and imaging studies  CBC:  Recent Labs Lab 05/02/17 1153  WBC 14.7*  HGB 12.1  HCT 37.5  MCV 94.9  PLT 956   Basic Metabolic Panel:  Recent Labs Lab 05/02/17  1153  NA 136  K 4.4  CL 99*  CO2 26  GLUCOSE 183*  BUN 30*  CREATININE 1.20*  CALCIUM 9.6   GFR: Estimated Creatinine Clearance: 23 mL/min (A) (by C-G formula based on SCr of 1.2 mg/dL (H)). Liver Function Tests: No results for input(s): AST, ALT, ALKPHOS, BILITOT, PROT, ALBUMIN in the last 168 hours. No results for input(s): LIPASE, AMYLASE in the last 168 hours. No results for input(s): AMMONIA in the last 168 hours. Coagulation Profile: No results for input(s): INR, PROTIME in the last 168 hours. Cardiac Enzymes: No results for input(s): CKTOTAL, CKMB, CKMBINDEX, TROPONINI in the last 168 hours. BNP (last 3 results) No results for input(s): PROBNP in the last 8760 hours. HbA1C: No results for input(s): HGBA1C in the last 72 hours. CBG: No results for input(s): GLUCAP in the last 168 hours. Lipid Profile: No results for input(s): CHOL, HDL, LDLCALC, TRIG, CHOLHDL, LDLDIRECT in the last 72 hours. Thyroid Function Tests: No results for input(s): TSH, T4TOTAL, FREET4, T3FREE, THYROIDAB in the last 72 hours. Anemia Panel: No results for input(s): VITAMINB12, FOLATE, FERRITIN, TIBC, IRON, RETICCTPCT in the last 72 hours. Urine analysis: No results found for:  COLORURINE, APPEARANCEUR, LABSPEC, PHURINE, GLUCOSEU, HGBUR, BILIRUBINUR, KETONESUR, PROTEINUR, UROBILINOGEN, NITRITE, LEUKOCYTESUR  Creatinine Clearance: Estimated Creatinine Clearance: 23 mL/min (A) (by C-G formula based on SCr of 1.2 mg/dL (H)).  Sepsis Labs: @LABRCNTIP (procalcitonin:4,lacticidven:4) )No results found for this or any previous visit (from the past 240 hour(s)).   Radiological Exams on Admission: Dg Chest 2 View  Result Date: 05/02/2017 CLINICAL DATA:  Left-sided chest pain. EXAM: CHEST  2 VIEW COMPARISON:  01/29/2014. FINDINGS: Mediastinum and hilar structures normal. Bilateral mild subsegmental atelectasis. Cardiomegaly normal pulmonary vascularity. Total left shoulder replacement . IMPRESSION: Bilateral mild subsegmental atelectasis.  No change from prior exam. Electronically Signed   By: [place]   On: 05/02/2017 13:13    EKG: Independently reviewed. Sinus. Nonspecific changes in antlat leads  Assessment/Plan Active Problems:   GERD (gastroesophageal reflux disease)   Hypothyroidism   Hypertension   Chest pain   Diabetes mellitus with complication (HCC)   Renal insufficiency   Leukocytosis   Chronic pain    CP: cardiac vs GI vs pleuritic vs other. No h/o cardiac disease but pt most assuredly has CAD given age and risk factors. EKG unremarkable and Trop negative. Pt on daily Voltaren which may be leading to more GI esophagitis. - Tele, EKG in am, cycle trop - ASA, GI cocktail - Echo - consider cards consult if sx return or abnormalities show up in workup - pt would be amenable to cardiac cath - Carafate.   DM: - SSI  HTN: - continue Hyzaar - Hydralazine prn  Renal insufficiency: Cr 1.2. Unclear of baseline due to infrequent testing. Family endorsing very poor oral intake at baseline. Pt on daily NSAIDS - IVF - BMET in am  Leukocytosis: 14.5 on admission. Asymptomatic. CXR w/o acute abnormality. No specific complaints consistent w/  infectious process. UA pending. May simply be stress in duced.  - CBC in am - f/u UA.   Chronic pain: - continue norco, voltaren (change to PRN due to GI concern)  GERD: - continue PPI  Hypothyroid: - continue synthroid  DVT prophylaxis: Hep  Code Status: DNR  Family Communication: daughter  Disposition Plan: pending CP r/o  Consults called: none  Admission status: observation    Roey Coopman J MD [facility]  If 7PM-7AM, please contact night-coverage [email or web address] Password [email or web address]  05/02/2017, 3:05 PM

## 2017-05-02 NOTE — ED Notes (Signed)
Patient assisted with 2 staff members to bathroom. Patient family member states patient is ambulatory at home.

## 2017-05-02 NOTE — ED Notes (Signed)
Pt is eating lunch  °

## 2017-05-03 ENCOUNTER — Observation Stay (HOSPITAL_BASED_OUTPATIENT_CLINIC_OR_DEPARTMENT_OTHER): Payer: PPO

## 2017-05-03 DIAGNOSIS — K267 Chronic duodenal ulcer without hemorrhage or perforation: Secondary | ICD-10-CM | POA: Diagnosis not present

## 2017-05-03 DIAGNOSIS — K219 Gastro-esophageal reflux disease without esophagitis: Secondary | ICD-10-CM | POA: Diagnosis not present

## 2017-05-03 DIAGNOSIS — E119 Type 2 diabetes mellitus without complications: Secondary | ICD-10-CM | POA: Diagnosis not present

## 2017-05-03 DIAGNOSIS — R079 Chest pain, unspecified: Secondary | ICD-10-CM

## 2017-05-03 DIAGNOSIS — I340 Nonrheumatic mitral (valve) insufficiency: Secondary | ICD-10-CM

## 2017-05-03 DIAGNOSIS — J45909 Unspecified asthma, uncomplicated: Secondary | ICD-10-CM | POA: Diagnosis not present

## 2017-05-03 DIAGNOSIS — K5900 Constipation, unspecified: Secondary | ICD-10-CM | POA: Diagnosis not present

## 2017-05-03 DIAGNOSIS — D649 Anemia, unspecified: Secondary | ICD-10-CM | POA: Diagnosis not present

## 2017-05-03 DIAGNOSIS — I119 Hypertensive heart disease without heart failure: Secondary | ICD-10-CM | POA: Diagnosis not present

## 2017-05-03 DIAGNOSIS — E039 Hypothyroidism, unspecified: Secondary | ICD-10-CM | POA: Diagnosis not present

## 2017-05-03 DIAGNOSIS — G8929 Other chronic pain: Secondary | ICD-10-CM | POA: Diagnosis not present

## 2017-05-03 DIAGNOSIS — K222 Esophageal obstruction: Secondary | ICD-10-CM | POA: Diagnosis not present

## 2017-05-03 DIAGNOSIS — M1990 Unspecified osteoarthritis, unspecified site: Secondary | ICD-10-CM | POA: Diagnosis not present

## 2017-05-03 LAB — ECHOCARDIOGRAM COMPLETE
AOASC: 32 cm
AV Area mean vel: 1.34 cm2
AV Mean grad: 13 mmHg
AV area mean vel ind: 0.82 cm2/m2
AV vel: 1.31
AVA: 1.31 cm2
AVAREAVTI: 1.63 cm2
AVAREAVTIIND: 0.81 cm2/m2
AVCELMEANRAT: 0.43
AVLVOTPG: 6 mmHg
AVPG: 23 mmHg
AVPKVEL: 241 cm/s
Ao pk vel: 0.52 m/s
CHL CUP AV PEAK INDEX: 1
CHL CUP AV VALUE AREA INDEX: 0.81
DOP CAL AO MEAN VELOCITY: 170 cm/s
E decel time: 271 msec
E/e' ratio: 15.07
FS: 26 % — AB (ref 28–44)
Height: 61 in
IVS/LV PW RATIO, ED: 1.25
LA diam end sys: 46 mm
LA vol A4C: 37.9 ml
LA vol index: 27.6 mL/m2
LA vol: 44.9 mL
LADIAMINDEX: 2.83 cm/m2
LASIZE: 46 mm
LV E/e' medial: 15.07
LV PW d: 9.38 mm — AB (ref 0.6–1.1)
LV TDI E'MEDIAL: 6.2
LV e' LATERAL: 8.16 cm/s
LVEEAVG: 15.07
LVOT VTI: 25.8 cm
LVOT area: 3.14 cm2
LVOT peak vel: 125 cm/s
LVOTD: 20 mm
LVOTSV: 81 mL
LVOTVTI: 0.42 cm
Lateral S' vel: 14.9 cm/s
MV Dec: 271
MV Peak grad: 6 mmHg
MV pk E vel: 123 m/s
MVAP: 2.78 cm2
MVPKAVEL: 138 m/s
MVSPHT: 79 ms
RV TAPSE: 20 mm
Reg peak vel: 320 cm/s
TDI e' lateral: 8.16
TR max vel: 320 cm/s
VTI: 62 cm
Weight: 2131.2 oz

## 2017-05-03 LAB — CBC
HCT: 34.4 % — ABNORMAL LOW (ref 36.0–46.0)
Hemoglobin: 10.9 g/dL — ABNORMAL LOW (ref 12.0–15.0)
MCH: 30.1 pg (ref 26.0–34.0)
MCHC: 31.7 g/dL (ref 30.0–36.0)
MCV: 95 fL (ref 78.0–100.0)
PLATELETS: 242 10*3/uL (ref 150–400)
RBC: 3.62 MIL/uL — ABNORMAL LOW (ref 3.87–5.11)
RDW: 13.7 % (ref 11.5–15.5)
WBC: 11.1 10*3/uL — ABNORMAL HIGH (ref 4.0–10.5)

## 2017-05-03 LAB — BASIC METABOLIC PANEL
Anion gap: 10 (ref 5–15)
BUN: 31 mg/dL — AB (ref 6–20)
CO2: 26 mmol/L (ref 22–32)
Calcium: 9 mg/dL (ref 8.9–10.3)
Chloride: 100 mmol/L — ABNORMAL LOW (ref 101–111)
Creatinine, Ser: 1.28 mg/dL — ABNORMAL HIGH (ref 0.44–1.00)
GFR calc Af Amer: 41 mL/min — ABNORMAL LOW (ref >60)
GFR, EST NON AFRICAN AMERICAN: 35 mL/min — AB (ref >60)
GLUCOSE: 150 mg/dL — AB (ref 65–99)
POTASSIUM: 4 mmol/L (ref 3.5–5.1)
Sodium: 136 mmol/L (ref 135–145)

## 2017-05-03 LAB — GLUCOSE, CAPILLARY
Glucose-Capillary: 138 mg/dL — ABNORMAL HIGH (ref 65–99)
Glucose-Capillary: 311 mg/dL — ABNORMAL HIGH (ref 65–99)

## 2017-05-03 MED ORDER — SUCRALFATE 1 GM/10ML PO SUSP: 1.0000 g | Freq: Three times a day (TID) | ORAL | 0 refills | Status: DC

## 2017-05-03 MED ORDER — LANSOPRAZOLE 15 MG PO CPDR: 15.0000 mg | DELAYED_RELEASE_CAPSULE | Freq: Two times a day (BID) | ORAL | 0 refills | Status: DC

## 2017-05-03 NOTE — Progress Notes (Signed)
  Echocardiogram 2D Echocardiogram has been performed.  Casey Watts 05/03/2017, 2:44 PM

## 2017-05-03 NOTE — Discharge Summary (Signed)
[facility]  Physician Discharge Summary   Patient ID: Casey Watts MRN: [id number] DOB/AGE: Jul 21, 1924 83 y.o.  Admit date: 05/02/2017 Discharge date: 05/03/2017  PCP: Haywood Pao, MD  DISCHARGE DIAGNOSES:  Active Problems:   GERD (gastroesophageal reflux disease)   Hypothyroidism   Hypertension   Chest pain   Diabetes mellitus with complication (HCC)   Renal insufficiency   Leukocytosis   Chronic pain   RECOMMENDATIONS FOR OUTPATIENT FOLLOW UP: 1. Patient recommended to follow-up with her primary care provider within one week 2. Dose of PPI increased to twice daily for now  DISCHARGE CONDITION: fair  Diet recommendation: As before  [facility] Weights   05/02/17 1151 05/02/17 1535 05/03/17 0616  Weight: 56.7 kg (125 lb) 60.2 kg (132 lb 11.5 oz) 60.4 kg (133 lb 3.2 oz)    INITIAL HISTORY: 83 year old Caucasian female with a past medical history of anemia, arthritis, asthma, diabetes, esophageal stricture, GERD, hypertension, hypothyroidism, presented with chest pain which has been on and off since Monday night. Patient decided to seek attention until stay. She was hospitalized for further evaluation.  Consultations:  None  Procedures: Transthoracic echocardiogram Study Conclusions  - Left ventricle: The cavity size was normal. There was mild focal   basal hypertrophy of the septum. Systolic function was vigorous.   The estimated ejection fraction was in the range of 65% to 70%.   Wall motion was normal; there were no regional wall motion   abnormalities. Doppler parameters are consistent with abnormal   left ventricular relaxation (grade 1 diastolic dysfunction).   Doppler parameters are consistent with high ventricular filling   pressure. - Aortic valve: There was mild stenosis. - Mitral valve: Calcified annulus. There was mild regurgitation. - Left atrium: The atrium was mildly dilated. - Pulmonary arteries: Systolic pressure was moderately  increased.  Impressions:  - Vigorous LV systolic function; mild diastolic dysfunction;   elevated LV filling pressure; calcified aortic valve with mild AS   (mean gradient 13 mmHg); mild MR; mild LAE; mild TR; moderately   elevated pulmonary pressure.  HOSPITAL COURSE:   Chest pain EKG did not show any acute changes. No significant elevation in troponin. Patient was given GI cocktail. Chest pain has resolved. EKG repeated with no new changes. Pain could be GI in origin. Patient does take Voltaren daily, which could be contributing. Echocardiogram shows normal systolic function. No wall motion abnormalities. Mild valvular disease noted. Communicated to the patient and her daughter. She has been chest pain-free. Increase dose of PPI to twice a day. Okay for discharge at this time. Outpatient follow-up with PCP  History of diabetes mellitus type 2 Continue home medication  History of essential hypertension. Continue home medications  Renal insufficiency Unclear if she has baseline renal dysfunction. Creatinine is stable for the most part. Would recommend she discontinue NSAID use. Will need continued follow-up as outpatient.  Leukocytosis Initial WBC was 14.7. Improved subsequently. She has been afebrile.   Abnormal UA Patient denies any symptoms of UTI. No dysuria or frequency. No need for treatment at this time.  Chronic pain Continue home medications  GERD Increase PPI to twice a day as mentioned above.  Hypothyroidism Continue medications  Overall, stable. Okay for discharge home today.  PERTINENT LABS:  The results of significant diagnostics from this hospitalization (including imaging, microbiology, ancillary and laboratory) are listed below for reference.      Labs: Basic Metabolic Panel:  Recent Labs Lab 05/02/17 1153 05/03/17 0227  NA 136 136  K 4.4 4.0  CL 99* 100*  CO2 26 26  GLUCOSE 183* 150*  BUN 30* 31*  CREATININE 1.20* 1.28*  CALCIUM  9.6 9.0   CBC:  Recent Labs Lab 05/02/17 1153 05/03/17 0227  WBC 14.7* 11.1*  HGB 12.1 10.9*  HCT 37.5 34.4*  MCV 94.9 95.0  PLT 253 242   Cardiac Enzymes:  Recent Labs Lab 05/02/17 1414 05/02/17 1944  TROPONINI 0.03* 0.03*   CBG:  Recent Labs Lab 05/02/17 1626 05/02/17 2207 05/03/17 0755 05/03/17 1150  GLUCAP 221* 160* 138* 311*     IMAGING STUDIES Dg Chest 2 View  Result Date: 05/02/2017 CLINICAL DATA:  Left-sided chest pain. EXAM: CHEST  2 VIEW COMPARISON:  01/29/2014. FINDINGS: Mediastinum and hilar structures normal. Bilateral mild subsegmental atelectasis. Cardiomegaly normal pulmonary vascularity. Total left shoulder replacement . IMPRESSION: Bilateral mild subsegmental atelectasis.  No change from prior exam. Electronically Signed   By: Marcello Moores  Register   On: 05/02/2017 13:13    DISCHARGE EXAMINATION: Vitals:   05/02/17 2026 05/03/17 0125 05/03/17 0616 05/03/17 0817  BP: (!) 135/55 (!) 150/70 (!) 140/50 (!) 152/56  Pulse: (!) 58 67 70 (!) 54  Resp: 17 17 17 18   Temp: 97.3 F (36.3 C) 97.6 F (36.4 C) 97.8 F (36.6 C) 97.7 F (36.5 C)  TempSrc: Oral Oral Oral Oral  SpO2: 96% 95% 96% 99%  Weight:   60.4 kg (133 lb 3.2 oz)   Height:       General appearance: alert, cooperative, appears stated age and no distress Resp: clear to auscultation bilaterally Cardio: regular rate and rhythm, S1, S2 normal, no murmur, click, rub or gallop GI: soft, non-tender; bowel sounds normal; no masses,  no organomegaly Extremities: extremities normal, atraumatic, no cyanosis or edema  DISPOSITION: Home with daughter  Discharge Instructions    Call MD for:  difficulty breathing, headache or visual disturbances    Complete by:  As directed    Call MD for:  extreme fatigue    Complete by:  As directed    Call MD for:  persistant dizziness or light-headedness    Complete by:  As directed    Call MD for:  persistant nausea and vomiting    Complete by:  As directed     Call MD for:  severe uncontrolled pain    Complete by:  As directed    Call MD for:  temperature >100.4    Complete by:  As directed    Diet Carb Modified    Complete by:  As directed    Discharge instructions    Complete by:  As directed    Please be sure to follow-up with your primary care provider next week. Please take your medications as prescribed. Stop taking the diclofenac for now. Seek attention if symptoms recur.  You were cared for by a hospitalist during your hospital stay. If you have any questions about your discharge medications or the care you received while you were in the hospital after you are discharged, you can call the unit and asked to speak with the hospitalist on call if the hospitalist that took care of you is not available. Once you are discharged, your primary care physician will handle any further medical issues. Please note that NO REFILLS for any discharge medications will be authorized once you are discharged, as it is imperative that you return to your primary care physician (or establish a relationship with a primary care physician if you do not have  one) for your aftercare needs so that they can reassess your need for medications and monitor your lab values. If you do not have a primary care physician, you can call [phone] for a physician referral.   Increase activity slowly    Complete by:  As directed       ALLERGIES: No Known Allergies   Current Discharge Medication List    START taking these medications   Details  sucralfate (CARAFATE) 1 GM/10ML suspension Take 10 mLs (1 g total) by mouth 4 (four) times daily -  with meals and at bedtime. Qty: 420 mL, Refills: 0      CONTINUE these medications which have CHANGED   Details  lansoprazole (PREVACID) 15 MG capsule Take 1 capsule (15 mg total) by mouth 2 (two) times daily before a meal. Qty: 60 capsule, Refills: 0      CONTINUE these medications which have NOT CHANGED   Details  acetaminophen  (TYLENOL) 500 MG tablet Take 500 mg by mouth daily as needed for mild pain.     calcium-vitamin D (OSCAL WITH D) 500-200 MG-UNIT per tablet Take 1 tablet by mouth 2 (two) times daily.    glimepiride (AMARYL) 2 MG tablet Take 2 mg by mouth daily with breakfast.    irbesartan-hydrochlorothiazide (AVALIDE) 150-12.5 MG tablet Take 1 tablet by mouth daily.    levothyroxine (SYNTHROID, LEVOTHROID) 112 MCG tablet Take 112 mcg by mouth daily. 1/2 tab on Sunday    SIMPLY SALINE NA Place 1 spray into both nostrils at bedtime as needed (nasal congestion).    aspirin 81 MG tablet Take 1 tablet (81 mg total) by mouth daily. Qty: 30 tablet, Refills: 0    cholecalciferol (VITAMIN D) 1000 UNITS tablet Take 1,000 Units by mouth daily.    DM-Doxylamine-Acetaminophen (DELSYM NIGHT TIME COUGH/COLD) 30-12.04-999 MG/30ML LIQD Take 10 mLs by mouth every 12 (twelve) hours as needed (cold/cough). Use as needed    docusate sodium (COLACE) 100 MG capsule Take 1 capsule (100 mg total) by mouth 2 (two) times daily as needed for mild constipation. Qty: 60 capsule, Refills: 0    FA-Cyanocobalamin-B6-D-Ca (D 1000 PLUS) TABS Take 1 tablet by mouth daily.     HYDROcodone-acetaminophen (NORCO/VICODIN) 5-325 MG per tablet Take 1 tablet by mouth every 4 (four) hours as needed. Take one tablet by mouth every 4 hours as needed for pain    insulin lispro (HUMALOG) 100 UNIT/ML injection Inject 5 Units into the skin 2 (two) times daily. For CBG>250      STOP taking these medications     diclofenac (VOLTAREN) 75 MG EC tablet      losartan-hydrochlorothiazide (HYZAAR) 100-25 MG per tablet      ondansetron (ZOFRAN) 4 MG tablet          Follow-up Information    Tisovec, Fransico Him, MD. Schedule an appointment as soon as possible for a visit in 1 week(s).   Specialty:  Internal Medicine Contact information: [place] [address] [phone]           TOTAL DISCHARGE TIME: 44  mins  [facility] Pager [phone]  05/03/2017, 4:54 PM

## 2017-05-03 NOTE — Care Management Note (Signed)
Case Management Note  Patient Details  Name: Casey Watts MRN: [id number] Date of Birth: 05/10/1924  Subjective/Objective:  Chest pain                Action/Plan: CM talked to patient with daughter present about DCP; she has a personal care services that provide additional support at home and daughter stated that her PCP Dr Osborne Casco had arranged [facility] services to be started at discharge. No DME is needed at this time.  Expected Discharge Date:  05/03/17               Expected Discharge Plan:  [place]   Discharge planning Services  CM Consult  Status of Service:  In process, will continue to follow  Sherrilyn Rist [phone] 05/03/2017, 3:50 PM

## 2017-05-03 NOTE — Discharge Instructions (Signed)

## 2017-05-03 NOTE — Consult Note (Signed)
   [facility] CM Inpatient Consult   05/03/2017  Casey Watts 02-Mar-1924 [id number]  Patient was assessed for care management needs for the [place].  Patient is a 83 year old female with chest pain with a HX of DM, GERD and at the hospital for observation.  Met with the patient and her daughter Tamera Punt about [place] [facility] as a benefit of her insurance. Daughter states that her mom has private pay sitters that manages her food, medication, ADLs.  Daughter states the patient's primary care provider is Dr. Domenick Gong.  She state patient has a request for home health wound nurse just prior to hospitalization but has not heard anything. A brochure with [facility] information given and for contact.  Will let inpatient RNCM, Hassan Rowan know of the daughter's concern for home health nurse for her mom's wound.  States she would like to see how the patient progressed with [facility] and will call for additional nursing support of [facility] if needed.  For question, please contact:  Natividad Brood, RN BSN [facility] Liaison  [phone] business mobile phone Toll free office [phone]

## 2017-05-13 DIAGNOSIS — L89152 Pressure ulcer of sacral region, stage 2: Secondary | ICD-10-CM | POA: Diagnosis not present

## 2017-05-13 DIAGNOSIS — N183 Chronic kidney disease, stage 3 (moderate): Secondary | ICD-10-CM | POA: Diagnosis not present

## 2017-05-13 DIAGNOSIS — E1122 Type 2 diabetes mellitus with diabetic chronic kidney disease: Secondary | ICD-10-CM | POA: Diagnosis not present

## 2017-05-13 DIAGNOSIS — Z7984 Long term (current) use of oral hypoglycemic drugs: Secondary | ICD-10-CM | POA: Diagnosis not present

## 2017-05-13 DIAGNOSIS — I129 Hypertensive chronic kidney disease with stage 1 through stage 4 chronic kidney disease, or unspecified chronic kidney disease: Secondary | ICD-10-CM | POA: Diagnosis not present

## 2017-05-25 DIAGNOSIS — E1122 Type 2 diabetes mellitus with diabetic chronic kidney disease: Secondary | ICD-10-CM | POA: Diagnosis not present

## 2017-05-25 DIAGNOSIS — Z7984 Long term (current) use of oral hypoglycemic drugs: Secondary | ICD-10-CM | POA: Diagnosis not present

## 2017-05-25 DIAGNOSIS — L89152 Pressure ulcer of sacral region, stage 2: Secondary | ICD-10-CM | POA: Diagnosis not present

## 2017-05-25 DIAGNOSIS — N183 Chronic kidney disease, stage 3 (moderate): Secondary | ICD-10-CM | POA: Diagnosis not present

## 2017-05-25 DIAGNOSIS — I129 Hypertensive chronic kidney disease with stage 1 through stage 4 chronic kidney disease, or unspecified chronic kidney disease: Secondary | ICD-10-CM | POA: Diagnosis not present

## 2017-05-29 ENCOUNTER — Other Ambulatory Visit: Payer: Self-pay

## 2017-07-14 DIAGNOSIS — R8299 Other abnormal findings in urine: Secondary | ICD-10-CM | POA: Diagnosis not present

## 2017-07-14 DIAGNOSIS — E1129 Type 2 diabetes mellitus with other diabetic kidney complication: Secondary | ICD-10-CM | POA: Diagnosis not present

## 2017-07-14 DIAGNOSIS — E038 Other specified hypothyroidism: Secondary | ICD-10-CM | POA: Diagnosis not present

## 2017-07-14 DIAGNOSIS — E559 Vitamin D deficiency, unspecified: Secondary | ICD-10-CM | POA: Diagnosis not present

## 2017-07-14 DIAGNOSIS — E7800 Pure hypercholesterolemia, unspecified: Secondary | ICD-10-CM | POA: Diagnosis not present

## 2017-07-14 DIAGNOSIS — N390 Urinary tract infection, site not specified: Secondary | ICD-10-CM | POA: Diagnosis not present

## 2017-07-14 DIAGNOSIS — N183 Chronic kidney disease, stage 3 (moderate): Secondary | ICD-10-CM | POA: Diagnosis not present

## 2017-07-21 DIAGNOSIS — Z1389 Encounter for screening for other disorder: Secondary | ICD-10-CM | POA: Diagnosis not present

## 2017-07-21 DIAGNOSIS — Z23 Encounter for immunization: Secondary | ICD-10-CM | POA: Diagnosis not present

## 2017-07-21 DIAGNOSIS — I129 Hypertensive chronic kidney disease with stage 1 through stage 4 chronic kidney disease, or unspecified chronic kidney disease: Secondary | ICD-10-CM | POA: Diagnosis not present

## 2017-07-21 DIAGNOSIS — N393 Stress incontinence (female) (male): Secondary | ICD-10-CM | POA: Diagnosis not present

## 2017-07-21 DIAGNOSIS — D692 Other nonthrombocytopenic purpura: Secondary | ICD-10-CM | POA: Diagnosis not present

## 2017-07-21 DIAGNOSIS — E1129 Type 2 diabetes mellitus with other diabetic kidney complication: Secondary | ICD-10-CM | POA: Diagnosis not present

## 2017-07-21 DIAGNOSIS — N183 Chronic kidney disease, stage 3 (moderate): Secondary | ICD-10-CM | POA: Diagnosis not present

## 2017-07-21 DIAGNOSIS — E559 Vitamin D deficiency, unspecified: Secondary | ICD-10-CM | POA: Diagnosis not present

## 2017-07-21 DIAGNOSIS — L89309 Pressure ulcer of unspecified buttock, unspecified stage: Secondary | ICD-10-CM | POA: Diagnosis not present

## 2017-07-21 DIAGNOSIS — Z6826 Body mass index (BMI) 26.0-26.9, adult: Secondary | ICD-10-CM | POA: Diagnosis not present

## 2017-07-21 DIAGNOSIS — M810 Age-related osteoporosis without current pathological fracture: Secondary | ICD-10-CM | POA: Diagnosis not present

## 2017-07-21 DIAGNOSIS — E7800 Pure hypercholesterolemia, unspecified: Secondary | ICD-10-CM | POA: Diagnosis not present

## 2017-07-21 DIAGNOSIS — Z0000 Encounter for general adult medical examination without abnormal findings: Secondary | ICD-10-CM | POA: Diagnosis not present

## 2018-01-04 DIAGNOSIS — N390 Urinary tract infection, site not specified: Secondary | ICD-10-CM | POA: Diagnosis not present

## 2018-01-15 DIAGNOSIS — N390 Urinary tract infection, site not specified: Secondary | ICD-10-CM | POA: Diagnosis not present

## 2018-01-25 DIAGNOSIS — N390 Urinary tract infection, site not specified: Secondary | ICD-10-CM | POA: Diagnosis not present

## 2018-01-29 DIAGNOSIS — L89309 Pressure ulcer of unspecified buttock, unspecified stage: Secondary | ICD-10-CM | POA: Diagnosis not present

## 2018-01-29 DIAGNOSIS — N393 Stress incontinence (female) (male): Secondary | ICD-10-CM | POA: Diagnosis not present

## 2018-01-29 DIAGNOSIS — Z6826 Body mass index (BMI) 26.0-26.9, adult: Secondary | ICD-10-CM | POA: Diagnosis not present

## 2018-01-29 DIAGNOSIS — N390 Urinary tract infection, site not specified: Secondary | ICD-10-CM | POA: Diagnosis not present

## 2018-01-29 DIAGNOSIS — N183 Chronic kidney disease, stage 3 (moderate): Secondary | ICD-10-CM | POA: Diagnosis not present

## 2018-01-29 DIAGNOSIS — I129 Hypertensive chronic kidney disease with stage 1 through stage 4 chronic kidney disease, or unspecified chronic kidney disease: Secondary | ICD-10-CM | POA: Diagnosis not present

## 2018-01-29 DIAGNOSIS — E559 Vitamin D deficiency, unspecified: Secondary | ICD-10-CM | POA: Diagnosis not present

## 2018-01-29 DIAGNOSIS — E1129 Type 2 diabetes mellitus with other diabetic kidney complication: Secondary | ICD-10-CM | POA: Diagnosis not present

## 2018-01-29 DIAGNOSIS — D692 Other nonthrombocytopenic purpura: Secondary | ICD-10-CM | POA: Diagnosis not present

## 2018-01-29 DIAGNOSIS — E038 Other specified hypothyroidism: Secondary | ICD-10-CM | POA: Diagnosis not present

## 2018-01-29 DIAGNOSIS — M810 Age-related osteoporosis without current pathological fracture: Secondary | ICD-10-CM | POA: Diagnosis not present

## 2018-01-29 DIAGNOSIS — K219 Gastro-esophageal reflux disease without esophagitis: Secondary | ICD-10-CM | POA: Diagnosis not present

## 2018-03-09 DIAGNOSIS — Z6827 Body mass index (BMI) 27.0-27.9, adult: Secondary | ICD-10-CM | POA: Diagnosis not present

## 2018-03-09 DIAGNOSIS — E1129 Type 2 diabetes mellitus with other diabetic kidney complication: Secondary | ICD-10-CM | POA: Diagnosis not present

## 2018-03-09 DIAGNOSIS — L89309 Pressure ulcer of unspecified buttock, unspecified stage: Secondary | ICD-10-CM | POA: Diagnosis not present

## 2018-03-09 DIAGNOSIS — I10 Essential (primary) hypertension: Secondary | ICD-10-CM | POA: Diagnosis not present

## 2018-03-15 ENCOUNTER — Emergency Department (HOSPITAL_BASED_OUTPATIENT_CLINIC_OR_DEPARTMENT_OTHER)
Admit: 2018-03-15 | Discharge: 2018-03-15 | Disposition: A | Discharge status: Home / Self Care | Payer: PPO | Attending: Emergency Medicine | Admitting: Emergency Medicine

## 2018-03-15 ENCOUNTER — Emergency Department (HOSPITAL_COMMUNITY): Payer: PPO

## 2018-03-15 ENCOUNTER — Inpatient Hospital Stay (HOSPITAL_COMMUNITY): Payer: PPO

## 2018-03-15 ENCOUNTER — Inpatient Hospital Stay (HOSPITAL_COMMUNITY)
Admission: EM | Admit: 2018-03-15 | Discharge: 2018-03-22 | DRG: 602 | Disposition: A | Discharge status: Skilled Nursing Facility | Payer: PPO | Attending: Internal Medicine | Admitting: Internal Medicine

## 2018-03-15 ENCOUNTER — Encounter (HOSPITAL_COMMUNITY): Payer: Self-pay

## 2018-03-15 DIAGNOSIS — R269 Unspecified abnormalities of gait and mobility: Secondary | ICD-10-CM | POA: Diagnosis present

## 2018-03-15 DIAGNOSIS — N289 Disorder of kidney and ureter, unspecified: Secondary | ICD-10-CM

## 2018-03-15 DIAGNOSIS — I10 Essential (primary) hypertension: Secondary | ICD-10-CM | POA: Diagnosis not present

## 2018-03-15 DIAGNOSIS — Z7982 Long term (current) use of aspirin: Secondary | ICD-10-CM | POA: Diagnosis not present

## 2018-03-15 DIAGNOSIS — J9811 Atelectasis: Secondary | ICD-10-CM | POA: Diagnosis not present

## 2018-03-15 DIAGNOSIS — R0902 Hypoxemia: Secondary | ICD-10-CM

## 2018-03-15 DIAGNOSIS — L03116 Cellulitis of left lower limb: Secondary | ICD-10-CM

## 2018-03-15 DIAGNOSIS — M7989 Other specified soft tissue disorders: Secondary | ICD-10-CM

## 2018-03-15 DIAGNOSIS — N183 Chronic kidney disease, stage 3 (moderate): Secondary | ICD-10-CM | POA: Diagnosis not present

## 2018-03-15 DIAGNOSIS — Z7984 Long term (current) use of oral hypoglycemic drugs: Secondary | ICD-10-CM | POA: Diagnosis not present

## 2018-03-15 DIAGNOSIS — J9601 Acute respiratory failure with hypoxia: Secondary | ICD-10-CM | POA: Diagnosis not present

## 2018-03-15 DIAGNOSIS — Z66 Do not resuscitate: Secondary | ICD-10-CM | POA: Diagnosis present

## 2018-03-15 DIAGNOSIS — D649 Anemia, unspecified: Secondary | ICD-10-CM | POA: Diagnosis not present

## 2018-03-15 DIAGNOSIS — D509 Iron deficiency anemia, unspecified: Secondary | ICD-10-CM | POA: Diagnosis present

## 2018-03-15 DIAGNOSIS — R2681 Unsteadiness on feet: Secondary | ICD-10-CM | POA: Diagnosis not present

## 2018-03-15 DIAGNOSIS — E1122 Type 2 diabetes mellitus with diabetic chronic kidney disease: Secondary | ICD-10-CM | POA: Diagnosis present

## 2018-03-15 DIAGNOSIS — R2240 Localized swelling, mass and lump, unspecified lower limb: Secondary | ICD-10-CM | POA: Diagnosis not present

## 2018-03-15 DIAGNOSIS — E119 Type 2 diabetes mellitus without complications: Secondary | ICD-10-CM

## 2018-03-15 DIAGNOSIS — K219 Gastro-esophageal reflux disease without esophagitis: Secondary | ICD-10-CM | POA: Diagnosis not present

## 2018-03-15 DIAGNOSIS — E872 Acidosis, unspecified: Secondary | ICD-10-CM

## 2018-03-15 DIAGNOSIS — L8990 Pressure ulcer of unspecified site, unspecified stage: Secondary | ICD-10-CM

## 2018-03-15 DIAGNOSIS — J811 Chronic pulmonary edema: Secondary | ICD-10-CM | POA: Diagnosis not present

## 2018-03-15 DIAGNOSIS — L89152 Pressure ulcer of sacral region, stage 2: Secondary | ICD-10-CM | POA: Diagnosis present

## 2018-03-15 DIAGNOSIS — L03115 Cellulitis of right lower limb: Secondary | ICD-10-CM | POA: Diagnosis not present

## 2018-03-15 DIAGNOSIS — Z79890 Hormone replacement therapy (postmenopausal): Secondary | ICD-10-CM

## 2018-03-15 DIAGNOSIS — L539 Erythematous condition, unspecified: Secondary | ICD-10-CM | POA: Diagnosis not present

## 2018-03-15 DIAGNOSIS — I129 Hypertensive chronic kidney disease with stage 1 through stage 4 chronic kidney disease, or unspecified chronic kidney disease: Secondary | ICD-10-CM | POA: Diagnosis not present

## 2018-03-15 DIAGNOSIS — R0602 Shortness of breath: Secondary | ICD-10-CM | POA: Diagnosis not present

## 2018-03-15 DIAGNOSIS — J45909 Unspecified asthma, uncomplicated: Secondary | ICD-10-CM | POA: Diagnosis not present

## 2018-03-15 DIAGNOSIS — E039 Hypothyroidism, unspecified: Secondary | ICD-10-CM | POA: Diagnosis not present

## 2018-03-15 DIAGNOSIS — M6281 Muscle weakness (generalized): Secondary | ICD-10-CM | POA: Diagnosis not present

## 2018-03-15 DIAGNOSIS — R488 Other symbolic dysfunctions: Secondary | ICD-10-CM | POA: Diagnosis not present

## 2018-03-15 HISTORY — DX: Cellulitis of unspecified part of limb: L03.119

## 2018-03-15 HISTORY — DX: Cutaneous abscess of limb, unspecified: L02.419

## 2018-03-15 LAB — RETICULOCYTES
RBC.: 3.14 MIL/uL — ABNORMAL LOW (ref 3.87–5.11)
RETIC COUNT ABSOLUTE: 50.2 10*3/uL (ref 19.0–186.0)
Retic Ct Pct: 1.6 % (ref 0.4–3.1)

## 2018-03-15 LAB — CBC WITH DIFFERENTIAL/PLATELET
Basophils Absolute: 0 10*3/uL (ref 0.0–0.1)
Basophils Relative: 0 %
EOS ABS: 0.3 10*3/uL (ref 0.0–0.7)
EOS PCT: 3 %
HCT: 29.6 % — ABNORMAL LOW (ref 36.0–46.0)
HEMOGLOBIN: 8.5 g/dL — AB (ref 12.0–15.0)
LYMPHS ABS: 1.1 10*3/uL (ref 0.7–4.0)
Lymphocytes Relative: 12 %
MCH: 24.1 pg — ABNORMAL LOW (ref 26.0–34.0)
MCHC: 28.7 g/dL — ABNORMAL LOW (ref 30.0–36.0)
MCV: 83.9 fL (ref 78.0–100.0)
MONOS PCT: 8 %
Monocytes Absolute: 0.8 10*3/uL (ref 0.1–1.0)
NEUTROS PCT: 77 %
Neutro Abs: 7.3 10*3/uL (ref 1.7–7.7)
Platelets: 324 10*3/uL (ref 150–400)
RBC: 3.53 MIL/uL — ABNORMAL LOW (ref 3.87–5.11)
RDW: 17.1 % — ABNORMAL HIGH (ref 11.5–15.5)
WBC: 9.5 10*3/uL (ref 4.0–10.5)

## 2018-03-15 LAB — COMPREHENSIVE METABOLIC PANEL
ALBUMIN: 2.9 g/dL — AB (ref 3.5–5.0)
ALT: 13 U/L — AB (ref 14–54)
AST: 18 U/L (ref 15–41)
Alkaline Phosphatase: 117 U/L (ref 38–126)
Anion gap: 10 (ref 5–15)
BUN: 27 mg/dL — AB (ref 6–20)
CHLORIDE: 102 mmol/L (ref 101–111)
CO2: 27 mmol/L (ref 22–32)
CREATININE: 1.38 mg/dL — AB (ref 0.44–1.00)
Calcium: 9.2 mg/dL (ref 8.9–10.3)
GFR calc Af Amer: 37 mL/min — ABNORMAL LOW (ref >60)
GFR calc non Af Amer: 32 mL/min — ABNORMAL LOW (ref >60)
GLUCOSE: 125 mg/dL — AB (ref 65–99)
Potassium: 4.7 mmol/L (ref 3.5–5.1)
SODIUM: 139 mmol/L (ref 135–145)
Total Bilirubin: 0.6 mg/dL (ref 0.3–1.2)
Total Protein: 6.8 g/dL (ref 6.5–8.1)

## 2018-03-15 LAB — I-STAT CG4 LACTIC ACID, ED
LACTIC ACID, VENOUS: 1.37 mmol/L (ref 0.5–1.9)
LACTIC ACID, VENOUS: 2.28 mmol/L — AB (ref 0.5–1.9)

## 2018-03-15 LAB — IRON AND TIBC
IRON: 14 ug/dL — AB (ref 28–170)
Saturation Ratios: 4 % — ABNORMAL LOW (ref 10.4–31.8)
TIBC: 371 ug/dL (ref 250–450)
UIBC: 357 ug/dL

## 2018-03-15 LAB — FOLATE: Folate: 28 ng/mL (ref >5.9)

## 2018-03-15 LAB — FERRITIN: Ferritin: 10 ng/mL — ABNORMAL LOW (ref 11–307)

## 2018-03-15 LAB — GLUCOSE, CAPILLARY: Glucose-Capillary: 101 mg/dL — ABNORMAL HIGH (ref 65–99)

## 2018-03-15 LAB — VITAMIN B12: Vitamin B-12: 467 pg/mL (ref 180–914)

## 2018-03-15 MED ORDER — SODIUM CHLORIDE 0.9 % IV SOLN
1.0000 g | Freq: Once | INTRAVENOUS | Status: AC
  Administered 2018-03-15: 1 g via INTRAVENOUS
  Filled 2018-03-15: qty 10

## 2018-03-15 MED ORDER — SODIUM CHLORIDE 0.9 % IV SOLN
INTRAVENOUS | Status: AC
  Administered 2018-03-15: 23:00:00 via INTRAVENOUS

## 2018-03-15 MED ORDER — ACETAMINOPHEN 650 MG RE SUPP: 650.0000 mg | Freq: Four times a day (QID) | RECTAL | Status: DC | PRN

## 2018-03-15 MED ORDER — CALCIUM CARBONATE-VITAMIN D 500-200 MG-UNIT PO TABS
1.0000 | ORAL_TABLET | Freq: Every day | ORAL | Status: DC
  Administered 2018-03-16 – 2018-03-22 (×7): 1 via ORAL
  Filled 2018-03-15 (×7): qty 1

## 2018-03-15 MED ORDER — ACETAMINOPHEN 325 MG PO TABS
650.0000 mg | ORAL_TABLET | Freq: Four times a day (QID) | ORAL | Status: DC | PRN
  Administered 2018-03-17 – 2018-03-18 (×2): 650 mg via ORAL
  Filled 2018-03-15 (×2): qty 2

## 2018-03-15 MED ORDER — HYDROCODONE-ACETAMINOPHEN 5-325 MG PO TABS
1.0000 | ORAL_TABLET | ORAL | Status: DC | PRN
  Administered 2018-03-22: 1 via ORAL
  Filled 2018-03-15 (×2): qty 1

## 2018-03-15 MED ORDER — SODIUM CHLORIDE 0.9 % IV SOLN
2.0000 g | INTRAVENOUS | Status: DC
  Administered 2018-03-16: 2 g via INTRAVENOUS
  Filled 2018-03-15: qty 20

## 2018-03-15 MED ORDER — ALBUTEROL SULFATE (2.5 MG/3ML) 0.083% IN NEBU: 2.5000 mg | INHALATION_SOLUTION | Freq: Four times a day (QID) | RESPIRATORY_TRACT | Status: DC | PRN

## 2018-03-15 MED ORDER — ONDANSETRON HCL 4 MG/2ML IJ SOLN: 4.0000 mg | Freq: Four times a day (QID) | INTRAMUSCULAR | Status: DC | PRN

## 2018-03-15 MED ORDER — INSULIN ASPART 100 UNIT/ML ~~LOC~~ SOLN
0.0000 [IU] | Freq: Three times a day (TID) | SUBCUTANEOUS | Status: DC
  Administered 2018-03-16: 1 [IU] via SUBCUTANEOUS
  Administered 2018-03-16 – 2018-03-17 (×2): 2 [IU] via SUBCUTANEOUS
  Administered 2018-03-21: 1 [IU] via SUBCUTANEOUS

## 2018-03-15 MED ORDER — ONDANSETRON HCL 4 MG PO TABS
4.0000 mg | ORAL_TABLET | Freq: Four times a day (QID) | ORAL | Status: DC | PRN
  Administered 2018-03-19 – 2018-03-21 (×2): 4 mg via ORAL
  Filled 2018-03-15 (×2): qty 1

## 2018-03-15 MED ORDER — LEVOTHYROXINE SODIUM 112 MCG PO TABS
112.0000 ug | ORAL_TABLET | ORAL | Status: DC
  Administered 2018-03-16 – 2018-03-22 (×6): 112 ug via ORAL
  Filled 2018-03-15 (×6): qty 1

## 2018-03-15 MED ORDER — VANCOMYCIN HCL 500 MG IV SOLR
500.0000 mg | INTRAVENOUS | Status: DC
  Administered 2018-03-16 – 2018-03-19 (×4): 500 mg via INTRAVENOUS
  Filled 2018-03-15 (×5): qty 500

## 2018-03-15 MED ORDER — DM-DOXYLAMINE-ACETAMINOPHEN 30-12.5-1000 MG/30ML PO LIQD: 10.0000 mL | Freq: Two times a day (BID) | ORAL | Status: DC | PRN

## 2018-03-15 MED ORDER — PANTOPRAZOLE SODIUM 20 MG PO TBEC
20.0000 mg | DELAYED_RELEASE_TABLET | Freq: Two times a day (BID) | ORAL | Status: DC
  Administered 2018-03-16 – 2018-03-22 (×14): 20 mg via ORAL
  Filled 2018-03-15 (×17): qty 1

## 2018-03-15 MED ORDER — VANCOMYCIN HCL IN DEXTROSE 1-5 GM/200ML-% IV SOLN
1000.0000 mg | Freq: Once | INTRAVENOUS | Status: AC
  Administered 2018-03-15: 1000 mg via INTRAVENOUS
  Filled 2018-03-15: qty 200

## 2018-03-15 MED ORDER — DOCUSATE SODIUM 100 MG PO CAPS
100.0000 mg | ORAL_CAPSULE | Freq: Two times a day (BID) | ORAL | Status: DC | PRN
  Administered 2018-03-16: 100 mg via ORAL

## 2018-03-15 MED ORDER — SALINE SPRAY 0.65 % NA SOLN
1.0000 | Freq: Every evening | NASAL | Status: DC | PRN
  Filled 2018-03-15: qty 44

## 2018-03-15 NOTE — ED Notes (Signed)
Marked pt's legs using skin markers for signs of redness increasing per EDP

## 2018-03-15 NOTE — ED Notes (Signed)
Ultrasound at bedside

## 2018-03-15 NOTE — ED Notes (Signed)
Patient transported to X-ray 

## 2018-03-15 NOTE — H&P (Signed)
History and Physical    Casey Watts SUP:[id number] DOB: 1924-01-05 DOA: 03/15/2018  Referring MD/NP/PA: Dr. Jola Schmidt PCP: Osborne Casco, Fransico Him, MD  Patient coming from: Home  Chief Complaint: Left leg swelling  I have personally briefly reviewed patient's old medical records in [place]   HPI: Casey Watts is a 84 y.o. female with medical history significant of HTN, hypothyroidism, anemia, DM type II, and GERD; who presents with complaints of left leg swelling.  Patient is able to provide her own history.  At baseline she lives alone with home health care services and ambulates with use of a cane or walker.  Over the last 2 days she reports having progressive swelling of the left leg.  Subsequently, there is note of redness moving up her leg with increased warmth.  She states that she may intermittently scratch her legs, but does not report any other significant injury or trauma to the onset of symptoms.  Denies any significant pain in her legs, fever, chills, shortness of breath, chest pain, nausea, vomiting, blood in stool/urine, diarrhea, or dysuria.  She reports never having similar symptoms like this in the past.  She makes note that she may have been on antibiotics in the last week, but does not recall why or what medicine she was taking.  ED Course: Into the emergency department patient was noted to be afebrile, blood pressure 127/54-173/69, and all other vital signs relatively within normal limits.  Labs are significant for WBC 9.5, hemoglobin 8.5, BUN 27, creatinine 1.38, and repeat lactic acid of 2.28.  Vascular doppler ultrasound of the lower extremities showed no acute signs of DVT.  Redness and erythema was noted to the right lower extremity while patient was in the ED.  She was started on empiric antibiotics of vancomycin and ceftriaxone.  TRH called to admit.  Review of Systems  Constitutional: Negative for chills and fever.  HENT: Negative for congestion and ear  discharge.   Eyes: Negative for double vision and photophobia.  Respiratory: Negative for cough and shortness of breath.   Cardiovascular: Positive for leg swelling. Negative for chest pain.  Gastrointestinal: Negative for abdominal pain, nausea and vomiting.  Genitourinary: Negative for dysuria and frequency.  Musculoskeletal: Positive for joint pain. Negative for falls.  Skin: Positive for rash.       Positive for skin color change  Neurological: Negative for focal weakness and loss of consciousness.  Endo/Heme/Allergies: Negative for polydipsia. Bruises/bleeds easily.  Psychiatric/Behavioral: Positive for memory loss (mild). Negative for substance abuse.    Past Medical History:  Diagnosis Date  . Anemia   . Arthritis   . Asthma    as a child  . Cancer ([place])    skin cancer on left leg  . Diabetes mellitus    TYPE 2  . Esophageal stricture   . GERD (gastroesophageal reflux disease)   . Hypertension   . Hypothyroidism   . Thyroid disease     Past Surgical History:  Procedure Laterality Date  . APPENDECTOMY    . CATARACT EXTRACTION Left   . EYE SURGERY    . FOOT SURGERY Bilateral    Bunionectomy  . REVERSE SHOULDER ARTHROPLASTY Left 02/04/2014   DR MURPHY  . REVERSE SHOULDER ARTHROPLASTY Left 02/04/2014   Procedure: REVERSE SHOULDER ARTHROPLASTY;  Surgeon: Renette Butters, MD;  Location: [facility];  Service: Orthopedics;  Laterality: Left;     reports that she has never smoked. She has never used smokeless tobacco. She  reports that she drinks alcohol. She reports that she does not use drugs.  No Known Allergies  Family History  Problem Relation Age of Onset  . Heart disease Brother   . Colon cancer Neg Hx   . Cancer Neg Hx     Prior to Admission medications   Medication Sig Start Date End Date Taking? Authorizing Provider  Acetaminophen (TYLENOL 8 HOUR ARTHRITIS PAIN PO) Take 2 tablets by mouth daily as needed (pain).   Yes [provider]    calcium-vitamin D (OSCAL WITH D) 500-200 MG-UNIT per tablet Take 1 tablet by mouth daily with breakfast.    Yes [provider]  cholecalciferol (VITAMIN D) 1000 UNITS tablet Take 1,000 Units by mouth daily.   Yes [provider]  diclofenac (VOLTAREN) 75 MG EC tablet Take 75 mg by mouth 2 (two) times daily.   Yes [provider]  DM-Doxylamine-Acetaminophen (DELSYM NIGHT TIME COUGH/COLD) 30-12.04-999 MG/30ML LIQD Take 10 mLs by mouth every 12 (twelve) hours as needed (cold/cough).    Yes [provider]  docusate sodium (COLACE) 100 MG capsule Take 1 capsule (100 mg total) by mouth 2 (two) times daily as needed for mild constipation. 01/23/14  Yes Kingsley Spittle, MD  glimepiride (AMARYL) 2 MG tablet Take 2 mg by mouth daily with breakfast.   Yes [provider]  HYDROcodone-acetaminophen (NORCO/VICODIN) 5-325 MG per tablet Take 1 tablet by mouth every 4 (four) hours as needed for moderate pain. Take one tablet by mouth every 4 hours as needed for pain 03/20/14  Yes Lauree Chandler, NP  irbesartan-hydrochlorothiazide (AVALIDE) 150-12.5 MG tablet Take 1 tablet by mouth daily.   Yes [provider]  lansoprazole (PREVACID) 15 MG capsule Take 1 capsule (15 mg total) by mouth 2 (two) times daily before a meal. 05/03/17  Yes Bonnielee Haff, MD  levothyroxine (SYNTHROID, LEVOTHROID) 112 MCG tablet Take 112 mcg by mouth daily. 1/2 tab on Sunday   Yes [provider]  SIMPLY SALINE NA Place 1 spray into both nostrils at bedtime as needed (nasal congestion).   Yes [provider]  vitamin C (ASCORBIC ACID) 500 MG tablet Take 500 mg by mouth daily.   Yes [provider]    Physical Exam:  Constitutional: Elderly female in NAD, calm, and comfortable Vitals:   03/15/18 2015 03/15/18 2030 03/15/18 2100 03/15/18 2104  BP: (!) 151/52 (!) 154/52 (!) 155/65 (!) 155/65  Pulse: 77 68 77 72  Resp:    16  Temp:      TempSrc:       SpO2: 96% 94% 96% 96%   Eyes: PERRL, lids and conjunctivae normal ENMT: Mucous membranes are dry. Posterior pharynx clear of any exudate or lesions.  Neck: normal, supple, no masses, no thyromegaly Respiratory: clear to auscultation bilaterally, no wheezing, no crackles. Normal respiratory effort. No accessory muscle use.  Cardiovascular: Regular rate and rhythm, no murmurs / rubs / gallops. 1+ pitting of the bilateral lower extremity edema. 2+ pedal pulses. No carotid bruits.  Abdomen: no tenderness, no masses palpated. No hepatosplenomegaly. Bowel sounds positive.  Musculoskeletal: no clubbing / cyanosis. No joint deformity upper and lower extremities. Good ROM, no contractures. Normal muscle tone.  Skin: Erythema with increased warmth noted of the left lower extremity from the ankle to the mid tibia with some intact blisters with serous fluid. Mild erythema noted on the right lower extremity near the ankle as seen below.    Neurologic: CN 2-12 grossly intact. Sensation  intact, DTR normal. Strength 5/5 in all 4.  Psychiatric: Normal judgment and insight. Alert and oriented x 3. Normal mood.     Labs on Admission: I have personally reviewed following labs and imaging studies  CBC: Recent Labs  Lab 03/15/18 1253  WBC 9.5  NEUTROABS 7.3  HGB 8.5*  HCT 29.6*  MCV 83.9  PLT 500   Basic Metabolic Panel: Recent Labs  Lab 03/15/18 1253  NA 139  K 4.7  CL 102  CO2 27  GLUCOSE 125*  BUN 27*  CREATININE 1.38*  CALCIUM 9.2   GFR: CrCl cannot be calculated (Unknown ideal weight.). Liver Function Tests: Recent Labs  Lab 03/15/18 1253  AST 18  ALT 13*  ALKPHOS 117  BILITOT 0.6  PROT 6.8  ALBUMIN 2.9*   No results for input(s): LIPASE, AMYLASE in the last 168 hours. No results for input(s): AMMONIA in the last 168 hours. Coagulation Profile: No results for input(s): INR, PROTIME in the last 168 hours. Cardiac Enzymes: No results for input(s): CKTOTAL, CKMB,  CKMBINDEX, TROPONINI in the last 168 hours. BNP (last 3 results) No results for input(s): PROBNP in the last 8760 hours. HbA1C: No results for input(s): HGBA1C in the last 72 hours. CBG: No results for input(s): GLUCAP in the last 168 hours. Lipid Profile: No results for input(s): CHOL, HDL, LDLCALC, TRIG, CHOLHDL, LDLDIRECT in the last 72 hours. Thyroid Function Tests: No results for input(s): TSH, T4TOTAL, FREET4, T3FREE, THYROIDAB in the last 72 hours. Anemia Panel: Recent Labs    03/15/18 1716  FOLATE 28.0  RETICCTPCT 1.6   Urine analysis:    Component Value Date/Time   COLORURINE YELLOW 05/02/2017 1222   APPEARANCEUR HAZY (A) 05/02/2017 1222   LABSPEC 1.014 05/02/2017 1222   PHURINE 6.0 05/02/2017 1222   GLUCOSEU NEGATIVE 05/02/2017 1222   HGBUR MODERATE (A) 05/02/2017 1222   BILIRUBINUR NEGATIVE 05/02/2017 1222   KETONESUR NEGATIVE 05/02/2017 1222   PROTEINUR NEGATIVE 05/02/2017 1222   NITRITE POSITIVE (A) 05/02/2017 1222   LEUKOCYTESUR LARGE (A) 05/02/2017 1222   Sepsis Labs: No results found for this or any previous visit (from the past 240 hour(s)).   Radiological Exams on Admission: Dg Tibia/fibula Left  Result Date: 03/15/2018 CLINICAL DATA:  Pain and swelling for 2 days. EXAM: LEFT TIBIA AND FIBULA - 2 VIEW COMPARISON:  None. FINDINGS: Diffuse subcutaneous soft tissue swelling/edema. No gas is seen in the soft tissues. Moderate to advanced degenerative changes involving the knee joint. The ankle joint is maintained. No acute bony findings or destructive bony changes. Vascular calcifications are noted. IMPRESSION: No acute bony findings. Diffuse soft tissue swelling/edema. Electronically Signed   By: Marijo Sanes M.D.   On: 03/15/2018 18:45    x-rays of left tibia and fibula: Independently reviewed.  Soft tissue swelling noted no significant bony abnormalities  Assessment/Plan Cellulitis of the left lower extremity: Acute.  Patient presents on left lower  extremity swelling and erythema.  Vascular Doppler ultrasound negative for signs of a DVT.  Patient empirically started on antibiotics of vancomycin and Rocephin.  Patient does not show any significant signs of heart failure.  - Admit to a MedSurg bed - Check blood cultures, ESR, CRP - Continue empiric antibiotics of vancomycin and Rocephin  Lactic acidosis: Acute.  Patient's lactic acid trended up to 2.28. - Gentle IV fluids  - Trend lactic acid levels  Hypochromic anemia: Acute on chronic.  Patient's baseline hemoglobin appears to be around 10-12, but she presents with hemoglobin  of 8.5 on admission.  Patient denies any acute bleeding - Follow-up anemia panel - Type and screen - Check stool guaiac  Renal insufficiency: Patient's baseline creatinine appears to be around 1.2, but presents with a creatinine of 1.38 with BUN 27. - Normal saline IV fluids at 75 mL/h overnight  Diabetes mellitus type 2: Well controlled.  Patient only on oral medications of Amaryl.  Initial CBG 127 on admission. - Hypoglycemic protocol - Hold Amaryl - CBGs q. before meals with sensitive SSI  Essential hypertension: Stable. - Continue ibesartan - Held hydrochlorothiazide   Hypothyroidism - Add on TSH - Continue levothyroxine  GERD - Continue pharmacy substitution of Protonix for Prevacid  DVT prophylaxis: SCDs Code Status: DNR  Family Communication: No family present at bedside Disposition Plan: TBD Consults called:none  Admission status: Inpatient  Norval Morton MD [facility] Pager [phone]   If 7PM-7AM, please contact night-coverage [email or web address] Password [email or web address]  03/15/2018, 9:30 PM

## 2018-03-15 NOTE — Progress Notes (Signed)
*  Preliminary Results* Left lower extremity venous duplex completed. There is no obvious evidence of acute deep vein thrombosis in the left lower extremity. There is no evidence of left Baker's cyst.  Preliminary results discussed with Dr. Venora Maples.  03/15/2018 6:36 PM  Maudry Mayhew, BS, RVT, RDCS, RDMS

## 2018-03-15 NOTE — ED Notes (Signed)
Pts left leg, starting from bellow the knee to the ankle, is red, hot, swollen, and has pitting edema. Pt states that the leg "feels tight".

## 2018-03-15 NOTE — ED Notes (Signed)
Pt ambulated to and from restroom with two stand by assists.

## 2018-03-15 NOTE — ED Notes (Signed)
Purewick applied at 19:55.

## 2018-03-15 NOTE — ED Notes (Signed)
Dr. Venora Maples aware of pts lactic acid.

## 2018-03-15 NOTE — ED Provider Notes (Signed)
[facility] EMERGENCY DEPARTMENT Provider Note   CSN: [id number] Arrival date & time: 03/15/18  1250     History   Chief Complaint Chief Complaint  Patient presents with  . Leg Swelling    HPI Casey Watts is a 84 y.o. female.  HPI Patient is a 84 year old female who presents to the emergency department with complaints of developing erythema of her left lower leg over the past 24 hours.  Patient denies significant pain in the left leg.  No recent injury or trauma.  She states she feels as though the leg is "tight".  No history of DVT or pulmonary embolism.  No history of cellulitis.  She does not have diabetes.  No fevers or chills    Past Medical History:  Diagnosis Date  . Anemia   . Arthritis   . Asthma    as a child  . Cancer ([place])    skin cancer on left leg  . Diabetes mellitus    TYPE 2  . Esophageal stricture   . GERD (gastroesophageal reflux disease)   . Hypertension   . Hypothyroidism   . Thyroid disease     Patient Active Problem List   Diagnosis Date Noted  . Chest pain 05/02/2017  . Diabetes mellitus with complication ([place]) 43/56/8616  . Renal insufficiency 05/02/2017  . Leukocytosis 05/02/2017  . Chronic pain 05/02/2017  . Atypical chest pain   . GERD (gastroesophageal reflux disease) 02/13/2014  . Constipation 02/13/2014  . Hypothyroidism 02/13/2014  . Hypertension 02/13/2014  . Proximal humeral fracture 02/04/2014  . Stricture and stenosis of esophagus 02/28/2011  . Chronic duodenal ulcer without mention of hemorrhage, perforation, or obstruction 02/28/2011  . DM 01/19/2011    Past Surgical History:  Procedure Laterality Date  . APPENDECTOMY    . CATARACT EXTRACTION Left   . EYE SURGERY    . FOOT SURGERY Bilateral    Bunionectomy  . REVERSE SHOULDER ARTHROPLASTY Left 02/04/2014   DR MURPHY  . REVERSE SHOULDER ARTHROPLASTY Left 02/04/2014   Procedure: REVERSE SHOULDER ARTHROPLASTY;  Surgeon: Renette Butters, MD;   Location: [facility];  Service: Orthopedics;  Laterality: Left;     OB History   None      Home Medications    Prior to Admission medications   Medication Sig Start Date End Date Taking? Authorizing Provider  acetaminophen (TYLENOL) 500 MG tablet Take 500 mg by mouth daily as needed for mild pain.     [provider]  aspirin 81 MG tablet Take 1 tablet (81 mg total) by mouth daily. 02/06/14   Renette Butters, MD  calcium-vitamin D (OSCAL WITH D) 500-200 MG-UNIT per tablet Take 1 tablet by mouth 2 (two) times daily.    [provider]  cholecalciferol (VITAMIN D) 1000 UNITS tablet Take 1,000 Units by mouth daily.    [provider]  DM-Doxylamine-Acetaminophen (DELSYM NIGHT TIME COUGH/COLD) 30-12.04-999 MG/30ML LIQD Take 10 mLs by mouth every 12 (twelve) hours as needed (cold/cough). Use as needed    [provider]  docusate sodium (COLACE) 100 MG capsule Take 1 capsule (100 mg total) by mouth 2 (two) times daily as needed for mild constipation. Patient not taking: Reported on 05/02/2017 01/23/14   Kingsley Spittle, MD  FA-Cyanocobalamin-B6-D-Ca (D 1000 PLUS) TABS Take 1 tablet by mouth daily.     [provider]  glimepiride (AMARYL) 2 MG tablet Take 2 mg by mouth daily with breakfast.    [provider]  HYDROcodone-acetaminophen (NORCO/VICODIN) 5-325 MG per tablet Take 1 tablet by mouth every 4 (four) hours as needed. Take one tablet by mouth every 4 hours as needed for pain 03/20/14   Lauree Chandler, NP  insulin lispro (HUMALOG) 100 UNIT/ML injection Inject 5 Units into the skin 2 (two) times daily. For CBG>250    [provider]  irbesartan-hydrochlorothiazide (AVALIDE) 150-12.5 MG tablet Take 1 tablet by mouth daily.    [provider]  lansoprazole (PREVACID) 15 MG capsule Take 1 capsule (15 mg total) by mouth 2 (two) times daily before a meal. 05/03/17   Bonnielee Haff, MD  levothyroxine (SYNTHROID, LEVOTHROID) 112 MCG  tablet Take 112 mcg by mouth daily. 1/2 tab on Sunday    [provider]  SIMPLY SALINE NA Place 1 spray into both nostrils at bedtime as needed (nasal congestion).    [provider]  sucralfate (CARAFATE) 1 GM/10ML suspension Take 10 mLs (1 g total) by mouth 4 (four) times daily -  with meals and at bedtime. 05/03/17 05/13/17  Bonnielee Haff, MD    Family History Family History  Problem Relation Age of Onset  . Heart disease Brother   . Colon cancer Neg Hx   . Cancer Neg Hx     Social History Social History   Tobacco Use  . Smoking status: Never Smoker  . Smokeless tobacco: Never Used  Substance Use Topics  . Alcohol use: Yes    Comment: occasional  . Drug use: No     Allergies   Patient has no known allergies.   Review of Systems Review of Systems  All other systems reviewed and are negative.     Physical Exam Updated Vital Signs BP (!) 155/65 (BP Location: Right Arm)   Pulse 72   Temp 98.9 F (37.2 C) (Oral)   Resp 16   SpO2 96%   Physical Exam  Constitutional: She is oriented to person, place, and time. She appears well-developed and well-nourished. No distress.  HENT:  Head: Normocephalic and atraumatic.  Eyes: EOM are normal.  Neck: Normal range of motion.  Cardiovascular: Normal rate and regular rhythm.  Pulmonary/Chest: Effort normal and breath sounds normal.  Abdominal: Soft. She exhibits no distension. There is no tenderness.  Musculoskeletal: Normal range of motion.  Erythema of the left lower extremity from the level of the mid dorsum of her left foot to just below the left patella.  The majority of the erythema is anterior with some radiation to both the lateral medial side.  No posterior erythema.  Normal PT and DP pulse in left foot.  Full range of motion of left ankle and left knee.  Neurological: She is alert and oriented to person, place, and time.  Skin: Skin is warm and dry.  Psychiatric: She has a normal mood and affect.  Judgment normal.  Nursing note and vitals reviewed.    ED Treatments / Results  Labs (all labs ordered are listed, but only abnormal results are displayed) Labs Reviewed  COMPREHENSIVE METABOLIC PANEL - Abnormal; Notable for the following components:      Result Value   Glucose, Bld 125 (*)    BUN 27 (*)    Creatinine, Ser 1.38 (*)    Albumin 2.9 (*)    ALT 13 (*)    GFR calc non Af Amer 32 (*)    GFR calc Af Amer 37 (*)    All other components within normal limits  CBC WITH DIFFERENTIAL/PLATELET - Abnormal; Notable  for the following components:   RBC 3.53 (*)    Hemoglobin 8.5 (*)    HCT 29.6 (*)    MCH 24.1 (*)    MCHC 28.7 (*)    RDW 17.1 (*)    All other components within normal limits  RETICULOCYTES - Abnormal; Notable for the following components:   RBC. 3.14 (*)    All other components within normal limits  I-STAT CG4 LACTIC ACID, ED - Abnormal; Notable for the following components:   Lactic Acid, Venous 2.28 (*)    All other components within normal limits  FOLATE  VITAMIN B12  IRON AND TIBC  FERRITIN  I-STAT CG4 LACTIC ACID, ED    EKG None  Radiology Dg Tibia/fibula Left  Result Date: 03/15/2018 CLINICAL DATA:  Pain and swelling for 2 days. EXAM: LEFT TIBIA AND FIBULA - 2 VIEW COMPARISON:  None. FINDINGS: Diffuse subcutaneous soft tissue swelling/edema. No gas is seen in the soft tissues. Moderate to advanced degenerative changes involving the knee joint. The ankle joint is maintained. No acute bony findings or destructive bony changes. Vascular calcifications are noted. IMPRESSION: No acute bony findings. Diffuse soft tissue swelling/edema. Electronically Signed   By: Marijo Sanes M.D.   On: 03/15/2018 18:45    Procedures Procedures (including critical care time)  Medications Ordered in ED Medications  vancomycin (VANCOCIN) IVPB 1000 mg/200 mL premix (0 mg Intravenous Stopped 03/15/18 1854)  cefTRIAXone (ROCEPHIN) 1 g in sodium chloride 0.9 % 100 mL  IVPB (0 g Intravenous Stopped 03/15/18 1948)     Initial Impression / Assessment and Plan / ED Course  I have reviewed the triage vital signs and the nursing notes.  Pertinent labs & imaging results that were available during my care of the patient were reviewed by me and considered in my medical decision making (see chart for details).     Patient with significant cellulitis of left lower extremity.  While in the emergency department receiving IV antibiotics she has developed new erythema of her right lower extremity at the right ankle.  This is concerning for progressive cellulitis.  Patient will be admitted the hospital given her worsening symptoms while in the ER and advanced age.   Final Clinical Impressions(s) / ED Diagnoses   Final diagnoses:  Cellulitis of left lower extremity    ED Discharge Orders    None       Jola Schmidt, MD 03/15/18 2121

## 2018-03-15 NOTE — ED Triage Notes (Signed)
Pt presents for evaluation of L lower leg swelling, redness, hot to touch. No fevers reported.

## 2018-03-15 NOTE — ED Notes (Signed)
Hospitalist at bedside 

## 2018-03-15 NOTE — ED Notes (Signed)
Urine and urine culture collected. Labeled and at bedside table.

## 2018-03-15 NOTE — Progress Notes (Signed)
Pharmacy Antibiotic Note  Casey Watts is a 84 y.o. female admitted on 03/15/2018 with cellulitis of left lower extremity.  Pharmacy has been consulted for vancomycin dosing.  Labs/Vitals: afebrile, WBC 9.5, LA 2.28 Scr 1.38; CrCl ~25  ED Course: Rocephin 1g, vancomycin 1g  Plan: Vancomycin 500 mg IV every 24 hours.  Goal trough 10-15 mcg/mL. Ceftriaxone 2 g every 24 hours. F/U cultures, LOT, renal function, and vanc trough as needed.    Temp (24hrs), Avg:98.9 F (37.2 C), Min:98.9 F (37.2 C), Max:98.9 F (37.2 C)  Recent Labs  Lab 03/15/18 1253 03/15/18 1306 03/15/18 1755  WBC 9.5  --   --   CREATININE 1.38*  --   --   LATICACIDVEN  --  1.37 2.28*    CrCl cannot be calculated (Unknown ideal weight.).    No Known Allergies  Antimicrobials this admission: Ceftriaxone 4/11 >> Vancomycin 4/11 >>  Dose adjustments this admission: none  Microbiology results: 4/11 BCx: pending  Thank you for allowing pharmacy to be a part of this patient's care.  Blaine Hamper Xabi Wittler 03/15/2018 10:22 PM

## 2018-03-16 ENCOUNTER — Encounter (HOSPITAL_COMMUNITY): Payer: Self-pay | Admitting: *Deleted

## 2018-03-16 ENCOUNTER — Other Ambulatory Visit: Payer: Self-pay

## 2018-03-16 DIAGNOSIS — E119 Type 2 diabetes mellitus without complications: Secondary | ICD-10-CM

## 2018-03-16 DIAGNOSIS — E872 Acidosis, unspecified: Secondary | ICD-10-CM

## 2018-03-16 DIAGNOSIS — L8990 Pressure ulcer of unspecified site, unspecified stage: Secondary | ICD-10-CM

## 2018-03-16 LAB — LACTIC ACID, PLASMA
Lactic Acid, Venous: 1.1 mmol/L (ref 0.5–1.9)
Lactic Acid, Venous: 0.9 mmol/L (ref 0.5–1.9)

## 2018-03-16 LAB — CBC
HEMATOCRIT: 27.9 % — AB (ref 36.0–46.0)
Hemoglobin: 8.1 g/dL — ABNORMAL LOW (ref 12.0–15.0)
MCH: 24.3 pg — AB (ref 26.0–34.0)
MCHC: 29 g/dL — AB (ref 30.0–36.0)
MCV: 83.5 fL (ref 78.0–100.0)
Platelets: 281 10*3/uL (ref 150–400)
RBC: 3.34 MIL/uL — ABNORMAL LOW (ref 3.87–5.11)
RDW: 17.1 % — AB (ref 11.5–15.5)
WBC: 9.9 10*3/uL (ref 4.0–10.5)

## 2018-03-16 LAB — GLUCOSE, CAPILLARY
GLUCOSE-CAPILLARY: 127 mg/dL — AB (ref 65–99)
Glucose-Capillary: 153 mg/dL — ABNORMAL HIGH (ref 65–99)
Glucose-Capillary: 60 mg/dL — ABNORMAL LOW (ref 65–99)
Glucose-Capillary: 117 mg/dL — ABNORMAL HIGH (ref 65–99)
Glucose-Capillary: 111 mg/dL — ABNORMAL HIGH (ref 65–99)

## 2018-03-16 LAB — PROTIME-INR
INR: 1.13
PROTHROMBIN TIME: 14.4 s (ref 11.4–15.2)

## 2018-03-16 LAB — BASIC METABOLIC PANEL
Anion gap: 7 (ref 5–15)
BUN: 22 mg/dL — AB (ref 6–20)
CALCIUM: 8.7 mg/dL — AB (ref 8.9–10.3)
CHLORIDE: 105 mmol/L (ref 101–111)
CO2: 28 mmol/L (ref 22–32)
CREATININE: 1.23 mg/dL — AB (ref 0.44–1.00)
GFR calc Af Amer: 43 mL/min — ABNORMAL LOW (ref >60)
GFR calc non Af Amer: 37 mL/min — ABNORMAL LOW (ref >60)
GLUCOSE: 101 mg/dL — AB (ref 65–99)
Potassium: 4.4 mmol/L (ref 3.5–5.1)
Sodium: 140 mmol/L (ref 135–145)

## 2018-03-16 LAB — SEDIMENTATION RATE: SED RATE: 58 mm/h — AB (ref 0–22)

## 2018-03-16 LAB — TYPE AND SCREEN
ABO/RH(D): O POS
Antibody Screen: NEGATIVE

## 2018-03-16 LAB — APTT: aPTT: 35 seconds (ref 24–36)

## 2018-03-16 LAB — TSH: TSH: 13.697 u[IU]/mL — ABNORMAL HIGH (ref 0.350–4.500)

## 2018-03-16 LAB — C-REACTIVE PROTEIN: CRP: 2.4 mg/dL — AB (ref <1.0)

## 2018-03-16 MED ORDER — FERROUS SULFATE 325 (65 FE) MG PO TABS
325.0000 mg | ORAL_TABLET | Freq: Every day | ORAL | Status: DC
  Administered 2018-03-16 – 2018-03-22 (×7): 325 mg via ORAL
  Filled 2018-03-16 (×7): qty 1

## 2018-03-16 MED ORDER — DOCUSATE SODIUM 100 MG PO CAPS
100.0000 mg | ORAL_CAPSULE | Freq: Every day | ORAL | Status: DC
  Administered 2018-03-17 – 2018-03-22 (×5): 100 mg via ORAL
  Filled 2018-03-16 (×7): qty 1

## 2018-03-16 MED ORDER — ENOXAPARIN SODIUM 30 MG/0.3ML ~~LOC~~ SOLN
30.0000 mg | Freq: Every day | SUBCUTANEOUS | Status: DC
  Administered 2018-03-16 – 2018-03-22 (×7): 30 mg via SUBCUTANEOUS
  Filled 2018-03-16 (×7): qty 0.3

## 2018-03-16 NOTE — Plan of Care (Signed)

## 2018-03-16 NOTE — Progress Notes (Addendum)
Patient ID: Casey Watts, female   DOB: Aug 27, 1924, 84 y.o.   MRN: [id number]                                                                PROGRESS NOTE                                                                                                                                                                                                             Patient Demographics:    Casey Watts, is a 84 y.o. female, DOB - 06/14/1924, FWY:[id number]  Admit date - 03/15/2018   Admitting Physician Norval Morton, MD  Outpatient Primary MD for the patient is Tisovec, Fransico Him, MD  LOS - 1  Outpatient Specialists:     Chief Complaint  Patient presents with  . Leg Swelling       Brief Narrative   84 year old female who presents to the emergency department with complaints of developing erythema of her left lower leg over the past 24 hours.  Patient denies significant pain in the left leg.  No recent injury or trauma.  She states she feels as though the leg is "tight".  No history of DVT or pulmonary embolism.  No history of cellulitis.  She does not have diabetes.  No fevers or chills      Subjective:    Casey Watts today has been afebrile overnite.  Redness slightly improved.   No headache, No chest pain, No abdominal pain - No Nausea, No new weakness tingling or numbness, No Cough - SOB.    Assessment  & Plan :    Active Problems:   Cellulitis   Cellulitis Cont Vanco IV pharmacy to dose Cont Rocephin   Dm2 fsbs ac and qhs, ISS  Iron Deficiency Anemia Awaiting FOBT Please f/u May need GI consult in future to further evalaute Start ferrous sulfate 325mg  po qday Start colace 100mg  po qday  Hypertension Pt doesn't appears to be continued on Avalide Will monitor bp and restart if needed  Gerd Cont PPI  Hypothyroidism Cont Levothyroxine     Code Status :  DNR  Family Communication  : w patient  Disposition Plan  : home  Barriers For Discharge :    Consults  :  none  Procedures  :  DVT Prophylaxis  :  Lovenox -- SCDs   Lab Results  Component Value Date   PLT 281 03/16/2018    Antibiotics  :  Vanco, rocephin 4/11=>  Anti-infectives (From admission, onward)   Start     Dose/Rate Route Frequency Ordered Stop   03/16/18 2200  cefTRIAXone (ROCEPHIN) 2 g in sodium chloride 0.9 % 100 mL IVPB     2 g 200 mL/hr over 30 Minutes Intravenous Every 24 hours 03/15/18 2151     03/16/18 1730  vancomycin (VANCOCIN) 500 mg in sodium chloride 0.9 % 100 mL IVPB     500 mg 100 mL/hr over 60 Minutes Intravenous Every 24 hours 03/15/18 2241     03/15/18 1700  vancomycin (VANCOCIN) IVPB 1000 mg/200 mL premix     1,000 mg 200 mL/hr over 60 Minutes Intravenous  Once 03/15/18 1658 03/15/18 1854   03/15/18 1700  cefTRIAXone (ROCEPHIN) 1 g in sodium chloride 0.9 % 100 mL IVPB     1 g 200 mL/hr over 30 Minutes Intravenous  Once 03/15/18 1658 03/15/18 1948        Objective:   Vitals:   03/15/18 2104 03/15/18 2200 03/15/18 2312 03/16/18 0609  BP: (!) 155/65 (!) 143/52 (!) 156/56 (!) 142/53  Pulse: 72 69 73 77  Resp: 16   19  Temp:   97.9 F (36.6 C) 98.4 F (36.9 C)  TempSrc:   Oral Oral  SpO2: 96% 95% 96% 95%  Weight:  59.9 kg (132 lb)      Wt Readings from Last 3 Encounters:  03/15/18 59.9 kg (132 lb)  05/03/17 60.4 kg (133 lb 3.2 oz)  03/20/14 57.6 kg (127 lb)     Intake/Output Summary (Last 24 hours) at 03/16/2018 1517 Last data filed at 03/16/2018 0610 Gross per 24 hour  Intake 920 ml  Output 50 ml  Net 870 ml     Physical Exam  Awake Alert, Oriented X 3, No new F.N deficits, Normal affect [place].AT,PERRAL Supple Neck,No JVD, No cervical lymphadenopathy appriciated.  Symmetrical Chest wall movement, Good air movement bilaterally, CTAB RRR,No Gallops,Rubs or new Murmurs, No Parasternal Heave +ve B.Sounds, Abd Soft, No tenderness, No organomegaly appriciated, No rebound - guarding or rigidity. No Cyanosis, Clubbing or  edema Redness extending about 2/3 up to knee on  the left leg     Data Review:    CBC Recent Labs  Lab 03/15/18 1253 03/16/18 0414  WBC 9.5 9.9  HGB 8.5* 8.1*  HCT 29.6* 27.9*  PLT 324 281  MCV 83.9 83.5  MCH 24.1* 24.3*  MCHC 28.7* 29.0*  RDW 17.1* 17.1*  LYMPHSABS 1.1  --   MONOABS 0.8  --   EOSABS 0.3  --   BASOSABS 0.0  --     Chemistries  Recent Labs  Lab 03/15/18 1253 03/16/18 0414  NA 139 140  K 4.7 4.4  CL 102 105  CO2 27 28  GLUCOSE 125* 101*  BUN 27* 22*  CREATININE 1.38* 1.23*  CALCIUM 9.2 8.7*  AST 18  --   ALT 13*  --   ALKPHOS 117  --   BILITOT 0.6  --    ------------------------------------------------------------------------------------------------------------------ No results for input(s): CHOL, HDL, LDLCALC, TRIG, CHOLHDL, LDLDIRECT in the last 72 hours.  No results found for: HGBA1C ------------------------------------------------------------------------------------------------------------------ Recent Labs    03/16/18 0013  TSH 13.697*   ------------------------------------------------------------------------------------------------------------------ Recent Labs    03/15/18 1716  VITAMINB12 467  FOLATE 28.0  FERRITIN 10*  TIBC  371  IRON 14*  RETICCTPCT 1.6    Coagulation profile Recent Labs  Lab 03/16/18 0013  INR 1.13    No results for input(s): DDIMER in the last 72 hours.  Cardiac Enzymes No results for input(s): CKMB, TROPONINI, MYOGLOBIN in the last 168 hours.  Invalid input(s): CK ------------------------------------------------------------------------------------------------------------------ No results found for: BNP  Inpatient Medications  Scheduled Meds: . calcium-vitamin D  1 tablet Oral Q breakfast  . insulin aspart  0-9 Units Subcutaneous TID WC  . levothyroxine  112 mcg Oral Once per day on Mon Tue Wed Thu Fri Sat  . pantoprazole  20 mg Oral BID   Continuous Infusions: . sodium chloride 75  mL/hr at 03/15/18 2316  . cefTRIAXone (ROCEPHIN)  IV    . vancomycin     PRN Meds:.acetaminophen **OR** acetaminophen, albuterol, docusate sodium, HYDROcodone-acetaminophen, ondansetron **OR** ondansetron (ZOFRAN) IV, sodium chloride  Micro Results No results found for this or any previous visit (from the past 240 hour(s)).  Radiology Reports Dg Tibia/fibula Left  Result Date: 03/15/2018 CLINICAL DATA:  Pain and swelling for 2 days. EXAM: LEFT TIBIA AND FIBULA - 2 VIEW COMPARISON:  None. FINDINGS: Diffuse subcutaneous soft tissue swelling/edema. No gas is seen in the soft tissues. Moderate to advanced degenerative changes involving the knee joint. The ankle joint is maintained. No acute bony findings or destructive bony changes. Vascular calcifications are noted. IMPRESSION: No acute bony findings. Diffuse soft tissue swelling/edema. Electronically Signed   By: Marijo Sanes M.D.   On: 03/15/2018 18:45   Dg Chest Port 1 View  Result Date: 03/15/2018 CLINICAL DATA:  Leg swelling and redness EXAM: PORTABLE CHEST 1 VIEW COMPARISON:  05/02/2017 FINDINGS: Status post left shoulder replacement. Streaky atelectasis at the left base. No focal consolidation. Stable slightly enlarged cardiomediastinal silhouette with aortic atherosclerosis. No pneumothorax. Chronic right upper rib deformity. IMPRESSION: No active disease. Minimal atelectasis at the left base. Mild cardiomegaly. Electronically Signed   By: Donavan Foil M.D.   On: 03/15/2018 22:45    Time Spent in minutes  30   Jani Gravel M.D on 03/16/2018 at 6:23 AM  Between 7am to 7pm - Pager - [phone]    After 7pm go to [email or web address] - password [facility]  [facility] -  Office  [phone]

## 2018-03-17 ENCOUNTER — Encounter (HOSPITAL_COMMUNITY): Payer: Self-pay

## 2018-03-17 DIAGNOSIS — L03115 Cellulitis of right lower limb: Secondary | ICD-10-CM

## 2018-03-17 LAB — CBC
HEMATOCRIT: 28.8 % — AB (ref 36.0–46.0)
Hemoglobin: 8.3 g/dL — ABNORMAL LOW (ref 12.0–15.0)
MCH: 23.9 pg — ABNORMAL LOW (ref 26.0–34.0)
MCHC: 28.8 g/dL — ABNORMAL LOW (ref 30.0–36.0)
MCV: 82.8 fL (ref 78.0–100.0)
Platelets: 299 10*3/uL (ref 150–400)
RBC: 3.48 MIL/uL — AB (ref 3.87–5.11)
RDW: 17.3 % — ABNORMAL HIGH (ref 11.5–15.5)
WBC: 11.7 10*3/uL — AB (ref 4.0–10.5)

## 2018-03-17 LAB — COMPREHENSIVE METABOLIC PANEL
ALBUMIN: 2.5 g/dL — AB (ref 3.5–5.0)
ALT: 11 U/L — AB (ref 14–54)
AST: 18 U/L (ref 15–41)
Alkaline Phosphatase: 108 U/L (ref 38–126)
Anion gap: 9 (ref 5–15)
BILIRUBIN TOTAL: 0.4 mg/dL (ref 0.3–1.2)
BUN: 15 mg/dL (ref 6–20)
CHLORIDE: 104 mmol/L (ref 101–111)
CO2: 27 mmol/L (ref 22–32)
CREATININE: 1.22 mg/dL — AB (ref 0.44–1.00)
Calcium: 8.8 mg/dL — ABNORMAL LOW (ref 8.9–10.3)
GFR calc Af Amer: 43 mL/min — ABNORMAL LOW (ref >60)
GFR calc non Af Amer: 37 mL/min — ABNORMAL LOW (ref >60)
GLUCOSE: 104 mg/dL — AB (ref 65–99)
Potassium: 4.1 mmol/L (ref 3.5–5.1)
Sodium: 140 mmol/L (ref 135–145)
TOTAL PROTEIN: 6.1 g/dL — AB (ref 6.5–8.1)

## 2018-03-17 LAB — GLUCOSE, CAPILLARY
GLUCOSE-CAPILLARY: 155 mg/dL — AB (ref 65–99)
GLUCOSE-CAPILLARY: 177 mg/dL — AB (ref 65–99)
GLUCOSE-CAPILLARY: 201 mg/dL — AB (ref 65–99)
Glucose-Capillary: 171 mg/dL — ABNORMAL HIGH (ref 65–99)

## 2018-03-17 MED ORDER — SODIUM CHLORIDE 0.9 % IV SOLN
1.0000 g | INTRAVENOUS | Status: DC
  Administered 2018-03-17 – 2018-03-19 (×3): 1 g via INTRAVENOUS
  Filled 2018-03-17 (×3): qty 10

## 2018-03-17 NOTE — Progress Notes (Signed)
PROGRESS NOTE    Casey Watts  ALP:[id number] DOB: 1924-06-29 DOA: 03/15/2018 PCP: Haywood Pao, MD  Outpatient Specialists:     Brief Narrative: Patient is a 84 year old Caucasian female past medical history significant for hypertension, diabetes mellitus, asthma, arthritis, anemia, GERD, esophageal stricture and history of hypothyroidism.  Patient was admitted with cellulitis involving both lower extremities, worse on the left side.  Cellulitis is improving.  We will continue current antibiotics regimen.  Was seen alongside patient's daughter-in-law and nurse.  No new complaints today.  Assessment & Plan:   Principal Problem:   Cellulitis Active Problems:   Hypothyroidism   Renal insufficiency   Pressure injury of skin   Lactic acidosis   Diabetes mellitus type 2 in nonobese (HCC)   Cellulitis of the left lower extremity: Acute.  Patient presents on left lower extremity swelling and erythema.  Vascular Doppler ultrasound negative for signs of a DVT.  Patient empirically started on antibiotics of vancomycin and Rocephin.  Patient does not show any significant signs of heart failure.  - Admit to a MedSurg bed - Check blood cultures, ESR, CRP - Continue empiric antibiotics of vancomycin and Rocephin 03/17/2018: Continue antibiotics.  The cellulitis is slowly improving.  Lactic acidosis: Acute.  Patient's lactic acid trended up to 2.28. - Gentle IV fluids  - Trend lactic acid levels -Lactic acid done on 03/16/2018 was 0.9.  Hypochromic anemia: Acute on chronic.  Patient's baseline hemoglobin appears to be around 10-12, but she presents with hemoglobin of 8.5 on admission.  Patient denies any acute bleeding - Follow-up anemia panel - Type and screen - Check stool guaiac -03/17/2018: Iron studies suggestive of iron deficiency.  Vitamin B12 level is noted. -We will check folate level and methylmalonic acid level.  Renal insufficiency: Patient's baseline creatinine  appears to be around 1.2, but presents with a creatinine of 1.38 with BUN 27. - Normal saline IV fluids at 75 mL/h overnight -03/17/2018: Patient has stable CKD 3B.  Diabetes mellitus type 2: Well controlled.  Patient only on oral medications of Amaryl.  Initial CBG 127 on admission. - Hypoglycemic protocol - Hold Amaryl - CBGs q. before meals with sensitive SSI 03/17/2018: Continue to optimize blood sugar control.  Essential hypertension: Stable. - Continue ibesartan - Held hydrochlorothiazide -03/17/2018: BP is optimized.   Hypothyroidism - Add on TSH - Continue levothyroxine -03/17/2018: TSH is 13.6.  Will defer further management to the PCP.  GERD - Continue pharmacy substitution of Protonix for Prevacid  DVT prophylaxis: SCDs Code Status: DNR  Family Communication:  Daughter-in-law. Disposition Plan: TBD Consults called:none  Admission status: Inpatient   Antimicrobials:   Vancomycin  Rocephin   Subjective: No new complaints.  No fever or chills.  No chest pain. The cellulitis is improving slowly.  Objective: Vitals:   03/16/18 1430 03/16/18 2300 03/17/18 0527 03/17/18 0837  BP: (!) 141/60 (!) 148/62 (!) 151/59 (!) 153/62  Pulse: 72 69 70 72  Resp: 16 15 16 18   Temp: 98.5 F (36.9 C) 97.9 F (36.6 C) 98.8 F (37.1 C) 98.8 F (37.1 C)  TempSrc: Oral Oral Oral Oral  SpO2: 98% 99% 94% 93%  Weight:        Intake/Output Summary (Last 24 hours) at 03/17/2018 1117 Last data filed at 03/17/2018 0600 Gross per 24 hour  Intake 480 ml  Output 800 ml  Net -320 ml   Filed Weights   03/15/18 2200  Weight: 59.9 kg (132 lb)    Examination:  General exam: Appears calm and comfortable  Respiratory system: Clear to auscultation.  Cardiovascular system: S1 & S2  Gastrointestinal system: Abdomen is nondistended, soft and nontender. No organomegaly or masses felt. Normal bowel sounds heard. Central nervous system: Alert and oriented. No focal neurological  deficits. Extremities: Symmetric 5 x 5 power.  No leg edema.  Cellulitis of bilateral lower extremities, worse on the left side.  Right lower extremity cellulitis has improved significantly. Skin: Kindly see above.  Data Reviewed: I have personally reviewed following labs and imaging studies  CBC: Recent Labs  Lab 03/15/18 1253 03/16/18 0414 03/17/18 0536  WBC 9.5 9.9 11.7*  NEUTROABS 7.3  --   --   HGB 8.5* 8.1* 8.3*  HCT 29.6* 27.9* 28.8*  MCV 83.9 83.5 82.8  PLT 324 281 579   Basic Metabolic Panel: Recent Labs  Lab 03/15/18 1253 03/16/18 0414 03/17/18 0536  NA 139 140 140  K 4.7 4.4 4.1  CL 102 105 104  CO2 27 28 27   GLUCOSE 125* 101* 104*  BUN 27* 22* 15  CREATININE 1.38* 1.23* 1.22*  CALCIUM 9.2 8.7* 8.8*   GFR: CrCl cannot be calculated (Unknown ideal weight.). Liver Function Tests: Recent Labs  Lab 03/15/18 1253 03/17/18 0536  AST 18 18  ALT 13* 11*  ALKPHOS 117 108  BILITOT 0.6 0.4  PROT 6.8 6.1*  ALBUMIN 2.9* 2.5*   No results for input(s): LIPASE, AMYLASE in the last 168 hours. No results for input(s): AMMONIA in the last 168 hours. Coagulation Profile: Recent Labs  Lab 03/16/18 0013  INR 1.13   Cardiac Enzymes: No results for input(s): CKTOTAL, CKMB, CKMBINDEX, TROPONINI in the last 168 hours. BNP (last 3 results) No results for input(s): PROBNP in the last 8760 hours. HbA1C: No results for input(s): HGBA1C in the last 72 hours. CBG: Recent Labs  Lab 03/16/18 1204 03/16/18 1726 03/16/18 1832 03/16/18 2131 03/17/18 0619  GLUCAP 127* 60* 117* 111* 155*   Lipid Profile: No results for input(s): CHOL, HDL, LDLCALC, TRIG, CHOLHDL, LDLDIRECT in the last 72 hours. Thyroid Function Tests: Recent Labs    03/16/18 0013  TSH 13.697*   Anemia Panel: Recent Labs    03/15/18 1716  VITAMINB12 467  FOLATE 28.0  FERRITIN 10*  TIBC 371  IRON 14*  RETICCTPCT 1.6   Urine analysis:    Component Value Date/Time   COLORURINE YELLOW  05/02/2017 1222   APPEARANCEUR HAZY (A) 05/02/2017 1222   LABSPEC 1.014 05/02/2017 1222   PHURINE 6.0 05/02/2017 1222   GLUCOSEU NEGATIVE 05/02/2017 1222   HGBUR MODERATE (A) 05/02/2017 1222   [place] 05/02/2017 1222   KETONESUR NEGATIVE 05/02/2017 1222   PROTEINUR NEGATIVE 05/02/2017 1222   NITRITE POSITIVE (A) 05/02/2017 1222   LEUKOCYTESUR LARGE (A) 05/02/2017 1222   Sepsis Labs: @LABRCNTIP (procalcitonin:4,lacticidven:4)  ) Recent Results (from the past 240 hour(s))  Culture, blood (routine x 2)     Status: None (Preliminary result)   Collection Time: 03/16/18 12:14 AM  Result Value Ref Range Status   Specimen Description BLOOD LEFT ARM  Final   Special Requests AEROBIC BOTTLE ONLY Blood Culture adequate volume  Final   Culture   Final    NO GROWTH < 12 HOURS Performed at [facility], [place] [address] [place], [place] [address]    Report Status PENDING  Incomplete  Culture, blood (routine x 2)     Status: None (Preliminary result)   Collection Time: 03/16/18 12:19 AM  Result Value  Ref Range Status   Specimen Description BLOOD LEFT ARM  Final   Special Requests AEROBIC BOTTLE ONLY Blood Culture adequate volume  Final   Culture   Final    NO GROWTH < 12 HOURS Performed at [facility], [facility] [address], [place], [place] [address]    Report Status PENDING  Incomplete         Radiology Studies: Dg Tibia/fibula Left  Result Date: 03/15/2018 CLINICAL DATA:  Pain and swelling for 2 days. EXAM: LEFT TIBIA AND FIBULA - 2 VIEW COMPARISON:  None. FINDINGS: Diffuse subcutaneous soft tissue swelling/edema. No gas is seen in the soft tissues. Moderate to advanced degenerative changes involving the knee joint. The ankle joint is maintained. No acute bony findings or destructive bony changes. Vascular calcifications are noted. IMPRESSION: No acute bony findings. Diffuse soft tissue swelling/edema. Electronically Signed   By: Marijo Sanes M.D.   On: 03/15/2018  18:45   Dg Chest Port 1 View  Result Date: 03/15/2018 CLINICAL DATA:  Leg swelling and redness EXAM: PORTABLE CHEST 1 VIEW COMPARISON:  05/02/2017 FINDINGS: Status post left shoulder replacement. Streaky atelectasis at the left base. No focal consolidation. Stable slightly enlarged cardiomediastinal silhouette with aortic atherosclerosis. No pneumothorax. Chronic right upper rib deformity. IMPRESSION: No active disease. Minimal atelectasis at the left base. Mild cardiomegaly. Electronically Signed   By: Donavan Foil M.D.   On: 03/15/2018 22:45        Scheduled Meds: . calcium-vitamin D  1 tablet Oral Q breakfast  . docusate sodium  100 mg Oral Daily  . enoxaparin (LOVENOX) injection  30 mg Subcutaneous Daily  . ferrous sulfate  325 mg Oral Q breakfast  . insulin aspart  0-9 Units Subcutaneous TID WC  . levothyroxine  112 mcg Oral Once per day on Mon Tue Wed Thu Fri Sat  . pantoprazole  20 mg Oral BID   Continuous Infusions: . cefTRIAXone (ROCEPHIN)  IV Stopped (03/17/18 0003)  . vancomycin Stopped (03/16/18 1851)     LOS: 2 days    Time spent: 25 minutes    Dana Allan, MD  [facility] Pager #: [phone] 7PM-7AM contact night coverage as above

## 2018-03-18 LAB — GLUCOSE, CAPILLARY
GLUCOSE-CAPILLARY: 131 mg/dL — AB (ref 65–99)
GLUCOSE-CAPILLARY: 150 mg/dL — AB (ref 65–99)
Glucose-Capillary: 158 mg/dL — ABNORMAL HIGH (ref 65–99)
Glucose-Capillary: 129 mg/dL — ABNORMAL HIGH (ref 65–99)

## 2018-03-18 LAB — CBC
HEMATOCRIT: 28.9 % — AB (ref 36.0–46.0)
Hemoglobin: 8.2 g/dL — ABNORMAL LOW (ref 12.0–15.0)
MCH: 23.4 pg — ABNORMAL LOW (ref 26.0–34.0)
MCHC: 28.4 g/dL — AB (ref 30.0–36.0)
MCV: 82.6 fL (ref 78.0–100.0)
PLATELETS: 285 10*3/uL (ref 150–400)
RBC: 3.5 MIL/uL — ABNORMAL LOW (ref 3.87–5.11)
RDW: 17.2 % — AB (ref 11.5–15.5)
WBC: 14.1 10*3/uL — AB (ref 4.0–10.5)

## 2018-03-18 LAB — BASIC METABOLIC PANEL
Anion gap: 9 (ref 5–15)
BUN: 10 mg/dL (ref 6–20)
CALCIUM: 8.6 mg/dL — AB (ref 8.9–10.3)
CO2: 26 mmol/L (ref 22–32)
CREATININE: 1.12 mg/dL — AB (ref 0.44–1.00)
Chloride: 103 mmol/L (ref 101–111)
GFR, EST AFRICAN AMERICAN: 48 mL/min — AB (ref >60)
GFR, EST NON AFRICAN AMERICAN: 41 mL/min — AB (ref >60)
GLUCOSE: 148 mg/dL — AB (ref 65–99)
Potassium: 4 mmol/L (ref 3.5–5.1)
Sodium: 138 mmol/L (ref 135–145)

## 2018-03-18 NOTE — Progress Notes (Addendum)
PROGRESS NOTE  Casey Watts WNI:[id number] DOB: [birth year] DOA: 03/15/2018 PCP: Haywood Pao, MD  HPI/Recap of past 24 hours: Casey Watts is a 84 y.o. female with medical history significant of HTN, hypothyroidism, anemia, DM type II, and GERD; who presents with complaints of left leg swelling.  Patient is able to provide her own history.  At baseline she lives alone with home health care services and ambulates with use of a cane or walker.  Over the last 2 days she reports having progressive swelling of the left leg.  Subsequently, there is note of redness moving up her leg with increased warmth.  She states that she may intermittently scratch her legs, but does not report any other significant injury or trauma to the onset of symptoms.  Denies any significant pain in her legs, fever, chills, shortness of breath, chest pain, nausea, vomiting, blood in stool/urine, diarrhea, or dysuria.  She reports never having similar symptoms like this in the past.  She makes note that she may have been on antibiotics in the last week, but does not recall why or what medicine she was taking.She was started on empiric antibiotics of vancomycin and ceftriaxone.  [facility] called to admit.  03/18/2018: Patient seen and examined at her bedside.  Left lower extremity cellulitis noted with erythema which is improving.  Last WBC trending up.  Repeat CBC this morning.  Worsening WBC.  Reports a dry cough of 3-day duration.  Chest x-ray done on 03/15/2018 personally reviewed revealed right middle lobe infiltrates versus atelectasis.  Assessment/Plan: Principal Problem:   Cellulitis Active Problems:   Hypothyroidism   Renal insufficiency   Pressure injury of skin   Lactic acidosis   Diabetes mellitus type 2 in nonobese [facility])  Left lower extremity cellulitis, present on admission Currently on IV vancomycin and IV ceftriaxone Demarcation is improving however worsening WBC with leukocytosis WBC 14 from 11 Continue  close monitoring  Intractable cough suspected bronchitis versus pneumonia WBC 14 on 11 Currently on IV vancomycin and IV ceftriaxone Monitor fever curve Get a pro-calcitonin and lactic acid  Acute leukocytosis with no clear etiology Cellulitis appears to be improving Afebrile Obtain pro-calcitonin and lactic acid Repeat CBC in the morning and monitor fever curve  Hypothyroidism TSH 13 from 03/16/2018 Continue levothyroxine  Chronic normocytic anemia/iron deficiency anemia With baseline hemoglobin of 9 Hemoglobin 8.2 Severe low ferritin, iron and saturation Continue ferrous sulfate No sign of overt bleeding Repeat CBC in the morning  Code Status: DNR  Family Communication: Son and daughter-in-law at bedside   Disposition Plan: Home when clinically stable     Consultants: None  Procedures:   None  Antimicrobials:  IV vancomycin and IV ceftriaxone  DVT prophylaxis: SCDs   Objective: Vitals:   03/17/18 0837 03/17/18 1644 03/17/18 2014 03/18/18 0522  BP: (!) 153/62 (!) 149/67 (!) 161/68 (!) 141/58  Pulse: 72 73 81 73  Resp: 18     Temp: 98.8 F (37.1 C) 98.9 F (37.2 C) 98.4 F (36.9 C) 98.4 F (36.9 C)  TempSrc: Oral Oral Oral Oral  SpO2: 93% 92% 92% 92%  Weight:        Intake/Output Summary (Last 24 hours) at 03/18/2018 1313 Last data filed at 03/18/2018 0500 Gross per 24 hour  Intake 220 ml  Output 1850 ml  Net -1630 ml   Filed Weights   03/15/18 2200  Weight: 59.9 kg (132 lb)    Exam:   General: 84 year old Caucasian female well-developed well-nourished  in no acute distress.  Cardiovascular: Regular rate and rhythm with no rubs or gallops.  No JVD or thyromegaly present.  Respiratory: Mild rales at bases.  No wheezes noted.  Abdomen: Soft nontender nondistended with normal bowel sounds x4.  Musculoskeletal: No lower extremity edema  Skin: Stage II sacral decubitus ulcer   Psychiatry: Mood is appropriate for condition and  setting.   Data Reviewed: CBC: Recent Labs  Lab 03/15/18 1253 03/16/18 0414 03/17/18 0536 03/18/18 1143  WBC 9.5 9.9 11.7* 14.1*  NEUTROABS 7.3  --   --   --   HGB 8.5* 8.1* 8.3* 8.2*  HCT 29.6* 27.9* 28.8* 28.9*  MCV 83.9 83.5 82.8 82.6  PLT 324 281 299 956   Basic Metabolic Panel: Recent Labs  Lab 03/15/18 1253 03/16/18 0414 03/17/18 0536 03/18/18 1143  NA 139 140 140 138  K 4.7 4.4 4.1 4.0  CL 102 105 104 103  CO2 27 28 27 26   GLUCOSE 125* 101* 104* 148*  BUN 27* 22* 15 10  CREATININE 1.38* 1.23* 1.22* 1.12*  CALCIUM 9.2 8.7* 8.8* 8.6*   GFR: CrCl cannot be calculated (Unknown ideal weight.). Liver Function Tests: Recent Labs  Lab 03/15/18 1253 03/17/18 0536  AST 18 18  ALT 13* 11*  ALKPHOS 117 108  BILITOT 0.6 0.4  PROT 6.8 6.1*  ALBUMIN 2.9* 2.5*   No results for input(s): LIPASE, AMYLASE in the last 168 hours. No results for input(s): AMMONIA in the last 168 hours. Coagulation Profile: Recent Labs  Lab 03/16/18 0013  INR 1.13   Cardiac Enzymes: No results for input(s): CKTOTAL, CKMB, CKMBINDEX, TROPONINI in the last 168 hours. BNP (last 3 results) No results for input(s): PROBNP in the last 8760 hours. HbA1C: No results for input(s): HGBA1C in the last 72 hours. CBG: Recent Labs  Lab 03/17/18 0619 03/17/18 1150 03/17/18 1639 03/17/18 2017 03/18/18 0653  GLUCAP 155* 177* 171* 201* 131*   Lipid Profile: No results for input(s): CHOL, HDL, LDLCALC, TRIG, CHOLHDL, LDLDIRECT in the last 72 hours. Thyroid Function Tests: Recent Labs    03/16/18 0013  TSH 13.697*   Anemia Panel: Recent Labs    03/15/18 1716  VITAMINB12 467  FOLATE 28.0  FERRITIN 10*  TIBC 371  IRON 14*  RETICCTPCT 1.6   Urine analysis:    Component Value Date/Time   COLORURINE YELLOW 05/02/2017 1222   APPEARANCEUR HAZY (A) 05/02/2017 1222   LABSPEC 1.014 05/02/2017 1222   PHURINE 6.0 05/02/2017 1222   GLUCOSEU NEGATIVE 05/02/2017 1222   HGBUR MODERATE  (A) 05/02/2017 1222   [place] 05/02/2017 1222   KETONESUR NEGATIVE 05/02/2017 1222   PROTEINUR NEGATIVE 05/02/2017 1222   NITRITE POSITIVE (A) 05/02/2017 1222   LEUKOCYTESUR LARGE (A) 05/02/2017 1222   Sepsis Labs: @LABRCNTIP (procalcitonin:4,lacticidven:4)  ) Recent Results (from the past 240 hour(s))  Culture, blood (routine x 2)     Status: None (Preliminary result)   Collection Time: 03/16/18 12:14 AM  Result Value Ref Range Status   Specimen Description BLOOD LEFT ARM  Final   Special Requests AEROBIC BOTTLE ONLY Blood Culture adequate volume  Final   Culture   Final    NO GROWTH 2 DAYS Performed at [facility], [place] [address] [place], [place] [address]    Report Status PENDING  Incomplete  Culture, blood (routine x 2)     Status: None (Preliminary result)   Collection Time: 03/16/18 12:19 AM  Result Value Ref Range Status  Specimen Description BLOOD LEFT ARM  Final   Special Requests AEROBIC BOTTLE ONLY Blood Culture adequate volume  Final   Culture   Final    NO GROWTH 2 DAYS Performed at [facility], [address], [place], [place] [address]    Report Status PENDING  Incomplete      Studies: No results found.  Scheduled Meds: . calcium-vitamin D  1 tablet Oral Q breakfast  . docusate sodium  100 mg Oral Daily  . enoxaparin (LOVENOX) injection  30 mg Subcutaneous Daily  . ferrous sulfate  325 mg Oral Q breakfast  . insulin aspart  0-9 Units Subcutaneous TID WC  . levothyroxine  112 mcg Oral Once per day on Mon Tue Wed Thu Fri Sat  . pantoprazole  20 mg Oral BID    Continuous Infusions: . cefTRIAXone (ROCEPHIN)  IV Stopped (03/17/18 2318)  . vancomycin Stopped (03/17/18 1903)     LOS: 3 days     Kayleen Memos, MD [facility] Pager [phone]  If 7PM-7AM, please contact night-coverage [email or web address] Password [email or web address] 03/18/2018, 1:13 PM

## 2018-03-18 NOTE — Progress Notes (Signed)
Pharmacy Antibiotic Note  Casey Watts is a 84 y.o. female admitted on 03/15/2018 with cellulitis of left lower extremity.  Pharmacy has been consulted for vancomycin dosing. WBC up to 14.1 and patient afebrile. SCr down to 1.12 with CrCl ~ 29 ml/min. Patient is also on ceftriaxone. Today is D#4 antibiotics.      Plan: Continue Vancomycin 500 mg every 24 hours  Continue Ceftriaxone 1 gm every 24 hours  F/U cultures, LOT, renal function, and vanc trough as needed.  Temp (24hrs), Avg:98.6 F (37 C), Min:98.4 F (36.9 C), Max:98.9 F (37.2 C)  Recent Labs  Lab 03/15/18 1253 03/15/18 1306 03/15/18 1755 03/16/18 0013 03/16/18 0414 03/17/18 0536 03/18/18 1143  WBC 9.5  --   --   --  9.9 11.7* 14.1*  CREATININE 1.38*  --   --   --  1.23* 1.22* 1.12*  LATICACIDVEN  --  1.37 2.28* 1.1 0.9  --   --     CrCl cannot be calculated (Unknown ideal weight.).    No Known Allergies  Antimicrobials this admission: Ceftriaxone 4/11 >> Vancomycin 4/11 >>  Dose adjustments this admission: none  Microbiology results: 4/12 BCx: NGTD  Thank you for allowing pharmacy to be a part of this patient's care.  Jimmy Footman, PharmD, BCPS PGY2 Infectious Diseases Pharmacy Resident Pager: [phone]  03/18/2018 2:50 PM

## 2018-03-19 LAB — CBC
HEMATOCRIT: 30.1 % — AB (ref 36.0–46.0)
HEMOGLOBIN: 8.5 g/dL — AB (ref 12.0–15.0)
MCH: 23.9 pg — AB (ref 26.0–34.0)
MCHC: 28.2 g/dL — AB (ref 30.0–36.0)
MCV: 84.6 fL (ref 78.0–100.0)
Platelets: 304 10*3/uL (ref 150–400)
RBC: 3.56 MIL/uL — ABNORMAL LOW (ref 3.87–5.11)
RDW: 17.7 % — AB (ref 11.5–15.5)
WBC: 13.7 10*3/uL — ABNORMAL HIGH (ref 4.0–10.5)

## 2018-03-19 LAB — FOLATE RBC
Folate, Hemolysate: 436.8 ng/mL
Folate, RBC: 1496 ng/mL (ref >498)
Hematocrit: 29.2 % — ABNORMAL LOW (ref 34.0–46.6)

## 2018-03-19 LAB — GLUCOSE, CAPILLARY
Glucose-Capillary: 140 mg/dL — ABNORMAL HIGH (ref 65–99)
Glucose-Capillary: 141 mg/dL — ABNORMAL HIGH (ref 65–99)
Glucose-Capillary: 95 mg/dL (ref 65–99)

## 2018-03-19 LAB — PROCALCITONIN: Procalcitonin: 0.1 ng/mL

## 2018-03-19 LAB — LACTIC ACID, PLASMA: LACTIC ACID, VENOUS: 1 mmol/L (ref 0.5–1.9)

## 2018-03-19 NOTE — Progress Notes (Signed)
PROGRESS NOTE  Casey Watts NID:[id number] DOB: 1924/01/03 DOA: 03/15/2018 PCP: Haywood Pao, MD  HPI/Recap of past 24 hours: Casey Watts is a 84 y.o. female with medical history significant of HTN, hypothyroidism, anemia, DM type II, and GERD; who presents with complaints of left leg swelling.  Patient is able to provide her own history.  At baseline she lives alone with home health care services and ambulates with use of a cane or walker.  Over the last 2 days she reports having progressive swelling of the left leg.  Subsequently, there is note of redness moving up her leg with increased warmth.  She states that she may intermittently scratch her legs, but does not report any other significant injury or trauma to the onset of symptoms.  Denies any significant pain in her legs, fever, chills, shortness of breath, chest pain, nausea, vomiting, blood in stool/urine, diarrhea, or dysuria.  She reports never having similar symptoms like this in the past.  She makes note that she may have been on antibiotics in the last week, but does not recall why or what medicine she was taking.She was started on empiric antibiotics of vancomycin and ceftriaxone.  [facility] called to admit.  03/18/2018: Patient seen and examined at her bedside.  Left lower extremity cellulitis noted with erythema which is improving.  Last WBC trending up.  Repeat CBC this morning.  Worsening WBC.  Reports a dry cough of 3-day duration.  Chest x-ray done on 03/15/2018 personally reviewed revealed right middle lobe infiltrates versus atelectasis.  4019: Patient seen and examined at her bedside.  She has no new complaints.  Afebrile overnight white count is trending down.  Assessment/Plan: Principal Problem:   Cellulitis Active Problems:   Hypothyroidism   Renal insufficiency   Pressure injury of skin   Lactic acidosis   Diabetes mellitus type 2 in nonobese [facility])  Left lower extremity cellulitis, present on  admission Currently on IV vancomycin and IV ceftriaxone Demarcation is improving however worsening WBC with leukocytosis is trending down WBC 13 from 14 Pro-calcitonin 0.1 lactic acid 1.0 Blood cultures x2 no growth to date Continue to closely monitor  Intractable nausea with no vomiting May be multifactorial secondary to infective process with cellulitis versus others Continue IV Zofran as needed  Intractable cough suspected bronchitis  Currently on IV vancomycin and IV ceftriaxone Continue to monitor fever curve  Hypothyroidism TSH 13 from 03/16/2018 Continue levothyroxine  Chronic normocytic anemia/iron deficiency anemia With baseline hemoglobin of 9 Hemoglobin 8.2 Severe low ferritin, iron and saturation Continue ferrous sulfate No sign of overt bleeding Repeat CBC in the morning  Code Status: DNR  Family Communication: Son and daughter-in-law at bedside on 03/18/2018  Disposition Plan: Home when clinically stable     Consultants: None  Procedures:   None  Antimicrobials:  IV vancomycin and IV ceftriaxone  DVT prophylaxis: SCDs, subcu Lovenox daily   Objective: Vitals:   03/19/18 1155 03/19/18 1231 03/19/18 1234 03/19/18 1237  BP:  (!) 134/59 134/66 (!) 102/51  Pulse: 75 81 83 90  Resp:      Temp:      TempSrc:      SpO2: 97% 100% 91% 95%  Weight:        Intake/Output Summary (Last 24 hours) at 03/19/2018 1241 Last data filed at 03/19/2018 0841 Gross per 24 hour  Intake 360 ml  Output 850 ml  Net -490 ml   Filed Weights   03/15/18 2200  Weight: 59.9  kg (132 lb)    Exam: 03/19/18   General: 84 year old Caucasian female well-developed well-nourished no acute distress.  Alert and oriented x3.  Cardiovascular: Regular rate and rhythm with no rubs or gallops.  No JVD or thyromegaly present.    Respiratory: Mild rales at bases.  No wheezes noted.  Abdomen: Soft nontender nondistended with normal bowel sounds x4.  Musculoskeletal: No lower  extremity edema  Skin: Stage II sacral decubitus ulcer   Psychiatry: Mood is appropriate for condition and setting.   Data Reviewed: CBC: Recent Labs  Lab 03/15/18 1253 03/16/18 0414 03/17/18 0536 03/18/18 1143 03/19/18 0653  WBC 9.5 9.9 11.7* 14.1* 13.7*  NEUTROABS 7.3  --   --   --   --   HGB 8.5* 8.1* 8.3* 8.2* 8.5*  HCT 29.6* 27.9* 28.8* 28.9* 30.1*  MCV 83.9 83.5 82.8 82.6 84.6  PLT 324 281 299 285 696   Basic Metabolic Panel: Recent Labs  Lab 03/15/18 1253 03/16/18 0414 03/17/18 0536 03/18/18 1143  NA 139 140 140 138  K 4.7 4.4 4.1 4.0  CL 102 105 104 103  CO2 27 28 27 26   GLUCOSE 125* 101* 104* 148*  BUN 27* 22* 15 10  CREATININE 1.38* 1.23* 1.22* 1.12*  CALCIUM 9.2 8.7* 8.8* 8.6*   GFR: CrCl cannot be calculated (Unknown ideal weight.). Liver Function Tests: Recent Labs  Lab 03/15/18 1253 03/17/18 0536  AST 18 18  ALT 13* 11*  ALKPHOS 117 108  BILITOT 0.6 0.4  PROT 6.8 6.1*  ALBUMIN 2.9* 2.5*   No results for input(s): LIPASE, AMYLASE in the last 168 hours. No results for input(s): AMMONIA in the last 168 hours. Coagulation Profile: Recent Labs  Lab 03/16/18 0013  INR 1.13   Cardiac Enzymes: No results for input(s): CKTOTAL, CKMB, CKMBINDEX, TROPONINI in the last 168 hours. BNP (last 3 results) No results for input(s): PROBNP in the last 8760 hours. HbA1C: No results for input(s): HGBA1C in the last 72 hours. CBG: Recent Labs  Lab 03/18/18 1203 03/18/18 1629 03/18/18 2013 03/19/18 0656 03/19/18 1136  GLUCAP 158* 150* 129* 140* 141*   Lipid Profile: No results for input(s): CHOL, HDL, LDLCALC, TRIG, CHOLHDL, LDLDIRECT in the last 72 hours. Thyroid Function Tests: No results for input(s): TSH, T4TOTAL, FREET4, T3FREE, THYROIDAB in the last 72 hours. Anemia Panel: No results for input(s): VITAMINB12, FOLATE, FERRITIN, TIBC, IRON, RETICCTPCT in the last 72 hours. Urine analysis:    Component Value Date/Time   COLORURINE YELLOW  05/02/2017 1222   APPEARANCEUR HAZY (A) 05/02/2017 1222   LABSPEC 1.014 05/02/2017 1222   PHURINE 6.0 05/02/2017 1222   GLUCOSEU NEGATIVE 05/02/2017 1222   HGBUR MODERATE (A) 05/02/2017 1222   BILIRUBINUR NEGATIVE 05/02/2017 1222   KETONESUR NEGATIVE 05/02/2017 1222   PROTEINUR NEGATIVE 05/02/2017 1222   NITRITE POSITIVE (A) 05/02/2017 1222   LEUKOCYTESUR LARGE (A) 05/02/2017 1222   Sepsis Labs: @LABRCNTIP (procalcitonin:4,lacticidven:4)  ) Recent Results (from the past 240 hour(s))  Culture, blood (routine x 2)     Status: None (Preliminary result)   Collection Time: 03/16/18 12:14 AM  Result Value Ref Range Status   Specimen Description BLOOD LEFT ARM  Final   Special Requests AEROBIC BOTTLE ONLY Blood Culture adequate volume  Final   Culture   Final    NO GROWTH 2 DAYS Performed at [facility], [place] [address] [place], [place] [address]    Report Status PENDING  Incomplete  Culture, blood (routine x 2)  Status: None (Preliminary result)   Collection Time: 03/16/18 12:19 AM  Result Value Ref Range Status   Specimen Description BLOOD LEFT ARM  Final   Special Requests AEROBIC BOTTLE ONLY Blood Culture adequate volume  Final   Culture   Final    NO GROWTH 2 DAYS Performed at [facility], [address] [place], [place] [address]    Report Status PENDING  Incomplete      Studies: No results found.  Scheduled Meds: . calcium-vitamin D  1 tablet Oral Q breakfast  . docusate sodium  100 mg Oral Daily  . enoxaparin (LOVENOX) injection  30 mg Subcutaneous Daily  . ferrous sulfate  325 mg Oral Q breakfast  . insulin aspart  0-9 Units Subcutaneous TID WC  . levothyroxine  112 mcg Oral Once per day on Mon Tue Wed Thu Fri Sat  . pantoprazole  20 mg Oral BID    Continuous Infusions: . cefTRIAXone (ROCEPHIN)  IV Stopped (03/19/18 0011)  . vancomycin Stopped (03/18/18 1718)     LOS: 4 days     Kayleen Memos, MD [facility] Pager  [phone]  If 7PM-7AM, please contact night-coverage [email or web address] Password [facility] 03/19/2018, 12:41 PM

## 2018-03-19 NOTE — Care Management Important Message (Signed)
Important Message  Patient Details  Name: AUBRIANNA ORCHARD MRN: [id number] Date of Birth: July 11, 1924   Medicare Important Message Given:  Yes    Neetu Carrozza 03/19/2018, 1:16 PM

## 2018-03-19 NOTE — Progress Notes (Signed)
While doing rounds patient up and complained of feeling nauseous. No vomiting. Pt given prn zofran and ginger ale.Will continue to monitor.

## 2018-03-19 NOTE — Plan of Care (Signed)
  Problem: Clinical Measurements: Goal: Ability to maintain clinical measurements within normal limits will improve Outcome: Progressing   

## 2018-03-19 NOTE — Evaluation (Signed)
Physical Therapy Evaluation Patient Details Name: Casey Watts MRN: [id number] DOB: 04-04-1924 Today's Date: 03/19/2018   History of Present Illness  Pt is a 84 y.o. female s/p L calf cellulitis. PMH includes DM, HTN, renal insufficiency, and previous pressure injuries/ulcers. Prior to this episode of cellulitis, pt was living independently in a condo with home health services that came in every morning and afternoons/evenings as needed. Pt also reports attentive care from her 6 children who visit and help out regularly.   Clinical Impression  Pt was able to get up and walk a very short distance to the recliner with RW, two person assist for safety.  She is generally weak and deconditioned from several days in the bed.  She would benefit from short term rehab before returning home where she has aide and family help PRN.   PT to follow acutely for deficits listed below.   BP dropped significantly (see orthostatic vitals in computer, however, pt was asymptomatic, O2 sats were also low (<89%) on RA during mobility, so 2 L O2 [place] returned to nose at end of session).    Follow Up Recommendations SNF    Equipment Recommendations  None recommended by PT    Recommendations for Other Services       Precautions / Restrictions Precautions Precautions: Other (comment)(Orthostatic hypotension) Precaution Comments: Pt BP at rest in bed 563/87; systolic BP dropped to 564 when pt was sitting EOB(BP 134/59 at rest in bed)      Mobility  Bed Mobility Overal bed mobility: Modified Independent(Use of bed rails to get supine to EOB)                Transfers Overall transfer level: Needs assistance Equipment used: Rolling walker (2 wheeled) Transfers: Sit to/from [facility] Sit to Stand: Mod assist;+2 physical assistance;+2 safety/equipment;From elevated surface Stand pivot transfers: Mod assist;+2 physical assistance;+2 safety/equipment       General transfer comment: Mod A  +2 for sit to stand from elevated bed height; pt required extra time and multiple attempts to achieve standing. Pt also Mod A +1 at gait belt for stand pivot transfer but needs Mod A +2 for helping pt manage walker and verbal cueing to walk inside RW. Pt plopped into chair without warning once she felt it behind her legs.  Ambulation/Gait Ambulation/Gait assistance: Mod assist;+2 physical assistance;+2 safety/equipment Ambulation Distance (Feet): 5 Feet Assistive device: Rolling walker (2 wheeled) Gait Pattern/deviations: Step-through pattern;Shuffle;Decreased stride length;Decreased step length - left;Decreased step length - right;Leaning posteriorly;Trunk flexed;Narrow base of support(Observed during stand pivot transfer of increased length)     General Gait Details: Pt has shuffling gait pattern and has difficulty keeping up with RW. Pt has tendency to lean posteriorly during transfers but when upright retains flexed trunk, kyphotic posture.   Stairs            Wheelchair Mobility    Modified Rankin (Stroke Patients Only)       Balance Overall balance assessment: Needs assistance Sitting-balance support: Bilateral upper extremity supported;Feet supported Sitting balance-Leahy Scale: Good Sitting balance - Comments: Tendency to lean posteriorly Postural control: Posterior lean Standing balance support: Bilateral upper extremity supported Standing balance-Leahy Scale: Poor Standing balance comment: Pt required Mod A for standing balance and maneuvering RW simultaneously                             Pertinent Vitals/Pain Pain Assessment: 0-10 Pain Score: 1  Pain  Location: Bilat knees Pain Descriptors / Indicators: Discomfort Pain Intervention(s): Limited activity within patient's tolerance;Monitored during session    Home Living Family/patient expects to be discharged to:: Private residence Living Arrangements: Alone Available Help at Discharge: Family;Home  health;Available PRN/intermittently(Home health every morning and afternoon/evenings as needed) Type of Home: Apartment(Condo) Home Access: Stairs to enter(Per pt, "2 little steps") Entrance Stairs-Rails: None Entrance Stairs-Number of Steps: 2 Home Layout: One level Home Equipment: Shower seat;Grab bars - toilet;Grab bars - tub/shower;Walker - 4 wheels([place] in law reports grab bars and life alert necklace) Additional Comments: Family able to provide 24 hr supervision until pt is back to being herself    Prior Function Level of Independence: Independent with assistive device(s);Needs assistance(Rollator)      ADL's / Homemaking Assistance Needed: Home health provides assistance as needed for daily household tasks         Hand Dominance        Extremity/Trunk Assessment   Upper Extremity Assessment Upper Extremity Assessment: Generalized weakness(not specifically tested, gross)    Lower Extremity Assessment Lower Extremity Assessment: RLE deficits/detail;LLE deficits/detail;Generalized weakness RLE Deficits / Details: Pt reports pain in R knee; generalized weakness likely due to immobility LLE Deficits / Details: Generalized weakness likely secondary to immobility    Cervical / Trunk Assessment Cervical / Trunk Assessment: Kyphotic  Communication   Communication: No difficulties  Cognition Arousal/Alertness: Awake/alert Behavior During Therapy: WFL for tasks assessed/performed Overall Cognitive Status: Within Functional Limits for tasks assessed                                        General Comments      Exercises     Assessment/Plan    PT Assessment Patient needs continued PT services  PT Problem List Decreased strength;Decreased activity tolerance;Decreased balance;Decreased mobility;Decreased knowledge of use of DME;Pain;Decreased skin integrity;Cardiopulmonary status limiting activity       PT Treatment Interventions DME  instruction;Gait training;Functional mobility training;Therapeutic activities;Therapeutic exercise;Balance training;Patient/family education    PT Goals (Current goals can be found in the Care Plan section)  Acute Rehab PT Goals Patient Stated Goal: to go home PT Goal Formulation: With patient/family Time For Goal Achievement: 04/02/18 Potential to Achieve Goals: Good    Frequency Min 2X/week   Barriers to discharge        Co-evaluation               AM-PAC PT "6 Clicks" Daily Activity  Outcome Measure Difficulty turning over in bed (including adjusting bedclothes, sheets and blankets)?: Unable Difficulty moving from lying on back to sitting on the side of the bed? : Unable Difficulty sitting down on and standing up from a chair with arms (e.g., wheelchair, bedside commode, etc,.)?: Unable Help needed moving to and from a bed to chair (including a wheelchair)?: A Lot Help needed walking in hospital room?: A Lot Help needed climbing 3-5 steps with a railing? : Total 6 Click Score: 8    End of Session Equipment Utilized During Treatment: Gait belt;Oxygen(2 L [place]) Activity Tolerance: Patient tolerated treatment well;Patient limited by fatigue(Pt's spo2% and BP dropped significantly with activity) Patient left: in chair;with call bell/phone within reach;with chair alarm set;with family/visitor present   PT Visit Diagnosis: Unsteadiness on feet (R26.81);Other abnormalities of gait and mobility (R26.89);Muscle weakness (generalized) (M62.81);Pain;Difficulty in walking, not elsewhere classified (R26.2) Pain - Right/Left: Right Pain - part of body: Knee(R knee and  L leg bothering her, but R knee more than L leg)    Time: 5790-3833 PT Time Calculation (min) (ACUTE ONLY): 31 min   Charges:           Wells Guiles B. Keliah Harned, PT, DPT [phone]   PT Evaluation $PT Eval Moderate Complexity: 1 Mod PT Treatments $Therapeutic Activity: 8-22 mins   03/19/2018, 2:08 PM

## 2018-03-20 ENCOUNTER — Inpatient Hospital Stay (HOSPITAL_COMMUNITY): Payer: PPO

## 2018-03-20 LAB — CBC
HCT: 30.2 % — ABNORMAL LOW (ref 36.0–46.0)
HEMOGLOBIN: 8.4 g/dL — AB (ref 12.0–15.0)
MCH: 24 pg — AB (ref 26.0–34.0)
MCHC: 27.8 g/dL — ABNORMAL LOW (ref 30.0–36.0)
MCV: 86.3 fL (ref 78.0–100.0)
PLATELETS: 329 10*3/uL (ref 150–400)
RBC: 3.5 MIL/uL — AB (ref 3.87–5.11)
RDW: 18 % — ABNORMAL HIGH (ref 11.5–15.5)
WBC: 12.7 10*3/uL — AB (ref 4.0–10.5)

## 2018-03-20 LAB — GLUCOSE, CAPILLARY
Glucose-Capillary: 119 mg/dL — ABNORMAL HIGH (ref 65–99)
Glucose-Capillary: 111 mg/dL — ABNORMAL HIGH (ref 65–99)
Glucose-Capillary: 109 mg/dL — ABNORMAL HIGH (ref 65–99)

## 2018-03-20 MED ORDER — IRBESARTAN-HYDROCHLOROTHIAZIDE 150-12.5 MG PO TABS: 1.0000 | ORAL_TABLET | Freq: Every day | ORAL | Status: DC

## 2018-03-20 MED ORDER — FUROSEMIDE 10 MG/ML IJ SOLN: 40.0000 mg | Freq: Once | INTRAMUSCULAR | Status: DC

## 2018-03-20 MED ORDER — ALBUTEROL SULFATE (2.5 MG/3ML) 0.083% IN NEBU: 2.5000 mg | INHALATION_SOLUTION | RESPIRATORY_TRACT | Status: DC | PRN

## 2018-03-20 MED ORDER — IPRATROPIUM-ALBUTEROL 0.5-2.5 (3) MG/3ML IN SOLN
3.0000 mL | Freq: Four times a day (QID) | RESPIRATORY_TRACT | Status: DC
  Administered 2018-03-20: 3 mL via RESPIRATORY_TRACT
  Filled 2018-03-20: qty 3

## 2018-03-20 MED ORDER — CEPHALEXIN 250 MG PO CAPS
250.0000 mg | ORAL_CAPSULE | Freq: Three times a day (TID) | ORAL | Status: DC
  Administered 2018-03-20 – 2018-03-21 (×2): 250 mg via ORAL
  Filled 2018-03-20 (×5): qty 1

## 2018-03-20 MED ORDER — FUROSEMIDE 10 MG/ML IJ SOLN
40.0000 mg | Freq: Every day | INTRAMUSCULAR | Status: DC
  Administered 2018-03-20 – 2018-03-22 (×3): 40 mg via INTRAVENOUS
  Filled 2018-03-20 (×3): qty 4

## 2018-03-20 MED ORDER — LEVOTHYROXINE SODIUM 112 MCG PO TABS: 56.0000 ug | ORAL_TABLET | ORAL | Status: DC

## 2018-03-20 NOTE — Progress Notes (Signed)
Physical Therapy Treatment Patient Details Name: Casey Watts MRN: [id number] DOB: 1924-04-24 Today's Date: 03/20/2018    History of Present Illness Pt is a 84 y.o. female s/p L calf cellulitis. PMH includes DM, HTN, renal insufficiency, and previous pressure injuries/ulcers. Prior to this episode of cellulitis, pt was living independently in a condo with home health services that came in every morning and afternoons/evenings as needed. Pt also reports attentive care from her 6 children who visit and help out regularly.     PT Comments    Pt was incontinent of stool in the bed and had to be cleaned prior to getting up OOB.  She continues to drop in her O2 sats on RA (84%) and does better on 2 L O2 [place] 94%.  She has soft, but stable BPs and does not complain of any lightheadedness when up and moving.  She remains appropriate for SNF level rehab at discharge.    Follow Up Recommendations  SNF     Equipment Recommendations  None recommended by PT    Recommendations for Other Services   NA     Precautions / Restrictions Precautions Precautions: Other (comment);Fall Precaution Comments: monitor BPs and O2 (did not use O2 at home PTA) Restrictions Weight Bearing Restrictions: No    Mobility  Bed Mobility Overal bed mobility: Needs Assistance Bed Mobility: Supine to Sit;Rolling Rolling: Min assist   Supine to sit: Min assist     General bed mobility comments: min assist at end of transition to sit up as she has a bit of a posterior and right lateral lean preference until her feet are touching the ground.  Bed also preferences pt to lean right due to incline at the bottom. Min assist to roll bil multiple times for pericare.  She did not know she had a BM.   Transfers Overall transfer level: Needs assistance Equipment used: Rolling walker (2 wheeled) Transfers: Sit to/from Stand Sit to Stand: Mod assist;From elevated surface;+2 safety/equipment         General transfer  comment: Mod assist to stand from elevated bed to support trunk to power up.   Ambulation/Gait Ambulation/Gait assistance: +2 safety/equipment;Min assist Ambulation Distance (Feet): 8 Feet Assistive device: Rolling walker (2 wheeled) Gait Pattern/deviations: Step-through pattern;Shuffle;Trunk flexed     General Gait Details: Pt with very flexed trunk cues to stay in side of RW for safety. second person for line management and safety.         Balance Overall balance assessment: Needs assistance Sitting-balance support: Feet supported;Bilateral upper extremity supported Sitting balance-Leahy Scale: Poor Sitting balance - Comments: min assist to maintain midline sitting posture EOB.  Postural control: Posterior lean;Right lateral lean Standing balance support: Bilateral upper extremity supported Standing balance-Leahy Scale: Poor Standing balance comment: Mod assist with RW in standing.                             Cognition Arousal/Alertness: Awake/alert Behavior During Therapy: WFL for tasks assessed/performed Overall Cognitive Status: Impaired/Different from baseline Area of Impairment: Memory                     Memory: Decreased short-term memory         General Comments: Pt does not recall working with [place] yesterday, she does not recall what kind of RW she uses at home.              Pertinent Vitals/Pain Pain Assessment:  Faces Faces Pain Scale: Hurts little more Pain Location: right knee after short distance gait Pain Descriptors / Indicators: Grimacing;Guarding Pain Intervention(s): Limited activity within patient's tolerance;Monitored during session;Repositioned       Prior Function            PT Goals (current goals can now be found in the care plan section) Acute Rehab PT Goals Patient Stated Goal: to go home Progress towards PT goals: Progressing toward goals    Frequency    Min 2X/week      PT Plan Current plan remains  appropriate       AM-PAC PT "6 Clicks" Daily Activity  Outcome Measure  Difficulty turning over in bed (including adjusting bedclothes, sheets and blankets)?: Unable Difficulty moving from lying on back to sitting on the side of the bed? : Unable Difficulty sitting down on and standing up from a chair with arms (e.g., wheelchair, bedside commode, etc,.)?: Unable Help needed moving to and from a bed to chair (including a wheelchair)?: A Lot Help needed walking in hospital room?: A Lot Help needed climbing 3-5 steps with a railing? : Total 6 Click Score: 8    End of Session Equipment Utilized During Treatment: Gait belt;Oxygen Activity Tolerance: Patient limited by fatigue;Patient limited by pain Patient left: in chair;with call bell/phone within reach;with chair alarm set Nurse Communication: Mobility status PT Visit Diagnosis: Unsteadiness on feet (R26.81);Other abnormalities of gait and mobility (R26.89);Muscle weakness (generalized) (M62.81);Pain;Difficulty in walking, not elsewhere classified (R26.2) Pain - Right/Left: Right Pain - part of body: Knee     Time: 1623-1710 PT Time Calculation (min) (ACUTE ONLY): 47 min  Charges:  $Therapeutic Activity: 38-52 mins          Steven Veazie B. Kamiah, [place], DPT [phone]            03/20/2018, 5:17 PM

## 2018-03-20 NOTE — Progress Notes (Signed)
PROGRESS NOTE  Casey Watts OZD:[id number] DOB: 12-Aug-1924 DOA: 03/15/2018 PCP: Haywood Pao, MD  HPI/Recap of past 24 hours: Casey Watts is a 84 y.o. female with medical history significant of HTN, hypothyroidism, anemia, DM type II, and GERD; who presents with complaints of left leg swelling.  Patient is able to provide her own history.  At baseline she lives alone with home health care services and ambulates with use of a cane or walker.  Over the last 2 days she reports having progressive swelling of the left leg.  Subsequently, there is note of redness moving up her leg with increased warmth.  She states that she may intermittently scratch her legs, but does not report any other significant injury or trauma to the onset of symptoms.  Denies any significant pain in her legs, fever, chills, shortness of breath, chest pain, nausea, vomiting, blood in stool/urine, diarrhea, or dysuria.  She reports never having similar symptoms like this in the past.  She makes note that she may have been on antibiotics in the last week, but does not recall why or what medicine she was taking.She was started on empiric antibiotics of vancomycin and ceftriaxone.  [facility] called to admit.  03/18/2018: Patient seen and examined at her bedside.  Left lower extremity cellulitis noted with erythema which is improving.  Last WBC trending up.  Repeat CBC this morning.  Worsening WBC.  Reports a dry cough of 3-day duration.  Chest x-ray done on 03/15/2018 personally reviewed revealed right middle lobe infiltrates versus atelectasis.  03/19/18: Patient seen and examined at her bedside.  She has no new complaints.  Afebrile overnight white count is trending down.  03/20/18: Patient seen and examined at her bedside.  She has no new complaints.  Oxygen saturation decreased to the low 90s.  Chest x-ray ordered and personally reviewed revealed increase in pulmonary vascularity bilaterally.  Started on IV Lasix 40 mg daily.   Holding off on home med losartan and HCTZ due to soft blood pressures and renal insufficiency.  PT recommend SNF.  Assessment/Plan: Principal Problem:   Cellulitis Active Problems:   Hypothyroidism   Renal insufficiency   Pressure injury of skin   Lactic acidosis   Diabetes mellitus type 2 in nonobese [facility])  Left lower extremity cellulitis, present on admission-improving Completed 5 days of  IV vancomycin and IV ceftriaxone Started on Keflex today 03/20/2018 Pro-calcitonin 0.1 lactic acid 1.0 Blood cultures x2 no growth to date  Acute hypoxic respiratory failure secondary to mild pulmonary edema Chest x-ray done on 03/20/2018 and personally reviewed revealed increase in pulmonary vascularity Started on IV Lasix 40 mg daily Continue close monitoring of blood pressure Strict I's and O's and daily weight  Physical debility/ambulatory dysfunction PT recommend SNF Fall precautions Social worker consulted for SNF placement Transfer to SNF when bed available  Intractable nausea with no vomiting, resolved May be multifactorial secondary to infective process with cellulitis versus others Continue IV Zofran as needed  Intractable cough most likely secondary to mild pulmonary edema Continue to monitor Management as stated above  Hypothyroidism TSH 13 from 03/16/2018 Continue levothyroxine  Chronic normocytic anemia/iron deficiency anemia With baseline hemoglobin of 9 Hemoglobin 8.2 Severe low ferritin, iron and saturation Continue ferrous sulfate No sign of overt bleeding Repeat CBC in the morning  Code Status: DNR  Family Communication: Son and daughter-in-law at bedside on 03/18/2018.  Called son on 03/20/2018 and left a message.  Disposition Plan: SNF when clinically stable  Consultants: None  Procedures:   None  Antimicrobials:  P.o. Keflex  DVT prophylaxis: SCDs, subcu Lovenox daily   Objective: Vitals:   03/19/18 1237 03/19/18 2004 03/20/18 0420  03/20/18 1213  BP: (!) 102/51 (!) 121/52 (!) 104/45 (!) 117/45  Pulse: 90 72 71 60  Resp:    12  Temp:  98.2 F (36.8 C) 98.3 F (36.8 C) (!) 97.5 F (36.4 C)  TempSrc:  Oral Oral Oral  SpO2: 95% (!) 77% (!) 83% 94%  Weight:        Intake/Output Summary (Last 24 hours) at 03/20/2018 1439 Last data filed at 03/20/2018 1300 Gross per 24 hour  Intake 600 ml  Output 400 ml  Net 200 ml   Filed Weights   03/15/18 2200  Weight: 59.9 kg (132 lb)    Exam: 03/20/2018  General: 84 year old Caucasian female well-developed well-nourished no acute distress.  Alert and oriented x3.  Cardiovascular: Regular rate and rhythm with no rubs or gallops.  No JVD or thyromegaly present.  Respiratory: Mild rales at bases.  No wheezes noted.  Abdomen: Soft nontender nondistended with normal bowel sounds x4.  Musculoskeletal: No lower extremity edema  Skin: Stage II sacral decubitus ulcer   Psychiatry: Mood is appropriate for condition and setting.   Data Reviewed: CBC: Recent Labs  Lab 03/15/18 1253 03/16/18 0414 03/17/18 0536 03/17/18 2019 03/18/18 1143 03/19/18 0653 03/20/18 0451  WBC 9.5 9.9 11.7*  --  14.1* 13.7* 12.7*  NEUTROABS 7.3  --   --   --   --   --   --   HGB 8.5* 8.1* 8.3*  --  8.2* 8.5* 8.4*  HCT 29.6* 27.9* 28.8* 29.2* 28.9* 30.1* 30.2*  MCV 83.9 83.5 82.8  --  82.6 84.6 86.3  PLT 324 281 299  --  285 304 193   Basic Metabolic Panel: Recent Labs  Lab 03/15/18 1253 03/16/18 0414 03/17/18 0536 03/18/18 1143  NA 139 140 140 138  K 4.7 4.4 4.1 4.0  CL 102 105 104 103  CO2 27 28 27 26   GLUCOSE 125* 101* 104* 148*  BUN 27* 22* 15 10  CREATININE 1.38* 1.23* 1.22* 1.12*  CALCIUM 9.2 8.7* 8.8* 8.6*   GFR: CrCl cannot be calculated (Unknown ideal weight.). Liver Function Tests: Recent Labs  Lab 03/15/18 1253 03/17/18 0536  AST 18 18  ALT 13* 11*  ALKPHOS 117 108  BILITOT 0.6 0.4  PROT 6.8 6.1*  ALBUMIN 2.9* 2.5*   No results for input(s): LIPASE,  AMYLASE in the last 168 hours. No results for input(s): AMMONIA in the last 168 hours. Coagulation Profile: Recent Labs  Lab 03/16/18 0013  INR 1.13   Cardiac Enzymes: No results for input(s): CKTOTAL, CKMB, CKMBINDEX, TROPONINI in the last 168 hours. BNP (last 3 results) No results for input(s): PROBNP in the last 8760 hours. HbA1C: No results for input(s): HGBA1C in the last 72 hours. CBG: Recent Labs  Lab 03/19/18 0656 03/19/18 1136 03/19/18 2124 03/20/18 0629 03/20/18 1143  GLUCAP 140* 141* 95 119* 111*   Lipid Profile: No results for input(s): CHOL, HDL, LDLCALC, TRIG, CHOLHDL, LDLDIRECT in the last 72 hours. Thyroid Function Tests: No results for input(s): TSH, T4TOTAL, FREET4, T3FREE, THYROIDAB in the last 72 hours. Anemia Panel: No results for input(s): VITAMINB12, FOLATE, FERRITIN, TIBC, IRON, RETICCTPCT in the last 72 hours. Urine analysis:    Component Value Date/Time   COLORURINE YELLOW 05/02/2017 1222   APPEARANCEUR HAZY (A) 05/02/2017 1222  LABSPEC 1.014 05/02/2017 1222   PHURINE 6.0 05/02/2017 1222   GLUCOSEU NEGATIVE 05/02/2017 1222   HGBUR MODERATE (A) 05/02/2017 1222   [place] 05/02/2017 1222   KETONESUR NEGATIVE 05/02/2017 1222   PROTEINUR NEGATIVE 05/02/2017 1222   NITRITE POSITIVE (A) 05/02/2017 1222   LEUKOCYTESUR LARGE (A) 05/02/2017 1222   Sepsis Labs: @LABRCNTIP (procalcitonin:4,lacticidven:4)  ) Recent Results (from the past 240 hour(s))  Culture, blood (routine x 2)     Status: None (Preliminary result)   Collection Time: 03/16/18 12:14 AM  Result Value Ref Range Status   Specimen Description BLOOD LEFT ARM  Final   Special Requests AEROBIC BOTTLE ONLY Blood Culture adequate volume  Final   Culture   Final    NO GROWTH 4 DAYS Performed at [facility], [place] [address], [place], [place] [address]    Report Status PENDING  Incomplete  Culture, blood (routine x 2)     Status: None (Preliminary result)   Collection  Time: 03/16/18 12:19 AM  Result Value Ref Range Status   Specimen Description BLOOD LEFT ARM  Final   Special Requests AEROBIC BOTTLE ONLY Blood Culture adequate volume  Final   Culture   Final    NO GROWTH 4 DAYS Performed at [facility], [place] [address], [place], [place] [address]    Report Status PENDING  Incomplete      Studies: Dg Chest Port 1 View  Result Date: 03/20/2018 CLINICAL DATA:  Shortness of breath. EXAM: PORTABLE CHEST 1 VIEW COMPARISON:  Radiograph of March 15, 2018. FINDINGS: Stable cardiomediastinal silhouette. No pneumothorax is noted. Atherosclerosis of thoracic aorta is noted. Status post left shoulder arthroplasty. Mild bibasilar subsegmental atelectasis is noted with minimal pleural effusions. IMPRESSION: Mild bibasilar subsegmental atelectasis with minimal pleural effusions. Aortic Atherosclerosis (ICD10-I70.0). Electronically Signed   By: Marijo Conception, M.D.   On: 03/20/2018 08:27    Scheduled Meds: . calcium-vitamin D  1 tablet Oral Q breakfast  . cephALEXin  250 mg Oral Q8H  . docusate sodium  100 mg Oral Daily  . enoxaparin (LOVENOX) injection  30 mg Subcutaneous Daily  . ferrous sulfate  325 mg Oral Q breakfast  . furosemide  40 mg Intravenous Daily  . insulin aspart  0-9 Units Subcutaneous TID WC  . levothyroxine  112 mcg Oral Once per day on Mon Tue Wed Thu Fri Sat  . [START ON 03/25/2018] levothyroxine  56 mcg Oral Once per day on Sun  . pantoprazole  20 mg Oral BID    Continuous Infusions:    LOS: 5 days     Kayleen Memos, MD [facility] Pager [phone]  If 7PM-7AM, please contact night-coverage [email or web address] Password [email or web address] 03/20/2018, 2:39 PM

## 2018-03-21 ENCOUNTER — Other Ambulatory Visit: Payer: Self-pay

## 2018-03-21 ENCOUNTER — Encounter (HOSPITAL_COMMUNITY): Payer: Self-pay | Admitting: General Practice

## 2018-03-21 DIAGNOSIS — L03119 Cellulitis of unspecified part of limb: Secondary | ICD-10-CM

## 2018-03-21 DIAGNOSIS — E039 Hypothyroidism, unspecified: Secondary | ICD-10-CM

## 2018-03-21 DIAGNOSIS — E119 Type 2 diabetes mellitus without complications: Secondary | ICD-10-CM

## 2018-03-21 DIAGNOSIS — L02419 Cutaneous abscess of limb, unspecified: Secondary | ICD-10-CM

## 2018-03-21 DIAGNOSIS — M7989 Other specified soft tissue disorders: Secondary | ICD-10-CM

## 2018-03-21 DIAGNOSIS — L03116 Cellulitis of left lower limb: Principal | ICD-10-CM

## 2018-03-21 HISTORY — DX: Cellulitis of unspecified part of limb: L03.119

## 2018-03-21 HISTORY — DX: Cellulitis of unspecified part of limb: L02.419

## 2018-03-21 LAB — GLUCOSE, CAPILLARY
GLUCOSE-CAPILLARY: 148 mg/dL — AB (ref 65–99)
GLUCOSE-CAPILLARY: 117 mg/dL — AB (ref 65–99)
Glucose-Capillary: 139 mg/dL — ABNORMAL HIGH (ref 65–99)
Glucose-Capillary: 108 mg/dL — ABNORMAL HIGH (ref 65–99)
Glucose-Capillary: 215 mg/dL — ABNORMAL HIGH (ref 65–99)

## 2018-03-21 LAB — CULTURE, BLOOD (ROUTINE X 2)
CULTURE: NO GROWTH
Culture: NO GROWTH
SPECIAL REQUESTS: ADEQUATE
Special Requests: ADEQUATE

## 2018-03-21 LAB — CBC
HCT: 29.5 % — ABNORMAL LOW (ref 36.0–46.0)
Hemoglobin: 8.3 g/dL — ABNORMAL LOW (ref 12.0–15.0)
MCH: 23.9 pg — ABNORMAL LOW (ref 26.0–34.0)
MCHC: 28.1 g/dL — ABNORMAL LOW (ref 30.0–36.0)
MCV: 84.8 fL (ref 78.0–100.0)
Platelets: 312 10*3/uL (ref 150–400)
RBC: 3.48 MIL/uL — AB (ref 3.87–5.11)
RDW: 17.9 % — AB (ref 11.5–15.5)
WBC: 12.4 10*3/uL — AB (ref 4.0–10.5)

## 2018-03-21 LAB — METHYLMALONIC ACID, SERUM: Methylmalonic Acid, Quantitative: 246 nmol/L (ref 0–378)

## 2018-03-21 LAB — MRSA PCR SCREENING: MRSA by PCR: NEGATIVE

## 2018-03-21 MED ORDER — CEPHALEXIN 250 MG PO CAPS
250.0000 mg | ORAL_CAPSULE | Freq: Three times a day (TID) | ORAL | Status: DC
  Administered 2018-03-21 – 2018-03-22 (×5): 250 mg via ORAL
  Filled 2018-03-21 (×5): qty 1

## 2018-03-21 NOTE — NC FL2 (Signed)
[place] LEVEL OF CARE SCREENING TOOL     IDENTIFICATION  Patient Name: Casey Watts Birthdate: [birth year] Sex: female Admission Date (Current Location): 03/15/2018  [facility] and [place] Number:  Herbalist and Address:  [facility], [place] [address], [place], [place] [address]      Provider Number: [id number]  Attending Physician Name and Address:  Bonnell Public, MD  Relative Name and Phone Number:  Penelopi Mikrut, son, [phone]    Current Level of Care: Hospital Recommended Level of Care: [place] Prior Approval Number:    Date Approved/Denied:   PASRR Number: [id number]  Discharge Plan: SNF    Current Diagnoses: Patient Active Problem List   Diagnosis Date Noted  . Pressure injury of skin 03/16/2018  . Lactic acidosis 03/16/2018  . Diabetes mellitus type 2 in nonobese ([place]) 03/16/2018  . Cellulitis 03/15/2018  . Chest pain 05/02/2017  . Diabetes mellitus with complication ([place]) 32/35/5732  . Renal insufficiency 05/02/2017  . Leukocytosis 05/02/2017  . Chronic pain 05/02/2017  . Atypical chest pain   . GERD (gastroesophageal reflux disease) 02/13/2014  . Constipation 02/13/2014  . Hypothyroidism 02/13/2014  . Hypertension 02/13/2014  . Proximal humeral fracture 02/04/2014  . Stricture and stenosis of esophagus 02/28/2011  . Chronic duodenal ulcer without mention of hemorrhage, perforation, or obstruction 02/28/2011  . DM 01/19/2011    Orientation RESPIRATION BLADDER Height & Weight     Self, Time, Situation, Place  Normal Continent Weight: 132 lb (59.9 kg) Height:     BEHAVIORAL SYMPTOMS/MOOD NEUROLOGICAL BOWEL NUTRITION STATUS      Continent Diet(See DC Summary)  AMBULATORY STATUS COMMUNICATION OF NEEDS Skin   Extensive Assist Verbally PU Stage and Appropriate Care(Foam PRN)                       Personal Care Assistance Level of Assistance  Dressing, Bathing, Feeding  Bathing Assistance: Limited assistance Feeding assistance: Limited assistance Dressing Assistance: Limited assistance     Functional Limitations Info  Sight, Hearing, Speech Sight Info: Adequate Hearing Info: Adequate Speech Info: Adequate    SPECIAL CARE FACTORS FREQUENCY  PT (By licensed PT), OT (By licensed OT)     PT Frequency: 5x week OT Frequency: 5x week            Contractures      Additional Factors Info  Code Status, Allergies, Insulin Sliding Scale Code Status Info: DNR Allergies Info: No Known Allergies   Insulin Sliding Scale Info: Insulin daily       Current Medications (03/21/2018):  This is the current hospital active medication list Current Facility-Administered Medications  Medication Dose Route Frequency Provider Last Rate Last Dose  . acetaminophen (TYLENOL) tablet 650 mg  650 mg Oral Q6H PRN Fuller Plan A, MD   650 mg at 03/18/18 2038   Or  . acetaminophen (TYLENOL) suppository 650 mg  650 mg Rectal Q6H PRN Smith, Rondell A, MD      . albuterol (PROVENTIL) (2.5 MG/3ML) 0.083% nebulizer solution 2.5 mg  2.5 mg Nebulization Q2H PRN Hall, Carole N, DO      . calcium-vitamin D (OSCAL WITH D) 500-200 MG-UNIT per tablet 1 tablet  1 tablet Oral Q breakfast Fuller Plan A, MD   1 tablet at 03/21/18 0840  . cephALEXin (KEFLEX) capsule 250 mg  250 mg Oral Q8H Hall, Carole N, DO   250 mg at 03/21/18 2025  . docusate  sodium (COLACE) capsule 100 mg  100 mg Oral BID PRN Fuller Plan A, MD   100 mg at 03/16/18 0845  . docusate sodium (COLACE) capsule 100 mg  100 mg Oral Daily Jani Gravel, MD   100 mg at 03/21/18 0840  . enoxaparin (LOVENOX) injection 30 mg  30 mg Subcutaneous Daily Jani Gravel, MD   30 mg at 03/21/18 0841  . ferrous sulfate tablet 325 mg  325 mg Oral Q breakfast Jani Gravel, MD   325 mg at 03/21/18 0841  . furosemide (LASIX) injection 40 mg  40 mg Intravenous Daily Irene Pap N, DO   40 mg at 03/21/18 0840  . HYDROcodone-acetaminophen  (NORCO/VICODIN) 5-325 MG per tablet 1 tablet  1 tablet Oral Q4H PRN Smith, Rondell A, MD      . insulin aspart (novoLOG) injection 0-9 Units  0-9 Units Subcutaneous TID WC Bonnell Public, MD   Stopped at 03/19/18 1737  . levothyroxine (SYNTHROID, LEVOTHROID) tablet 112 mcg  112 mcg Oral Once per day on Mon Tue Wed Thu Fri Sat Fuller Plan A, MD   112 mcg at 03/21/18 7616  . [START ON 03/25/2018] levothyroxine (SYNTHROID, LEVOTHROID) tablet 56 mcg  56 mcg Oral Once per day on Sun Hall, Carole N, DO      . ondansetron [facility]) tablet 4 mg  4 mg Oral Q6H PRN Fuller Plan A, MD   4 mg at 03/21/18 0840   Or  . ondansetron (ZOFRAN) injection 4 mg  4 mg Intravenous Q6H PRN Fuller Plan A, MD      . pantoprazole (PROTONIX) EC tablet 20 mg  20 mg Oral BID Fuller Plan A, MD   20 mg at 03/21/18 0840  . sodium chloride (OCEAN) 0.65 % nasal spray 1 spray  1 spray Each Nare QHS PRN Norval Morton, MD         Discharge Medications: Please see discharge summary for a list of discharge medications.  Relevant Imaging Results:  Relevant Lab Results:   Additional Information SS#: [id number]  Normajean Baxter, LCSW

## 2018-03-21 NOTE — Social Work (Signed)
CSW contacted daughter to discuss SNF as [facility] does not have a bed. Daughter accepted SNF bed from [facility]. CSW contacted SNF to confirm bed offer.   CSW then called [employer] to start [place]. CSW left message for clinical nurse to follow up.  CSW will continue to follow for disposition.  Elissa Hefty, LCSW Clinical Social Worker [phone]

## 2018-03-21 NOTE — Progress Notes (Signed)
PROGRESS NOTE  Casey Watts IRW:[id number] DOB: March 14, 1924 DOA: 03/15/2018 PCP: Haywood Pao, MD  HPI/Recap of past 24 hours: Casey Watts is a 84 y.o. female with medical history significant of HTN, hypothyroidism, anemia, DM type II, and GERD; who presents with complaints of left leg swelling.  Patient is able to provide her own history.  At baseline she lives alone with home health care services and ambulates with use of a cane or walker.    Patient developed progressive swelling and cellulitis of the left leg 2 days prior to admission.    Eventually, the patient also developed mild cellulitis of the right lower extremity.  With antibiotics, the cellulitis has improved significantly.  Patient is awaiting disposition.  Assessment/Plan: Principal Problem:   Cellulitis Active Problems:   Hypothyroidism   Renal insufficiency   Pressure injury of skin   Lactic acidosis   Diabetes mellitus type 2 in nonobese [facility])  Bilateral cellulitis of the lower extremities, worse on the left leg: Completed 5 days of  IV vancomycin and IV ceftriaxone Started on Keflex today 03/20/2018 Pro-calcitonin 0.1 lactic acid 1.0 Blood cultures x2 no growth to date Pursue disposition.  Acute hypoxic respiratory failure secondary to mild pulmonary edema: Chest x-ray done on 03/20/2018 and personally reviewed revealed increase in pulmonary vascularity Started on IV Lasix 40 mg daily Continue close monitoring of blood pressure Strict I's and O's and daily weight  Physical debility/ambulatory dysfunction PT recommend SNF Fall precautions Social worker consulted for SNF placement Transfer to SNF when bed available  Intractable nausea with no vomiting, resolved May be multifactorial secondary to infective process with cellulitis versus others Continue IV Zofran as needed  Intractable cough most likely secondary to mild pulmonary edema Continue to monitor Management as stated  above  Hypothyroidism TSH 13 from 03/16/2018 Continue levothyroxine  Chronic normocytic anemia/iron deficiency anemia With baseline hemoglobin of 9 Hemoglobin 8.2 Severe low ferritin, iron and saturation Continue ferrous sulfate No sign of overt bleeding Repeat CBC in the morning  Code Status: DNR  Family Communication:  Disposition Plan: SNF when patient's insurance comes through.   Consultants: None  Procedures:   None  Antimicrobials:  P.o. Keflex  DVT prophylaxis: SCDs, subcu Lovenox daily   Objective: Vitals:   03/20/18 1624 03/20/18 1628 03/20/18 2223 03/21/18 0648  BP:   (!) 126/51 (!) 133/54  Pulse:   66 70  Resp:   16 16  Temp:   98.4 F (36.9 C) 98.6 F (37 C)  TempSrc:   Oral Oral  SpO2: (!) 84% (!) 82% 96% 92%  Weight:        Intake/Output Summary (Last 24 hours) at 03/21/2018 1200 Last data filed at 03/21/2018 0647 Gross per 24 hour  Intake 360 ml  Output 1100 ml  Net -740 ml   Filed Weights   03/15/18 2200  Weight: 59.9 kg (132 lb)    Exam: 03/20/2018  General: 84 year old Caucasian female well-developed well-nourished no acute distress.  Alert and oriented x3.  Cardiovascular: Regular rate and rhythm with no rubs or gallops.  No JVD or thyromegaly present.  Respiratory: Mild rales at bases.  No wheezes noted.  Abdomen: Soft nontender nondistended with normal bowel sounds x4.  Musculoskeletal: No lower extremity edema  Skin: Stage II sacral decubitus ulcer   Psychiatry: Mood is appropriate for condition and setting.   Data Reviewed: CBC: Recent Labs  Lab 03/15/18 1253  03/17/18 0536 03/17/18 2019 03/18/18 1143 03/19/18 7619 03/20/18 0451  03/21/18 0538  WBC 9.5   < > 11.7*  --  14.1* 13.7* 12.7* 12.4*  NEUTROABS 7.3  --   --   --   --   --   --   --   HGB 8.5*   < > 8.3*  --  8.2* 8.5* 8.4* 8.3*  HCT 29.6*   < > 28.8* 29.2* 28.9* 30.1* 30.2* 29.5*  MCV 83.9   < > 82.8  --  82.6 84.6 86.3 84.8  PLT 324   < > 299  --   285 304 329 312   < > = values in this interval not displayed.   Basic Metabolic Panel: Recent Labs  Lab 03/15/18 1253 03/16/18 0414 03/17/18 0536 03/18/18 1143  NA 139 140 140 138  K 4.7 4.4 4.1 4.0  CL 102 105 104 103  CO2 27 28 27 26   GLUCOSE 125* 101* 104* 148*  BUN 27* 22* 15 10  CREATININE 1.38* 1.23* 1.22* 1.12*  CALCIUM 9.2 8.7* 8.8* 8.6*   GFR: CrCl cannot be calculated (Unknown ideal weight.). Liver Function Tests: Recent Labs  Lab 03/15/18 1253 03/17/18 0536  AST 18 18  ALT 13* 11*  ALKPHOS 117 108  BILITOT 0.6 0.4  PROT 6.8 6.1*  ALBUMIN 2.9* 2.5*   No results for input(s): LIPASE, AMYLASE in the last 168 hours. No results for input(s): AMMONIA in the last 168 hours. Coagulation Profile: Recent Labs  Lab 03/16/18 0013  INR 1.13   Cardiac Enzymes: No results for input(s): CKTOTAL, CKMB, CKMBINDEX, TROPONINI in the last 168 hours. BNP (last 3 results) No results for input(s): PROBNP in the last 8760 hours. HbA1C: No results for input(s): HGBA1C in the last 72 hours. CBG: Recent Labs  Lab 03/20/18 1143 03/20/18 1538 03/20/18 2148 03/21/18 0642 03/21/18 1138  GLUCAP 111* 139* 109* 108* 148*   Lipid Profile: No results for input(s): CHOL, HDL, LDLCALC, TRIG, CHOLHDL, LDLDIRECT in the last 72 hours. Thyroid Function Tests: No results for input(s): TSH, T4TOTAL, FREET4, T3FREE, THYROIDAB in the last 72 hours. Anemia Panel: No results for input(s): VITAMINB12, FOLATE, FERRITIN, TIBC, IRON, RETICCTPCT in the last 72 hours. Urine analysis:    Component Value Date/Time   COLORURINE YELLOW 05/02/2017 1222   APPEARANCEUR HAZY (A) 05/02/2017 1222   LABSPEC 1.014 05/02/2017 1222   PHURINE 6.0 05/02/2017 1222   GLUCOSEU NEGATIVE 05/02/2017 1222   HGBUR MODERATE (A) 05/02/2017 1222   BILIRUBINUR NEGATIVE 05/02/2017 1222   KETONESUR NEGATIVE 05/02/2017 1222   PROTEINUR NEGATIVE 05/02/2017 1222   NITRITE POSITIVE (A) 05/02/2017 1222   LEUKOCYTESUR  LARGE (A) 05/02/2017 1222   Sepsis Labs: @LABRCNTIP (procalcitonin:4,lacticidven:4)  ) Recent Results (from the past 240 hour(s))  Culture, blood (routine x 2)     Status: None (Preliminary result)   Collection Time: 03/16/18 12:14 AM  Result Value Ref Range Status   Specimen Description BLOOD LEFT ARM  Final   Special Requests AEROBIC BOTTLE ONLY Blood Culture adequate volume  Final   Culture   Final    NO GROWTH 4 DAYS Performed at [facility], [place] [address] [place], [place] [address]    Report Status PENDING  Incomplete  Culture, blood (routine x 2)     Status: None (Preliminary result)   Collection Time: 03/16/18 12:19 AM  Result Value Ref Range Status   Specimen Description BLOOD LEFT ARM  Final   Special Requests AEROBIC BOTTLE ONLY Blood Culture adequate volume  Final   Culture  Final    NO GROWTH 4 DAYS Performed at [facility], [place] [address], [place], [place] [address]    Report Status PENDING  Incomplete  MRSA PCR Screening     Status: None   Collection Time: 03/21/18  6:21 AM  Result Value Ref Range Status   MRSA by PCR NEGATIVE NEGATIVE Final    Comment:        The GeneXpert MRSA Assay (FDA approved for NASAL specimens only), is one component of a comprehensive MRSA colonization surveillance program. It is not intended to diagnose MRSA infection nor to guide or monitor treatment for MRSA infections. Performed at [facility], [place] [address] [place], [place] [address]       Studies: No results found.  Scheduled Meds: . calcium-vitamin D  1 tablet Oral Q breakfast  . cephALEXin  250 mg Oral Q8H  . docusate sodium  100 mg Oral Daily  . enoxaparin (LOVENOX) injection  30 mg Subcutaneous Daily  . ferrous sulfate  325 mg Oral Q breakfast  . furosemide  40 mg Intravenous Daily  . insulin aspart  0-9 Units Subcutaneous TID WC  . levothyroxine  112 mcg Oral Once per day on Mon Tue Wed Thu Fri Sat  . [START ON 03/25/2018]  levothyroxine  56 mcg Oral Once per day on Sun  . pantoprazole  20 mg Oral BID    Continuous Infusions:    LOS: 6 days     Bonnell Public, MD [facility] Pager [phone]  If 7PM-7AM, please contact night-coverage [email or web address] Password [email or web address] 03/21/2018, 12:00 PM

## 2018-03-21 NOTE — Clinical Social Work Note (Signed)
Clinical Social Work Assessment  Patient Details  Name: Casey Watts MRN: [id number] Date of Birth: 06-Jul-1924  Date of referral:  03/21/18               Reason for consult:  Facility Placement                Permission sought to share information with:  Case Manager Permission granted to share information::  Yes, Verbal Permission Granted  Name::     Dance movement psychotherapist::  SNF  Relationship::  daughter  Contact Information:     Housing/Transportation Living arrangements for the past 2 months:  Single Family Home Source of Information:  Patient, Adult Children Patient Interpreter Needed:  None Criminal Activity/Legal Involvement Pertinent to Current Situation/Hospitalization:  No - Comment as needed Significant Relationships:  Adult Children, Other Family Members Lives with:  Self Do you feel safe going back to the place where you live?  No Need for family participation in patient care:  Yes (Comment)  Care giving concerns:  Pt from hole alone and will need rehabilitative therapies at discharge.  Social Worker assessment / plan:  CSW met with patient at bedside to discuss SNF. Pt confirmed that that she gets help from home healt in the morning and her children in the evening. She can bath herself and feed herself independently. She is agreeable to SNF and referred CSW to daughter Zigmund Daniel to follow up on SNF placement and make the decision.  CSW explained role and SNF process.  CSW then called daughter Zigmund Daniel to discuss SNF process. CSW will f/u with daughter once SNF offers received.  CSW will f/u for disposition.  Employment status:  Retired Nurse, adult PT Recommendations:  [place] / Referral to community resources:  [place]  Patient/Family's Response to care:  Patient appreciative of [place] meeting to discuss the discharge plan as patient agreeable to SNF. No issues or concerns identified.  Patient/Family's  Understanding of and Emotional Response to Diagnosis, Current Treatment, and Prognosis:  Patient has good understanding of impairment and agreeable to SNF. Patient family is involved in care and per patient, daughter/son are investigating SNF's. CSW will f/u on same. Pt hopeful to improve and return home after rehabilitative therapies. CSW will f/u for disposition.  Emotional Assessment Appearance:  Appears stated age Attitude/Demeanor/Rapport:  (Cooperative) Affect (typically observed):  Accepting, Appropriate Orientation:  Oriented to Situation, Oriented to  Time, Oriented to Place, Oriented to Self Alcohol / Substance use:  Not Applicable Psych involvement (Current and /or in the community):  No (Comment)  Discharge Needs  Concerns to be addressed:  Discharge Planning Concerns Readmission within the last 30 days:  No Current discharge risk:  Dependent with Mobility, Physical Impairment, Lives alone Barriers to Discharge:  No Barriers Identified   Normajean Baxter, LCSW 03/21/2018, 12:09 PM

## 2018-03-22 DIAGNOSIS — E039 Hypothyroidism, unspecified: Secondary | ICD-10-CM | POA: Diagnosis not present

## 2018-03-22 DIAGNOSIS — M6281 Muscle weakness (generalized): Secondary | ICD-10-CM | POA: Diagnosis not present

## 2018-03-22 DIAGNOSIS — D649 Anemia, unspecified: Secondary | ICD-10-CM | POA: Diagnosis not present

## 2018-03-22 DIAGNOSIS — K210 Gastro-esophageal reflux disease with esophagitis: Secondary | ICD-10-CM | POA: Diagnosis not present

## 2018-03-22 DIAGNOSIS — K219 Gastro-esophageal reflux disease without esophagitis: Secondary | ICD-10-CM | POA: Diagnosis not present

## 2018-03-22 DIAGNOSIS — R2681 Unsteadiness on feet: Secondary | ICD-10-CM | POA: Diagnosis not present

## 2018-03-22 DIAGNOSIS — R488 Other symbolic dysfunctions: Secondary | ICD-10-CM | POA: Diagnosis not present

## 2018-03-22 DIAGNOSIS — I10 Essential (primary) hypertension: Secondary | ICD-10-CM | POA: Diagnosis not present

## 2018-03-22 DIAGNOSIS — J9601 Acute respiratory failure with hypoxia: Secondary | ICD-10-CM | POA: Diagnosis not present

## 2018-03-22 DIAGNOSIS — N289 Disorder of kidney and ureter, unspecified: Secondary | ICD-10-CM | POA: Diagnosis not present

## 2018-03-22 DIAGNOSIS — M7989 Other specified soft tissue disorders: Secondary | ICD-10-CM | POA: Diagnosis not present

## 2018-03-22 DIAGNOSIS — J45909 Unspecified asthma, uncomplicated: Secondary | ICD-10-CM | POA: Diagnosis not present

## 2018-03-22 DIAGNOSIS — E119 Type 2 diabetes mellitus without complications: Secondary | ICD-10-CM | POA: Diagnosis not present

## 2018-03-22 DIAGNOSIS — D508 Other iron deficiency anemias: Secondary | ICD-10-CM | POA: Diagnosis not present

## 2018-03-22 DIAGNOSIS — E034 Atrophy of thyroid (acquired): Secondary | ICD-10-CM | POA: Diagnosis not present

## 2018-03-22 DIAGNOSIS — L03116 Cellulitis of left lower limb: Secondary | ICD-10-CM | POA: Diagnosis not present

## 2018-03-22 DIAGNOSIS — L03115 Cellulitis of right lower limb: Secondary | ICD-10-CM | POA: Diagnosis not present

## 2018-03-22 LAB — GLUCOSE, CAPILLARY
GLUCOSE-CAPILLARY: 161 mg/dL — AB (ref 65–99)
Glucose-Capillary: 149 mg/dL — ABNORMAL HIGH (ref 65–99)

## 2018-03-22 MED ORDER — FERROUS SULFATE 325 (65 FE) MG PO TABS: 325.0000 mg | ORAL_TABLET | Freq: Every day | ORAL | 0 refills | Status: DC

## 2018-03-22 MED ORDER — CEPHALEXIN 250 MG PO CAPS: 250.0000 mg | ORAL_CAPSULE | Freq: Three times a day (TID) | ORAL | 0 refills | Status: DC

## 2018-03-22 NOTE — Plan of Care (Signed)
  Problem: Health Behavior/Discharge Planning: Goal: Ability to manage health-related needs will improve Outcome: Progressing   Problem: Clinical Measurements: Goal: Ability to maintain clinical measurements within normal limits will improve Outcome: Progressing Goal: Will remain free from infection Outcome: Progressing Goal: Diagnostic test results will improve Outcome: Progressing Goal: Respiratory complications will improve Outcome: Progressing Goal: Cardiovascular complication will be avoided Outcome: Progressing   Problem: Activity: Goal: Risk for activity intolerance will decrease Outcome: Progressing   Problem: Nutrition: Goal: Adequate nutrition will be maintained Outcome: Progressing   Problem: Coping: Goal: Level of anxiety will decrease Outcome: Progressing   Problem: Elimination: Goal: Will not experience complications related to bowel motility Outcome: Progressing Goal: Will not experience complications related to urinary retention Outcome: Progressing   Problem: Pain Managment: Goal: General experience of comfort will improve Outcome: Progressing   Problem: Safety: Goal: Ability to remain free from injury will improve Outcome: Progressing   Problem: Skin Integrity: Goal: Risk for impaired skin integrity will decrease Outcome: Progressing   Problem: Clinical Measurements: Goal: Ability to avoid or minimize complications of infection will improve Outcome: Progressing   Problem: Skin Integrity: Goal: Skin integrity will improve Outcome: Progressing   

## 2018-03-22 NOTE — Progress Notes (Signed)
Physical Therapy Treatment Patient Details Name: Casey Watts MRN: [id number] DOB: 1924-10-10 Today's Date: 03/22/2018    History of Present Illness Pt is a 84 y.o. female s/p L calf cellulitis. PMH includes DM, HTN, renal insufficiency, and previous pressure injuries/ulcers. Prior to this episode of cellulitis, pt was living independently in a condo with home health services that came in every morning and afternoons/evenings as needed. Pt also reports attentive care from her 6 children who visit and help out regularly.     PT Comments    Pt received in bed and agreeable to participation in therapy. Pt with c/o pain bilat feet and R knee with initiation of mobility. SPT transfer bed to [facility] followed by ambulation 3 feet with RW BSC to recliner. Mobility limited by pain and fatigue. Pt positioned in recliner with feet elevated at end of session.    Follow Up Recommendations  SNF     Equipment Recommendations  None recommended by PT    Recommendations for Other Services       Precautions / Restrictions Precautions Precautions: Other (comment);Fall Precaution Comments: monitor BPs and O2 (did not use O2 at home PTA)    Mobility  Bed Mobility   Bed Mobility: Supine to Sit     Supine to sit: Min assist     General bed mobility comments: +rail, verbal cues for sequencing, increased time and effort  Transfers Overall transfer level: Needs assistance Equipment used: Rolling walker (2 wheeled) Transfers: Sit to/from [facility] Sit to Stand: Mod assist Stand pivot transfers: Mod assist       General transfer comment: verbal cues for hand placement and sequencing. Assist to power up and stabilize balance in stance. Pivot transfer with RW bed to [facility].   Ambulation/Gait Ambulation/Gait assistance: Mod assist Ambulation Distance (Feet): 3 Feet Assistive device: Rolling walker (2 wheeled) Gait Pattern/deviations: Step-through pattern;Shuffle;Trunk  flexed;Decreased stride length Gait velocity: decreased Gait velocity interpretation: <1.8 ft/sec, indicate of risk for recurrent falls General Gait Details: Ambulation 3 feet BSC to recliner. Assist for balance and RW management. Verbal cues for sequencing. Distance limited by bilat foot pain and decreased activity tolerance.    Stairs             Wheelchair Mobility    Modified Rankin (Stroke Patients Only)       Balance   Sitting-balance support: Feet supported;Bilateral upper extremity supported Sitting balance-Leahy Scale: Fair     Standing balance support: Bilateral upper extremity supported Standing balance-Leahy Scale: Poor Standing balance comment: Mod assist with RW in standing.                             Cognition Arousal/Alertness: Awake/alert Behavior During Therapy: WFL for tasks assessed/performed Overall Cognitive Status: Impaired/Different from baseline Area of Impairment: Memory;Problem solving;Safety/judgement                     Memory: Decreased short-term memory   Safety/Judgement: Decreased awareness of safety   Problem Solving: Difficulty sequencing;Requires verbal cues        Exercises      General Comments        Pertinent Vitals/Pain Pain Assessment: Faces Faces Pain Scale: Hurts even more Pain Location: R knee and bilat feet Pain Descriptors / Indicators: Grimacing;Guarding;Moaning;Sore Pain Intervention(s): Limited activity within patient's tolerance;Repositioned;Monitored during session    Home Living  Prior Function            PT Goals (current goals can now be found in the care plan section) Acute Rehab PT Goals Patient Stated Goal: to go home PT Goal Formulation: With patient/family Time For Goal Achievement: 04/02/18 Potential to Achieve Goals: Good Progress towards PT goals: Progressing toward goals    Frequency    Min 2X/week      PT Plan Current  plan remains appropriate    Co-evaluation              AM-PAC PT "6 Clicks" Daily Activity  Outcome Measure  Difficulty turning over in bed (including adjusting bedclothes, sheets and blankets)?: Unable Difficulty moving from lying on back to sitting on the side of the bed? : Unable Difficulty sitting down on and standing up from a chair with arms (e.g., wheelchair, bedside commode, etc,.)?: Unable Help needed moving to and from a bed to chair (including a wheelchair)?: A Lot Help needed walking in hospital room?: A Lot Help needed climbing 3-5 steps with a railing? : Total 6 Click Score: 8    End of Session Equipment Utilized During Treatment: Gait belt Activity Tolerance: Patient limited by pain;Patient limited by fatigue Patient left: in chair;with chair alarm set;with call bell/phone within reach Nurse Communication: Mobility status PT Visit Diagnosis: Unsteadiness on feet (R26.81);Other abnormalities of gait and mobility (R26.89);Muscle weakness (generalized) (M62.81);Pain;Difficulty in walking, not elsewhere classified (R26.2) Pain - Right/Left: Right Pain - part of body: Knee     Time: 1046-1101 PT Time Calculation (min) (ACUTE ONLY): 15 min  Charges:  $Therapeutic Activity: 8-22 mins                    G Codes:       Lorrin Goodell, PT  Office # [phone] Pager [phone]    Lorriane Shire 03/22/2018, 11:14 AM

## 2018-03-22 NOTE — Progress Notes (Signed)
Attempted to give report to [facility] prior to patient being transported.  After speaking to several people at facility was transferred to a voice message recorder where message was left to return call for report.

## 2018-03-22 NOTE — Clinical Social Work Placement (Signed)
   CLINICAL SOCIAL WORK PLACEMENT  NOTE  Date:  03/22/2018  Patient Details  Name: TASIA LIZ MRN: [id number] Date of Birth: 05-18-1924  Clinical Social Work is seeking post-discharge placement for this patient at the [place] level of care (*CSW will initial, date and re-position this form in  chart as items are completed):  Yes   Patient/family provided with [facility] Work Department's list of facilities offering this level of care within the geographic area requested by the patient (or if unable, by the patient's family).  Yes   Patient/family informed of their freedom to choose among providers that offer the needed level of care, that participate in Medicare, Medicaid or managed care program needed by the patient, have an available bed and are willing to accept the patient.  Yes   Patient/family informed of [place] [facility]'s ownership interest in [facility] and [facility], as well as of the fact that they are under no obligation to receive care at these facilities.  PASRR submitted to EDS on       PASRR number received on       Existing PASRR number confirmed on 03/21/18     FL2 transmitted to all facilities in geographic area requested by pt/family on       FL2 transmitted to all facilities within larger geographic area on 03/21/18     Patient informed that his/her managed care company has contracts with or will negotiate with certain facilities, including the following:        Yes   Patient/family informed of bed offers received.  Patient chooses bed at [facility]     Physician recommends and patient chooses bed at      Patient to be transferred to [facility] on 03/22/18.  Patient to be transferred to facility by [facility]     Patient family notified on 03/22/18 of transfer.  Name of family member notified:  CSW contacted daughter Zigmund Daniel     PHYSICIAN       Additional Comment:     _______________________________________________ Normajean Baxter, LCSW 03/22/2018, 2:58 PM

## 2018-03-22 NOTE — Progress Notes (Signed)
Pt is afebrile this am, MD paged to notify needs discharge order and summary etc.  Awaiting response.  [id number]

## 2018-03-22 NOTE — Social Work (Signed)
CSW called [facility] and they are working on [place] and will contact CSW once Auth received.  CSW will continue to follow for disposition.  Elissa Hefty, LCSW Clinical Social Worker [phone]

## 2018-03-22 NOTE — Social Work (Signed)
Clinical Social Worker facilitated patient discharge including contacting patient family and facility to confirm patient discharge plans.  Clinical information faxed to facility and family agreeable with plan.    CSW arranged ambulance transport via [facility] to [place].    RN to call [phone] to give report prior to discharge.  Clinical Social Worker will sign off for now as social work intervention is no longer needed. Please consult us again if new need arises.  Nova Schmuhl, LCSW Clinical Social Worker [phone]    

## 2018-03-22 NOTE — Discharge Summary (Signed)
Physician Discharge Summary  Patient ID: Casey Watts MRN: [id number] DOB/AGE: [birth year] 84 y.o.  Admit date: 03/15/2018 Discharge date: 03/22/2018  Admission Diagnoses:  Discharge Diagnoses:  Principal Problem:   Cellulitis Active Problems:   Hypothyroidism   Renal insufficiency   Pressure injury of skin   Lactic acidosis   Diabetes mellitus type 2 in nonobese [facility])   Discharged Condition: stable  Hospital Course: Patient is a 84 year old Caucasian female with past medical history significant for hypertension, hypothyroidism, anemia, DM type II,andGERD.  Patient was admitted with bilateral lower extremity cellulitis, worse on the left side. At baseline she lives alone with home health care services and ambulates with use of a cane or walker.   Patient developed progressive swelling and cellulitis of the leg 2 days prior to admission.    Patient was admitted and treated with antibiotics.  Patient was initially treated with IV vancomycin and Rocephin.  Patient will be discharged on oral cephalexin.  Cellulitis has resolved.  Patient is medically stable for discharge.    Bilateral cellulitis of the lower extremities, worse on the left leg: Completed 5 days of  IV vancomycin and IV ceftriaxone Started on Keflex today 03/20/2018 Pro-calcitonin 0.1  lactic acid 1.0 Blood cultures x 2 have not grown any organisms to date.  Acute hypoxic respiratory failure secondary to mild pulmonary edema: This has resolved.  There were concerns for possible pulmonary edema.  Continue to monitor closely.  Physical debility/ambulatory dysfunction: Patient was seen by the physical therapy team.  Skilled nursing facility placement has been recommended. Fall precautions  Hypothyroidism Continue levothyroxine  Chronic normocytic anemia/iron deficiency anemia With baseline hemoglobin of 9 Hemoglobin 8.2 Severe low ferritin, iron and saturation Continue ferrous sulfate No sign of overt  bleeding  Consults: None  Significant Diagnostic Studies:   Discharge Exam: Blood pressure (!) 106/50, pulse 81, temperature 98.3 F (36.8 C), temperature source Oral, resp. rate 16, weight 59.9 kg (132 lb), SpO2 100 %. 0  Disposition: Discharge disposition: 03-Skilled Nursing Facility   Discharge Instructions    Call MD for:   Complete by:  As directed    Please call MD if symptoms worsen   Diet - low sodium heart healthy   Complete by:  As directed    Diet Carb Modified   Complete by:  As directed    Increase activity slowly   Complete by:  As directed      Allergies as of 03/22/2018   No Known Allergies     Medication List    STOP taking these medications   DELSYM NIGHT TIME COUGH/COLD 30-12.04-999 MG/30ML Liqd Generic drug:  DM-Doxylamine-Acetaminophen   diclofenac 75 MG EC tablet Commonly known as:  VOLTAREN   irbesartan-hydrochlorothiazide 150-12.5 MG tablet Commonly known as:  AVALIDE   TYLENOL 8 HOUR ARTHRITIS PAIN PO     TAKE these medications   calcium-vitamin D 500-200 MG-UNIT tablet Commonly known as:  OSCAL WITH D Take 1 tablet by mouth daily with breakfast.   cephALEXin 250 MG capsule Commonly known as:  KEFLEX Take 1 capsule (250 mg total) by mouth every 8 (eight) hours.   cholecalciferol 1000 units tablet Commonly known as:  VITAMIN D Take 1,000 Units by mouth daily.   docusate sodium 100 MG capsule Commonly known as:  COLACE Take 1 capsule (100 mg total) by mouth 2 (two) times daily as needed for mild constipation.   ferrous sulfate 325 (65 FE) MG tablet Take 1 tablet (325 mg total) by mouth  daily with breakfast. Start taking on:  03/23/2018   glimepiride 2 MG tablet Commonly known as:  AMARYL Take 2 mg by mouth daily with breakfast.   HYDROcodone-acetaminophen 5-325 MG tablet Commonly known as:  NORCO/VICODIN Take 1 tablet by mouth every 4 (four) hours as needed for moderate pain. Take one tablet by mouth every 4 hours as needed  for pain   lansoprazole 15 MG capsule Commonly known as:  PREVACID Take 1 capsule (15 mg total) by mouth 2 (two) times daily before a meal.   levothyroxine 112 MCG tablet Commonly known as:  SYNTHROID, LEVOTHROID Take 112 mcg by mouth daily. 1/2 tab on Sunday   SIMPLY SALINE NA Place 1 spray into both nostrils at bedtime as needed (nasal congestion).   vitamin C 500 MG tablet Commonly known as:  ASCORBIC ACID Take 500 mg by mouth daily.       Contact information for follow-up providers    Tisovec, Fransico Him, MD.   Specialty:  Internal Medicine Contact information: [place] [address] [phone]            Contact information for after-discharge care    Destination    [facility] .   Service:  Skilled Nursing Contact information: [address] [place] [phone]                  Signed: Bonnell Public 03/22/2018, 2:32 PM

## 2018-03-26 ENCOUNTER — Encounter: Payer: Self-pay | Admitting: Internal Medicine

## 2018-03-26 ENCOUNTER — Non-Acute Institutional Stay (SKILLED_NURSING_FACILITY): Payer: PPO | Admitting: Internal Medicine

## 2018-03-26 DIAGNOSIS — E119 Type 2 diabetes mellitus without complications: Secondary | ICD-10-CM

## 2018-03-26 DIAGNOSIS — J9601 Acute respiratory failure with hypoxia: Secondary | ICD-10-CM

## 2018-03-26 DIAGNOSIS — K2100 Gastro-esophageal reflux disease with esophagitis, without bleeding: Secondary | ICD-10-CM

## 2018-03-26 DIAGNOSIS — E034 Atrophy of thyroid (acquired): Secondary | ICD-10-CM

## 2018-03-26 DIAGNOSIS — L03116 Cellulitis of left lower limb: Secondary | ICD-10-CM | POA: Diagnosis not present

## 2018-03-26 DIAGNOSIS — D649 Anemia, unspecified: Secondary | ICD-10-CM

## 2018-03-26 DIAGNOSIS — D508 Other iron deficiency anemias: Secondary | ICD-10-CM | POA: Diagnosis not present

## 2018-03-26 NOTE — Progress Notes (Signed)
: Provider: Noah Delaine. Sheppard Coil, MD Location:  [facility] Room Number: [phone] Place of Service:  SNF ([phone])  PCP: Tisovec, Fransico Him, MD Patient Care Team: Tisovec, Fransico Him, MD as PCP - General (Internal Medicine)  Extended Emergency Contact Information Primary Emergency Contact: Alto [place] Mobile Phone: [phone] Relation: Son Secondary Emergency Contact: [place] Mobile Phone: [phone] Relation: Daughter     Allergies: Patient has no known allergies.  Chief Complaint  Patient presents with  . New Admit To SNF    Admit to Facility    HPI: Patient is 84 y.o. female with hypertension, hypothyroidism, anemia, diabetes mellitus type 2, and GERD who presented to the emergency department with complaints of left leg swelling over the past 2 days which has been progressive.  Patient has also noted redness moving up her leg with increased warmth.  Patient denied any significant trauma.  Patient denied any pain in her legs, fever, chills, shortness of breath, chest pain, nausea, vomiting, blood in stool or urine, urine diarrhea, or dysuria.  Patient has never had a symptom like this in the past.  In the emergency department all vital signs were stable patient had no white count elevation.  Venous ultrasound showed no sign of DVT.  Patient was admitted to [facility] from 4/11-18 where she was treated for cellulitis of bilateral lower extremities worse on left.  Patient was treated initially with vancomycin then Rocephin, then Keflex.  Hospital course was complicated by acute hypoxic respiratory failure secondary to mild pulmonary edema which resolved.  Patient is admitted to skilled nursing facility for OT/PT.  Will at skilled nursing facility patient will be followed for hypothyroidism treated with replacement, chronic normocytic anemia treated with iron and diabetes mellitus treated with  glimepiide.  Past Medical History:  Diagnosis Date  . Anemia   . Arthritis   . Asthma    as a child  . Cancer ([place])    skin cancer on left leg  . Cataract   . Cellulitis and abscess of lower extremity 03/21/2018  . Diabetes mellitus    TYPE 2  . Esophageal stricture   . GERD (gastroesophageal reflux disease)   . Glaucoma   . Hypertension   . Hypothyroidism   . Thyroid disease     Past Surgical History:  Procedure Laterality Date  . APPENDECTOMY    . CATARACT EXTRACTION Left   . EYE SURGERY    . FOOT SURGERY Bilateral    Bunionectomy  . REVERSE SHOULDER ARTHROPLASTY Left 02/04/2014   DR MURPHY  . REVERSE SHOULDER ARTHROPLASTY Left 02/04/2014   Procedure: REVERSE SHOULDER ARTHROPLASTY;  Surgeon: Renette Butters, MD;  Location: [place];  Service: Orthopedics;  Laterality: Left;    Allergies as of 03/26/2018   No Known Allergies     Medication List        Accurate as of 03/26/18 11:39 AM. Always use your most recent med list.          calcium-vitamin D 500-200 MG-UNIT tablet Commonly known as:  OSCAL WITH D Take 1 tablet by mouth daily with breakfast.   cephALEXin 250 MG capsule Commonly known as:  KEFLEX Take 1 capsule (250 mg total) by mouth every 8 (eight) hours.   cholecalciferol 1000 units tablet Commonly known as:  VITAMIN D Take 1,000 Units by mouth daily.   docusate sodium 100 MG capsule Commonly known as:  COLACE Take 1  capsule (100 mg total) by mouth 2 (two) times daily as needed for mild constipation.   ferrous sulfate 325 (65 FE) MG tablet Take 1 tablet (325 mg total) by mouth daily with breakfast.   glimepiride 2 MG tablet Commonly known as:  AMARYL Take 2 mg by mouth daily with breakfast.   HYDROcodone-acetaminophen 5-325 MG tablet Commonly known as:  NORCO/VICODIN Take 1 tablet by mouth every 4 (four) hours as needed for moderate pain. Take one tablet by mouth every 4 hours as needed for pain   lansoprazole 15 MG capsule Commonly known  as:  PREVACID Take 1 capsule (15 mg total) by mouth 2 (two) times daily before a meal.   levothyroxine 112 MCG tablet Commonly known as:  SYNTHROID, LEVOTHROID Take 112 mcg by mouth daily. 1/2 tab on Sunday   SIMPLY SALINE NA Place 1 spray into both nostrils at bedtime as needed (nasal congestion).   vitamin C 500 MG tablet Commonly known as:  ASCORBIC ACID Take 500 mg by mouth daily.       No orders of the defined types were placed in this encounter.    There is no immunization history on file for this patient.  Social History   Tobacco Use  . Smoking status: Never Smoker  . Smokeless tobacco: Never Used  Substance Use Topics  . Alcohol use: Yes    Comment: occasional    Family history is   Family History  Problem Relation Age of Onset  . Heart disease Brother   . Colon cancer Neg Hx   . Cancer Neg Hx       Review of Systems  DATA OBTAINED: from patient, nurse GENERAL:  no fevers, fatigue, appetite changes SKIN: No itching, or rash EYES: No eye pain, redness, discharge EARS: No earache, tinnitus, change in hearing NOSE: No congestion, drainage or bleeding  MOUTH/THROAT: No mouth or tooth pain, No sore throat RESPIRATORY: No cough, wheezing, SOB CARDIAC: No chest pain, palpitations, lower extremity edema  GI: No abdominal pain, No N/V/D or constipation, No heartburn or reflux  GU: No dysuria, frequency or urgency, or incontinence  MUSCULOSKELETAL: No unrelieved bone/joint pain NEUROLOGIC: No headache, dizziness or focal weakness PSYCHIATRIC: No c/o anxiety or sadness   Vitals:   03/26/18 1129  BP: 133/60  Pulse: 64  Resp: 20  Temp: (!) 97 F (36.1 C)  SpO2: 96%    SpO2 Readings from Last 1 Encounters:  03/26/18 96%   Body mass index is 24.94 kg/m.     Physical Exam  GENERAL APPEARANCE: Alert, conversant,  No acute distress.  SKIN: Trace redness to left lower extremity HEAD: Normocephalic, atraumatic  EYES: Conjunctiva/lids clear.  Pupils round, reactive. EOMs intact.  EARS: External exam WNL, canals clear. Hearing grossly normal.  NOSE: No deformity or discharge.  MOUTH/THROAT: Lips w/o lesions  RESPIRATORY: Breathing is even, unlabored. Lung sounds are clear   CARDIOVASCULAR: Heart RRR 2/6 systolic murmur, no  rubs or gallops. No peripheral edema.   GASTROINTESTINAL: Abdomen is soft, non-tender, not distended w/ normal bowel sounds. GENITOURINARY: Bladder non tender, not distended  MUSCULOSKELETAL: No abnormal joints or musculature NEUROLOGIC:  Cranial nerves 2-12 grossly intact. Moves all extremities  PSYCHIATRIC: Mood and affect appropriate to situation, no behavioral issues  Patient Active Problem List   Diagnosis Date Noted  . Pressure injury of skin 03/16/2018  . Lactic acidosis 03/16/2018  . Diabetes mellitus type 2 in nonobese ([place]) 03/16/2018  . Cellulitis 03/15/2018  . Chest pain 05/02/2017  .  Diabetes mellitus with complication ([place]) 52/84/1324  . Renal insufficiency 05/02/2017  . Leukocytosis 05/02/2017  . Chronic pain 05/02/2017  . Atypical chest pain   . GERD (gastroesophageal reflux disease) 02/13/2014  . Constipation 02/13/2014  . Hypothyroidism 02/13/2014  . Hypertension 02/13/2014  . Proximal humeral fracture 02/04/2014  . Stricture and stenosis of esophagus 02/28/2011  . Chronic duodenal ulcer without mention of hemorrhage, perforation, or obstruction 02/28/2011  . DM 01/19/2011      Labs reviewed: Basic Metabolic Panel:    Component Value Date/Time   NA 138 03/18/2018 1143   K 4.0 03/18/2018 1143   CL 103 03/18/2018 1143   CO2 26 03/18/2018 1143   GLUCOSE 148 (H) 03/18/2018 1143   BUN 10 03/18/2018 1143   CREATININE 1.12 (H) 03/18/2018 1143   CALCIUM 8.6 (L) 03/18/2018 1143   PROT 6.1 (L) 03/17/2018 0536   ALBUMIN 2.5 (L) 03/17/2018 0536   AST 18 03/17/2018 0536   ALT 11 (L) 03/17/2018 0536   ALKPHOS 108 03/17/2018 0536   BILITOT 0.4 03/17/2018 0536   GFRNONAA 41 (L)  03/18/2018 1143   GFRAA 48 (L) 03/18/2018 1143    Recent Labs    03/16/18 0414 03/17/18 0536 03/18/18 1143  NA 140 140 138  K 4.4 4.1 4.0  CL 105 104 103  CO2 28 27 26   GLUCOSE 101* 104* 148*  BUN 22* 15 10  CREATININE 1.23* 1.22* 1.12*  CALCIUM 8.7* 8.8* 8.6*   Liver Function Tests: Recent Labs    03/15/18 1253 03/17/18 0536  AST 18 18  ALT 13* 11*  ALKPHOS 117 108  BILITOT 0.6 0.4  PROT 6.8 6.1*  ALBUMIN 2.9* 2.5*   No results for input(s): LIPASE, AMYLASE in the last 8760 hours. No results for input(s): AMMONIA in the last 8760 hours. CBC: Recent Labs    03/15/18 1253  03/19/18 0653 03/20/18 0451 03/21/18 0538  WBC 9.5   < > 13.7* 12.7* 12.4*  NEUTROABS 7.3  --   --   --   --   HGB 8.5*   < > 8.5* 8.4* 8.3*  HCT 29.6*   < > 30.1* 30.2* 29.5*  MCV 83.9   < > 84.6 86.3 84.8  PLT 324   < > 304 329 312   < > = values in this interval not displayed.   Lipid No results for input(s): CHOL, HDL, LDLCALC, TRIG in the last 8760 hours.  Cardiac Enzymes: Recent Labs    05/02/17 1414 05/02/17 1944  TROPONINI 0.03* 0.03*   BNP: No results for input(s): BNP in the last 8760 hours. No results found for: MICROALBUR No results found for: HGBA1C Lab Results  Component Value Date   TSH 13.697 (H) 03/16/2018   Lab Results  Component Value Date   VITAMINB12 467 03/15/2018   Lab Results  Component Value Date   FOLATE 28.0 03/15/2018   Lab Results  Component Value Date   IRON 14 (L) 03/15/2018   TIBC 371 03/15/2018   FERRITIN 10 (L) 03/15/2018    Imaging and Procedures obtained prior to SNF admission: Dg Tibia/fibula Left  Result Date: 03/15/2018 CLINICAL DATA:  Pain and swelling for 2 days. EXAM: LEFT TIBIA AND FIBULA - 2 VIEW COMPARISON:  None. FINDINGS: Diffuse subcutaneous soft tissue swelling/edema. No gas is seen in the soft tissues. Moderate to advanced degenerative changes involving the knee joint. The ankle joint is maintained. No acute bony  findings or destructive bony changes. Vascular calcifications are noted.  IMPRESSION: No acute bony findings. Diffuse soft tissue swelling/edema. Electronically Signed   By: Marijo Sanes M.D.   On: 03/15/2018 18:45   Dg Chest Port 1 View  Result Date: 03/15/2018 CLINICAL DATA:  Leg swelling and redness EXAM: PORTABLE CHEST 1 VIEW COMPARISON:  05/02/2017 FINDINGS: Status post left shoulder replacement. Streaky atelectasis at the left base. No focal consolidation. Stable slightly enlarged cardiomediastinal silhouette with aortic atherosclerosis. No pneumothorax. Chronic right upper rib deformity. IMPRESSION: No active disease. Minimal atelectasis at the left base. Mild cardiomegaly. Electronically Signed   By: Donavan Foil M.D.   On: 03/15/2018 22:45     Not all labs, radiology exams or other studies done during hospitalization come through on my EPIC note; however they are reviewed by me.    Assessment and Plan  Cellulitis bilateral lower extremities left greater than right-patient completed 5 days of IV vancomycin and IV Rocephin; patient started on Keflex 4/16; pro calcitonin 0.1, lactic acid 1.0, blood cultures x2 with no organism grown to date SNF -admitted for OT/PT.  Continue Keflex 250 mg every 8 hours for 7 days beyond 416 which makes and date 4/23  Acute hypoxic respiratory failure/mild pulmonary edema-resolved  Hypothyroidism SNF -not stated as uncontrolled; continue Synthroid 112 mcg daily  Chronic normocytic anemia/iron deficiency anemia-baseline hemoglobin of 9; hemoglobin 8.2 in hospital with low ferritin, iron and saturation SNF -continue iron 325 mg daily; follow-up CBC  Diabetes mellitus type 2 SNF -no status uncontrolled; continue Amaryl 2 mg daily  GERD SNF -not stated as uncontrolled; continue Prevacid 50 mg twice daily    Time spent greater than 45 minutes;> 50% of time with patient was spent reviewing records, labs, tests and studies, counseling and developing  plan of care  Webb Silversmith D. Sheppard Coil, MD

## 2018-03-27 LAB — CBC AND DIFFERENTIAL
HEMATOCRIT: 31 — AB (ref 36–46)
HEMOGLOBIN: 9 — AB (ref 12.0–16.0)
Neutrophils Absolute: 7
Platelets: 399 (ref 150–399)
WBC: 10.2

## 2018-03-31 ENCOUNTER — Encounter: Payer: Self-pay | Admitting: Internal Medicine

## 2018-04-01 DIAGNOSIS — J9601 Acute respiratory failure with hypoxia: Secondary | ICD-10-CM | POA: Insufficient documentation

## 2018-04-01 DIAGNOSIS — D649 Anemia, unspecified: Secondary | ICD-10-CM | POA: Insufficient documentation

## 2018-04-01 DIAGNOSIS — D509 Iron deficiency anemia, unspecified: Secondary | ICD-10-CM | POA: Insufficient documentation

## 2018-04-05 ENCOUNTER — Non-Acute Institutional Stay (SKILLED_NURSING_FACILITY): Payer: PPO | Admitting: Internal Medicine

## 2018-04-05 ENCOUNTER — Other Ambulatory Visit: Payer: Self-pay | Admitting: *Deleted

## 2018-04-05 DIAGNOSIS — L03116 Cellulitis of left lower limb: Secondary | ICD-10-CM | POA: Diagnosis not present

## 2018-04-05 DIAGNOSIS — K2100 Gastro-esophageal reflux disease with esophagitis, without bleeding: Secondary | ICD-10-CM

## 2018-04-05 DIAGNOSIS — E119 Type 2 diabetes mellitus without complications: Secondary | ICD-10-CM | POA: Diagnosis not present

## 2018-04-05 DIAGNOSIS — J9601 Acute respiratory failure with hypoxia: Secondary | ICD-10-CM | POA: Diagnosis not present

## 2018-04-05 DIAGNOSIS — D649 Anemia, unspecified: Secondary | ICD-10-CM

## 2018-04-05 DIAGNOSIS — E034 Atrophy of thyroid (acquired): Secondary | ICD-10-CM

## 2018-04-05 DIAGNOSIS — L03115 Cellulitis of right lower limb: Secondary | ICD-10-CM | POA: Diagnosis not present

## 2018-04-05 DIAGNOSIS — D508 Other iron deficiency anemias: Secondary | ICD-10-CM | POA: Diagnosis not present

## 2018-04-05 NOTE — Patient Outreach (Addendum)
[facility]) Care Management  04/05/2018  Casey Watts 1924/11/06 [id number]   Met with patient at facility. Patient reports she has not heard exactly when she is going home. She states she has 6 children and 2 private pay caregivers.  She anticipates that she will continue to have good support when home.  RNCM reviewed [facility] care management program.  [facility] packet left with patient Patient agrees to [facility] care management phone calls, does not feel she needs home visits at this time.  Verbal consent received from patient.   Noted that [facility] had already placed [facility] CM referral will let them know patient requesting phone calls. Royetta Crochet. Laymond Purser, RN, BSN, [place] [phone]) Business Cell  [phone]) Toll Free Office

## 2018-04-06 ENCOUNTER — Other Ambulatory Visit: Payer: Self-pay

## 2018-04-06 NOTE — Patient Outreach (Signed)
[facility]) Care Management  04/06/2018  Casey Watts [birth year] [id number]  Transition of care  Referral date: 04/05/18 Referral source: Utilization management Insurance: Health team advantage  Telephone call to [facility] rehab to confirm patient still admitted to facility.  Office personal confirmed patient still at rehab facility with potential discharge date of 04/08/18.   RNCM will follow up with patient post skilled nursing facility discharge.   Quinn Plowman RN,BSN,CCM [facility] Telephonic  [phone]

## 2018-04-09 DIAGNOSIS — D509 Iron deficiency anemia, unspecified: Secondary | ICD-10-CM | POA: Diagnosis not present

## 2018-04-09 DIAGNOSIS — M2100 Valgus deformity, not elsewhere classified, unspecified site: Secondary | ICD-10-CM | POA: Diagnosis not present

## 2018-04-09 DIAGNOSIS — E038 Other specified hypothyroidism: Secondary | ICD-10-CM | POA: Diagnosis not present

## 2018-04-09 DIAGNOSIS — R269 Unspecified abnormalities of gait and mobility: Secondary | ICD-10-CM | POA: Diagnosis not present

## 2018-04-09 DIAGNOSIS — R7303 Prediabetes: Secondary | ICD-10-CM | POA: Diagnosis not present

## 2018-04-09 DIAGNOSIS — L89159 Pressure ulcer of sacral region, unspecified stage: Secondary | ICD-10-CM | POA: Diagnosis not present

## 2018-04-09 DIAGNOSIS — I10 Essential (primary) hypertension: Secondary | ICD-10-CM | POA: Diagnosis not present

## 2018-04-09 DIAGNOSIS — N189 Chronic kidney disease, unspecified: Secondary | ICD-10-CM | POA: Diagnosis not present

## 2018-04-10 ENCOUNTER — Ambulatory Visit: Payer: Self-pay

## 2018-04-11 DIAGNOSIS — L89301 Pressure ulcer of unspecified buttock, stage 1: Secondary | ICD-10-CM | POA: Diagnosis not present

## 2018-04-11 DIAGNOSIS — R21 Rash and other nonspecific skin eruption: Secondary | ICD-10-CM | POA: Diagnosis not present

## 2018-04-12 ENCOUNTER — Other Ambulatory Visit: Payer: Self-pay

## 2018-04-12 NOTE — Patient Outreach (Signed)
[facility]) Care Management  04/12/2018  Casey Watts Mar 27, 1924 [id number]   Transition of care  Referral date: 04/05/18 Referral source: Utilization management Insurance: Health team advantage  Telephone call to patient regarding transition of care follow up. HIPAA verified by patient. Explained reason for call. Patient states she was discharged from the skilled nursing facility on Sunday 04/08/18. States she was in the hospital due to cellullitis.  Patient states she currently resides in the [facility] assisted living facility. Patient states she was told the cellulitis has cleared.  Patient states she is unsure whether she has a follow up scheduled with her doctor.  States her daughter handles this. Patient gave verbal authorization to speak with her daughter, Casey Watts regarding her health information. Patient states she is unsure whether she will be receiving home health Patient reports she is given her medications by the nurse at [facility]. Patient states she does have transportation to her appointments. Patient denies having any symptoms or concerns at this time.  Patient verbally agreed to received [facility] care management brochure/ magnet.   RNCM attempted call to patients daughter, Casey Watts. HIPAA compliant voice message left with call back phone number.   PLAN; RNCM will attempt follow up with patients daughter within 4 business days.   Quinn Plowman RN,BSN,CCM [facility] Telephonic  [phone]

## 2018-04-14 NOTE — Progress Notes (Signed)
Location:  [place] of Service:    [place] facility Provider: Hennie Duos MD   PCP: Haywood Pao, MD Patient Care Team: Tisovec, Fransico Him, MD as PCP - General (Internal Medicine) Dannielle Karvonen, RN as [facility] Management  Extended Emergency Contact Information Primary Emergency Contact: [place] Mobile Phone: [phone] Relation: Son Secondary Emergency Contact: [place] Mobile Phone: [phone] Relation: Daughter  No Known Allergies  Chief Complaint  Patient presents with  . Discharge Note    HPI:  84 y.o. female with hypertension, hypothyroidism, anemia, diabetes mellitus type 2, and GERD, who presented to the emergency department with complaints of left leg swelling over the prior 2 days that have been progressive.  Patient was admitted to [facility] from 4/11-18 where she was treated for cellulitis of bilateral lower extremities worse on the left.  Patient was treated initially with vancomycin, then Rocephin then Keflex.  Patient had a venous Doppler ultrasound which showed no sign of DVT.  Hospital course was complicated by acute hypoxic respiratory failure secondary to mild pulmonary edema which has resolved.  Patient was admitted to skilled nursing facility for OT/PT and is now ready to be discharged home assisted living facility.    Past Medical History:  Diagnosis Date  . Anemia   . Arthritis   . Asthma    as a child  . Cancer ([place])    skin cancer on left leg  . Cataract   . Cellulitis and abscess of lower extremity 03/21/2018  . Diabetes mellitus    TYPE 2  . Esophageal stricture   . GERD (gastroesophageal reflux disease)   . Glaucoma   . Hypertension   . Hypothyroidism   . Thyroid disease     Past Surgical History:  Procedure Laterality Date  . APPENDECTOMY    . CATARACT EXTRACTION Left   . EYE SURGERY      . FOOT SURGERY Bilateral    Bunionectomy  . REVERSE SHOULDER ARTHROPLASTY Left 02/04/2014   DR MURPHY  . REVERSE SHOULDER ARTHROPLASTY Left 02/04/2014   Procedure: REVERSE SHOULDER ARTHROPLASTY;  Surgeon: Renette Butters, MD;  Location: [place];  Service: Orthopedics;  Laterality: Left;     reports that she has never smoked. She has never used smokeless tobacco. She reports that she drinks alcohol. She reports that she does not use drugs. Social History   Socioeconomic History  . Marital status: Widowed    Spouse name: Not on file  . Number of children: Not on file  . Years of education: Not on file  . Highest education level: Not on file  Occupational History  . Occupation: Retired  Scientific laboratory technician  . Financial resource strain: Not on file  . Food insecurity:    Worry: Not on file    Inability: Not on file  . Transportation needs:    Medical: Not on file    Non-medical: Not on file  Tobacco Use  . Smoking status: Never Smoker  . Smokeless tobacco: Never Used  Substance and Sexual Activity  . Alcohol use: Yes    Comment: occasional  . Drug use: No  . Sexual activity: Not on file  Lifestyle  . Physical activity:    Days per week: Not on file    Minutes per session: Not on file  . Stress: Not on file  Relationships  . Social connections:  Talks on phone: Not on file    Gets together: Not on file    Attends religious service: Not on file    Active member of club or organization: Not on file    Attends meetings of clubs or organizations: Not on file    Relationship status: Not on file  . Intimate partner violence:    Fear of current or ex partner: Not on file    Emotionally abused: Not on file    Physically abused: Not on file    Forced sexual activity: Not on file  Other Topics Concern  . Not on file  Social History Narrative  . Not on file    Pertinent  Health Maintenance Due  Topic Date Due  . HEMOGLOBIN A1C  1924/03/28  . FOOT EXAM  02/27/1936  .  OPHTHALMOLOGY EXAM  02/27/1936  . URINE MICROALBUMIN  02/27/1936  . DEXA SCAN  02/27/1991  . PNA vac Low Risk Adult (1 of 2 - PCV13) 02/27/1991  . INFLUENZA VACCINE  07/05/2018    Medications: Allergies as of 04/05/2018   No Known Allergies     Medication List        Accurate as of 04/05/18 11:59 PM. Always use your most recent med list.          calcium-vitamin D 500-200 MG-UNIT tablet Commonly known as:  OSCAL WITH D Take 1 tablet by mouth daily with breakfast.   cephALEXin 250 MG capsule Commonly known as:  KEFLEX Take 1 capsule (250 mg total) by mouth every 8 (eight) hours.   cholecalciferol 1000 units tablet Commonly known as:  VITAMIN D Take 1,000 Units by mouth daily.   docusate sodium 100 MG capsule Commonly known as:  COLACE Take 1 capsule (100 mg total) by mouth 2 (two) times daily as needed for mild constipation.   ferrous sulfate 325 (65 FE) MG tablet Take 1 tablet (325 mg total) by mouth daily with breakfast.   glimepiride 2 MG tablet Commonly known as:  AMARYL Take 2 mg by mouth daily with breakfast.   HYDROcodone-acetaminophen 5-325 MG tablet Commonly known as:  NORCO/VICODIN Take 1 tablet by mouth every 4 (four) hours as needed for moderate pain. Take one tablet by mouth every 4 hours as needed for pain   lansoprazole 15 MG capsule Commonly known as:  PREVACID Take 1 capsule (15 mg total) by mouth 2 (two) times daily before a meal.   levothyroxine 112 MCG tablet Commonly known as:  SYNTHROID, LEVOTHROID Take 112 mcg by mouth daily. 1/2 tab on Sunday   SIMPLY SALINE NA Place 1 spray into both nostrils at bedtime as needed (nasal congestion).   vitamin C 500 MG tablet Commonly known as:  ASCORBIC ACID Take 500 mg by mouth daily.        Vitals:   04/29/18 1623  BP: 133/62  Pulse: 64  Temp: (!) 97 F (36.1 C)  Weight: 132 lb (59.9 kg)  Height: 5\' 1"  (1.549 m)   Body mass index is 24.94 kg/m.  Physical Exam  GENERAL APPEARANCE:  Alert, conversant. No acute distress.  HEENT: Unremarkable. RESPIRATORY: Breathing is even, unlabored. Lung sounds are clear   CARDIOVASCULAR: Heart RRR 2/6 murmur, no rubs or gallops. No peripheral edema.  GASTROINTESTINAL: Abdomen is soft, non-tender, not distended w/ normal bowel sounds.  NEUROLOGIC: Cranial nerves 2-12 grossly intact. Moves all extremities   Labs reviewed: Basic Metabolic Panel: Recent Labs    03/16/18 0414 03/17/18 0536 03/18/18 1143  NA  140 140 138  K 4.4 4.1 4.0  CL 105 104 103  CO2 28 27 26   GLUCOSE 101* 104* 148*  BUN 22* 15 10  CREATININE 1.23* 1.22* 1.12*  CALCIUM 8.7* 8.8* 8.6*   No results found for: [facility] Liver Function Tests: Recent Labs    03/15/18 1253 03/17/18 0536  AST 18 18  ALT 13* 11*  ALKPHOS 117 108  BILITOT 0.6 0.4  PROT 6.8 6.1*  ALBUMIN 2.9* 2.5*   No results for input(s): LIPASE, AMYLASE in the last 8760 hours. No results for input(s): AMMONIA in the last 8760 hours. CBC: Recent Labs    03/15/18 1253  03/19/18 0653 03/20/18 0451 03/21/18 0538 03/27/18  WBC 9.5   < > 13.7* 12.7* 12.4* 10.2  NEUTROABS 7.3  --   --   --   --  7  HGB 8.5*   < > 8.5* 8.4* 8.3* 9.0*  HCT 29.6*   < > 30.1* 30.2* 29.5* 31*  MCV 83.9   < > 84.6 86.3 84.8  --   PLT 324   < > 304 329 312 399   < > = values in this interval not displayed.   Lipid No results for input(s): CHOL, HDL, LDLCALC, TRIG in the last 8760 hours. Cardiac Enzymes: Recent Labs    05/02/17 1414 05/02/17 1944  TROPONINI 0.03* 0.03*   BNP: No results for input(s): BNP in the last 8760 hours. CBG: Recent Labs    03/21/18 2244 03/22/18 0644 03/22/18 1118  GLUCAP 215* 149* 161*    Procedures and Imaging Studies During Stay: No results found.  Assessment/Plan:   Cellulitis of both lower extremities  Acute respiratory failure with hypoxia (HCC)  Hypothyroidism due to acquired atrophy of thyroid  Chronic anemia  Iron deficiency anemia secondary to  inadequate dietary iron intake  Gastroesophageal reflux disease with esophagitis  Diabetes mellitus type 2 in nonobese [facility])   Patient is being discharged with the following home health services: OT/PT/nursing  Patient is being discharged with the following durable medical equipment: None  Patient has been advised to f/u with their PCP in 1-2 weeks to bring them up to date on their rehab stay.  Social services at facility was responsible for arranging this appointment.  Pt was provided with a 30 day supply of prescriptions for medications and refills must be obtained from their PCP.  For controlled substances, a more limited supply may be provided adequate until PCP appointment only.  Patient is have been reconciled.  Time spent greater than 35 minutes;> 50% of time with patient was spent reviewing records, labs, tests and studies, counseling and developing plan of care  Inocencio Homes, MD

## 2018-04-15 DIAGNOSIS — Z7409 Other reduced mobility: Secondary | ICD-10-CM | POA: Diagnosis not present

## 2018-04-15 DIAGNOSIS — I872 Venous insufficiency (chronic) (peripheral): Secondary | ICD-10-CM | POA: Diagnosis not present

## 2018-04-15 DIAGNOSIS — Z872 Personal history of diseases of the skin and subcutaneous tissue: Secondary | ICD-10-CM | POA: Diagnosis not present

## 2018-04-15 DIAGNOSIS — L853 Xerosis cutis: Secondary | ICD-10-CM | POA: Diagnosis not present

## 2018-04-15 DIAGNOSIS — L89159 Pressure ulcer of sacral region, unspecified stage: Secondary | ICD-10-CM | POA: Diagnosis not present

## 2018-04-17 ENCOUNTER — Other Ambulatory Visit: Payer: Self-pay

## 2018-04-17 NOTE — Patient Outreach (Signed)
[facility]) Care Management  04/17/2018  Casey Watts 1924/09/16 [id number]  Transition of care  Referral date:04/05/18 Referral source:Utilization management Insurance:Health team advantage   Telephone call to patient regarding transition  Of care follow up. . Unable to reach . HIPAA compliant voice message left with call back phone number.   PLAN: RNCM will attempt 2nd telephone call to patient within 4 business days. RNCM will send outreach letter.   Quinn Plowman RN,BSN,CCM [facility] Telephonic  [phone]

## 2018-04-24 DIAGNOSIS — N189 Chronic kidney disease, unspecified: Secondary | ICD-10-CM | POA: Diagnosis not present

## 2018-04-24 DIAGNOSIS — I129 Hypertensive chronic kidney disease with stage 1 through stage 4 chronic kidney disease, or unspecified chronic kidney disease: Secondary | ICD-10-CM | POA: Diagnosis not present

## 2018-04-24 DIAGNOSIS — M1991 Primary osteoarthritis, unspecified site: Secondary | ICD-10-CM | POA: Diagnosis not present

## 2018-04-24 DIAGNOSIS — Z96612 Presence of left artificial shoulder joint: Secondary | ICD-10-CM | POA: Diagnosis not present

## 2018-04-24 DIAGNOSIS — J45909 Unspecified asthma, uncomplicated: Secondary | ICD-10-CM | POA: Diagnosis not present

## 2018-04-24 DIAGNOSIS — D631 Anemia in chronic kidney disease: Secondary | ICD-10-CM | POA: Diagnosis not present

## 2018-04-24 DIAGNOSIS — E039 Hypothyroidism, unspecified: Secondary | ICD-10-CM | POA: Diagnosis not present

## 2018-04-24 DIAGNOSIS — Z85828 Personal history of other malignant neoplasm of skin: Secondary | ICD-10-CM | POA: Diagnosis not present

## 2018-04-24 DIAGNOSIS — Z7984 Long term (current) use of oral hypoglycemic drugs: Secondary | ICD-10-CM | POA: Diagnosis not present

## 2018-04-24 DIAGNOSIS — E1122 Type 2 diabetes mellitus with diabetic chronic kidney disease: Secondary | ICD-10-CM | POA: Diagnosis not present

## 2018-04-24 DIAGNOSIS — M84422D Pathological fracture, left humerus, subsequent encounter for fracture with routine healing: Secondary | ICD-10-CM | POA: Diagnosis not present

## 2018-04-24 DIAGNOSIS — H409 Unspecified glaucoma: Secondary | ICD-10-CM | POA: Diagnosis not present

## 2018-04-24 DIAGNOSIS — K219 Gastro-esophageal reflux disease without esophagitis: Secondary | ICD-10-CM | POA: Diagnosis not present

## 2018-04-25 DIAGNOSIS — R609 Edema, unspecified: Secondary | ICD-10-CM | POA: Diagnosis not present

## 2018-04-25 DIAGNOSIS — I10 Essential (primary) hypertension: Secondary | ICD-10-CM | POA: Diagnosis not present

## 2018-04-26 ENCOUNTER — Ambulatory Visit: Payer: Self-pay

## 2018-04-26 DIAGNOSIS — Z96612 Presence of left artificial shoulder joint: Secondary | ICD-10-CM | POA: Diagnosis not present

## 2018-04-26 DIAGNOSIS — Z7984 Long term (current) use of oral hypoglycemic drugs: Secondary | ICD-10-CM | POA: Diagnosis not present

## 2018-04-26 DIAGNOSIS — M1991 Primary osteoarthritis, unspecified site: Secondary | ICD-10-CM | POA: Diagnosis not present

## 2018-04-26 DIAGNOSIS — E1122 Type 2 diabetes mellitus with diabetic chronic kidney disease: Secondary | ICD-10-CM | POA: Diagnosis not present

## 2018-04-26 DIAGNOSIS — H409 Unspecified glaucoma: Secondary | ICD-10-CM | POA: Diagnosis not present

## 2018-04-26 DIAGNOSIS — Z85828 Personal history of other malignant neoplasm of skin: Secondary | ICD-10-CM | POA: Diagnosis not present

## 2018-04-26 DIAGNOSIS — J45909 Unspecified asthma, uncomplicated: Secondary | ICD-10-CM | POA: Diagnosis not present

## 2018-04-26 DIAGNOSIS — N189 Chronic kidney disease, unspecified: Secondary | ICD-10-CM | POA: Diagnosis not present

## 2018-04-26 DIAGNOSIS — I129 Hypertensive chronic kidney disease with stage 1 through stage 4 chronic kidney disease, or unspecified chronic kidney disease: Secondary | ICD-10-CM | POA: Diagnosis not present

## 2018-04-26 DIAGNOSIS — M84422D Pathological fracture, left humerus, subsequent encounter for fracture with routine healing: Secondary | ICD-10-CM | POA: Diagnosis not present

## 2018-04-26 DIAGNOSIS — D631 Anemia in chronic kidney disease: Secondary | ICD-10-CM | POA: Diagnosis not present

## 2018-04-26 DIAGNOSIS — K219 Gastro-esophageal reflux disease without esophagitis: Secondary | ICD-10-CM | POA: Diagnosis not present

## 2018-04-26 DIAGNOSIS — E039 Hypothyroidism, unspecified: Secondary | ICD-10-CM | POA: Diagnosis not present

## 2018-04-27 ENCOUNTER — Other Ambulatory Visit: Payer: Self-pay

## 2018-04-27 NOTE — Patient Outreach (Signed)
[facility]) Care Management  04/27/2018  Casey Watts [birth year] [id number]  Transition of care  Referral date:04/05/18 Referral source:Utilization management Insurance:Health team advantage Attempt #3  Telephone call to patient regarding transition  Of care follow up. . Unable to reach . HIPAA compliant voice message left with call back phone number.   PLAN: RNCM will attempt 3rd  telephone call to patient within 4 business days.  Quinn Plowman RN,BSN,CCM [facility] Telephonic  [phone]

## 2018-04-29 ENCOUNTER — Encounter: Payer: Self-pay | Admitting: Internal Medicine

## 2018-05-01 ENCOUNTER — Other Ambulatory Visit: Payer: Self-pay

## 2018-05-01 DIAGNOSIS — R609 Edema, unspecified: Secondary | ICD-10-CM | POA: Diagnosis not present

## 2018-05-01 DIAGNOSIS — F0390 Unspecified dementia without behavioral disturbance: Secondary | ICD-10-CM | POA: Diagnosis not present

## 2018-05-01 DIAGNOSIS — E119 Type 2 diabetes mellitus without complications: Secondary | ICD-10-CM | POA: Diagnosis not present

## 2018-05-01 DIAGNOSIS — I10 Essential (primary) hypertension: Secondary | ICD-10-CM | POA: Diagnosis not present

## 2018-05-01 DIAGNOSIS — L0390 Cellulitis, unspecified: Secondary | ICD-10-CM | POA: Diagnosis not present

## 2018-05-01 NOTE — Patient Outreach (Signed)
[facility]) Care Management  05/01/2018  AZELEA SEGUIN [birth year] [id number]   Transition of care  Referral date:04/05/18 Referral source:Utilization management Insurance:Health team advantage Attempt #3  Telephone call to patient regarding transition Of care follow up. . Unable to reach . HIPAA compliant voice message left with call back phone number.   PLAN: If no return call will proceed with case closure.   Quinn Plowman RN,BSN,CCM [facility] Telephonic  [phone]

## 2018-05-02 DIAGNOSIS — J45909 Unspecified asthma, uncomplicated: Secondary | ICD-10-CM | POA: Diagnosis not present

## 2018-05-02 DIAGNOSIS — E039 Hypothyroidism, unspecified: Secondary | ICD-10-CM | POA: Diagnosis not present

## 2018-05-02 DIAGNOSIS — I129 Hypertensive chronic kidney disease with stage 1 through stage 4 chronic kidney disease, or unspecified chronic kidney disease: Secondary | ICD-10-CM | POA: Diagnosis not present

## 2018-05-02 DIAGNOSIS — H409 Unspecified glaucoma: Secondary | ICD-10-CM | POA: Diagnosis not present

## 2018-05-02 DIAGNOSIS — D631 Anemia in chronic kidney disease: Secondary | ICD-10-CM | POA: Diagnosis not present

## 2018-05-02 DIAGNOSIS — Z85828 Personal history of other malignant neoplasm of skin: Secondary | ICD-10-CM | POA: Diagnosis not present

## 2018-05-02 DIAGNOSIS — Z96612 Presence of left artificial shoulder joint: Secondary | ICD-10-CM | POA: Diagnosis not present

## 2018-05-02 DIAGNOSIS — M1991 Primary osteoarthritis, unspecified site: Secondary | ICD-10-CM | POA: Diagnosis not present

## 2018-05-02 DIAGNOSIS — N189 Chronic kidney disease, unspecified: Secondary | ICD-10-CM | POA: Diagnosis not present

## 2018-05-02 DIAGNOSIS — M84422D Pathological fracture, left humerus, subsequent encounter for fracture with routine healing: Secondary | ICD-10-CM | POA: Diagnosis not present

## 2018-05-02 DIAGNOSIS — Z7984 Long term (current) use of oral hypoglycemic drugs: Secondary | ICD-10-CM | POA: Diagnosis not present

## 2018-05-02 DIAGNOSIS — K219 Gastro-esophageal reflux disease without esophagitis: Secondary | ICD-10-CM | POA: Diagnosis not present

## 2018-05-02 DIAGNOSIS — E1122 Type 2 diabetes mellitus with diabetic chronic kidney disease: Secondary | ICD-10-CM | POA: Diagnosis not present

## 2018-05-07 DIAGNOSIS — H409 Unspecified glaucoma: Secondary | ICD-10-CM | POA: Diagnosis not present

## 2018-05-07 DIAGNOSIS — I129 Hypertensive chronic kidney disease with stage 1 through stage 4 chronic kidney disease, or unspecified chronic kidney disease: Secondary | ICD-10-CM | POA: Diagnosis not present

## 2018-05-07 DIAGNOSIS — E1122 Type 2 diabetes mellitus with diabetic chronic kidney disease: Secondary | ICD-10-CM | POA: Diagnosis not present

## 2018-05-07 DIAGNOSIS — Z7984 Long term (current) use of oral hypoglycemic drugs: Secondary | ICD-10-CM | POA: Diagnosis not present

## 2018-05-07 DIAGNOSIS — M84422D Pathological fracture, left humerus, subsequent encounter for fracture with routine healing: Secondary | ICD-10-CM | POA: Diagnosis not present

## 2018-05-07 DIAGNOSIS — K219 Gastro-esophageal reflux disease without esophagitis: Secondary | ICD-10-CM | POA: Diagnosis not present

## 2018-05-07 DIAGNOSIS — Z96612 Presence of left artificial shoulder joint: Secondary | ICD-10-CM | POA: Diagnosis not present

## 2018-05-07 DIAGNOSIS — Z79899 Other long term (current) drug therapy: Secondary | ICD-10-CM | POA: Diagnosis not present

## 2018-05-07 DIAGNOSIS — J45909 Unspecified asthma, uncomplicated: Secondary | ICD-10-CM | POA: Diagnosis not present

## 2018-05-07 DIAGNOSIS — E039 Hypothyroidism, unspecified: Secondary | ICD-10-CM | POA: Diagnosis not present

## 2018-05-07 DIAGNOSIS — D631 Anemia in chronic kidney disease: Secondary | ICD-10-CM | POA: Diagnosis not present

## 2018-05-07 DIAGNOSIS — M1991 Primary osteoarthritis, unspecified site: Secondary | ICD-10-CM | POA: Diagnosis not present

## 2018-05-07 DIAGNOSIS — E119 Type 2 diabetes mellitus without complications: Secondary | ICD-10-CM | POA: Diagnosis not present

## 2018-05-07 DIAGNOSIS — F0390 Unspecified dementia without behavioral disturbance: Secondary | ICD-10-CM | POA: Diagnosis not present

## 2018-05-07 DIAGNOSIS — N189 Chronic kidney disease, unspecified: Secondary | ICD-10-CM | POA: Diagnosis not present

## 2018-05-07 DIAGNOSIS — I10 Essential (primary) hypertension: Secondary | ICD-10-CM | POA: Diagnosis not present

## 2018-05-07 DIAGNOSIS — Z85828 Personal history of other malignant neoplasm of skin: Secondary | ICD-10-CM | POA: Diagnosis not present

## 2018-05-07 DIAGNOSIS — L0390 Cellulitis, unspecified: Secondary | ICD-10-CM | POA: Diagnosis not present

## 2018-05-14 DIAGNOSIS — Z85828 Personal history of other malignant neoplasm of skin: Secondary | ICD-10-CM | POA: Diagnosis not present

## 2018-05-14 DIAGNOSIS — D631 Anemia in chronic kidney disease: Secondary | ICD-10-CM | POA: Diagnosis not present

## 2018-05-14 DIAGNOSIS — N189 Chronic kidney disease, unspecified: Secondary | ICD-10-CM | POA: Diagnosis not present

## 2018-05-14 DIAGNOSIS — H409 Unspecified glaucoma: Secondary | ICD-10-CM | POA: Diagnosis not present

## 2018-05-14 DIAGNOSIS — M1991 Primary osteoarthritis, unspecified site: Secondary | ICD-10-CM | POA: Diagnosis not present

## 2018-05-14 DIAGNOSIS — Z96612 Presence of left artificial shoulder joint: Secondary | ICD-10-CM | POA: Diagnosis not present

## 2018-05-14 DIAGNOSIS — M84422D Pathological fracture, left humerus, subsequent encounter for fracture with routine healing: Secondary | ICD-10-CM | POA: Diagnosis not present

## 2018-05-14 DIAGNOSIS — E039 Hypothyroidism, unspecified: Secondary | ICD-10-CM | POA: Diagnosis not present

## 2018-05-14 DIAGNOSIS — Z7984 Long term (current) use of oral hypoglycemic drugs: Secondary | ICD-10-CM | POA: Diagnosis not present

## 2018-05-14 DIAGNOSIS — E1122 Type 2 diabetes mellitus with diabetic chronic kidney disease: Secondary | ICD-10-CM | POA: Diagnosis not present

## 2018-05-14 DIAGNOSIS — I129 Hypertensive chronic kidney disease with stage 1 through stage 4 chronic kidney disease, or unspecified chronic kidney disease: Secondary | ICD-10-CM | POA: Diagnosis not present

## 2018-05-14 DIAGNOSIS — J45909 Unspecified asthma, uncomplicated: Secondary | ICD-10-CM | POA: Diagnosis not present

## 2018-05-14 DIAGNOSIS — K219 Gastro-esophageal reflux disease without esophagitis: Secondary | ICD-10-CM | POA: Diagnosis not present

## 2018-05-17 DIAGNOSIS — J45909 Unspecified asthma, uncomplicated: Secondary | ICD-10-CM | POA: Diagnosis not present

## 2018-05-17 DIAGNOSIS — Z85828 Personal history of other malignant neoplasm of skin: Secondary | ICD-10-CM | POA: Diagnosis not present

## 2018-05-17 DIAGNOSIS — H409 Unspecified glaucoma: Secondary | ICD-10-CM | POA: Diagnosis not present

## 2018-05-17 DIAGNOSIS — Z7984 Long term (current) use of oral hypoglycemic drugs: Secondary | ICD-10-CM | POA: Diagnosis not present

## 2018-05-17 DIAGNOSIS — Z96612 Presence of left artificial shoulder joint: Secondary | ICD-10-CM | POA: Diagnosis not present

## 2018-05-17 DIAGNOSIS — I129 Hypertensive chronic kidney disease with stage 1 through stage 4 chronic kidney disease, or unspecified chronic kidney disease: Secondary | ICD-10-CM | POA: Diagnosis not present

## 2018-05-17 DIAGNOSIS — N189 Chronic kidney disease, unspecified: Secondary | ICD-10-CM | POA: Diagnosis not present

## 2018-05-17 DIAGNOSIS — M1991 Primary osteoarthritis, unspecified site: Secondary | ICD-10-CM | POA: Diagnosis not present

## 2018-05-17 DIAGNOSIS — E039 Hypothyroidism, unspecified: Secondary | ICD-10-CM | POA: Diagnosis not present

## 2018-05-17 DIAGNOSIS — D631 Anemia in chronic kidney disease: Secondary | ICD-10-CM | POA: Diagnosis not present

## 2018-05-17 DIAGNOSIS — E1122 Type 2 diabetes mellitus with diabetic chronic kidney disease: Secondary | ICD-10-CM | POA: Diagnosis not present

## 2018-05-17 DIAGNOSIS — K219 Gastro-esophageal reflux disease without esophagitis: Secondary | ICD-10-CM | POA: Diagnosis not present

## 2018-05-17 DIAGNOSIS — M84422D Pathological fracture, left humerus, subsequent encounter for fracture with routine healing: Secondary | ICD-10-CM | POA: Diagnosis not present

## 2018-05-23 DIAGNOSIS — E039 Hypothyroidism, unspecified: Secondary | ICD-10-CM | POA: Diagnosis not present

## 2018-05-23 DIAGNOSIS — I129 Hypertensive chronic kidney disease with stage 1 through stage 4 chronic kidney disease, or unspecified chronic kidney disease: Secondary | ICD-10-CM | POA: Diagnosis not present

## 2018-05-23 DIAGNOSIS — J45909 Unspecified asthma, uncomplicated: Secondary | ICD-10-CM | POA: Diagnosis not present

## 2018-05-23 DIAGNOSIS — E1122 Type 2 diabetes mellitus with diabetic chronic kidney disease: Secondary | ICD-10-CM | POA: Diagnosis not present

## 2018-05-23 DIAGNOSIS — K219 Gastro-esophageal reflux disease without esophagitis: Secondary | ICD-10-CM | POA: Diagnosis not present

## 2018-05-23 DIAGNOSIS — D631 Anemia in chronic kidney disease: Secondary | ICD-10-CM | POA: Diagnosis not present

## 2018-05-23 DIAGNOSIS — Z85828 Personal history of other malignant neoplasm of skin: Secondary | ICD-10-CM | POA: Diagnosis not present

## 2018-05-23 DIAGNOSIS — H409 Unspecified glaucoma: Secondary | ICD-10-CM | POA: Diagnosis not present

## 2018-05-23 DIAGNOSIS — M1991 Primary osteoarthritis, unspecified site: Secondary | ICD-10-CM | POA: Diagnosis not present

## 2018-05-23 DIAGNOSIS — Z96612 Presence of left artificial shoulder joint: Secondary | ICD-10-CM | POA: Diagnosis not present

## 2018-05-23 DIAGNOSIS — Z7984 Long term (current) use of oral hypoglycemic drugs: Secondary | ICD-10-CM | POA: Diagnosis not present

## 2018-05-23 DIAGNOSIS — N189 Chronic kidney disease, unspecified: Secondary | ICD-10-CM | POA: Diagnosis not present

## 2018-05-23 DIAGNOSIS — M84422D Pathological fracture, left humerus, subsequent encounter for fracture with routine healing: Secondary | ICD-10-CM | POA: Diagnosis not present

## 2018-05-28 ENCOUNTER — Other Ambulatory Visit: Payer: Self-pay

## 2018-05-28 DIAGNOSIS — I10 Essential (primary) hypertension: Secondary | ICD-10-CM | POA: Diagnosis not present

## 2018-05-28 DIAGNOSIS — E1121 Type 2 diabetes mellitus with diabetic nephropathy: Secondary | ICD-10-CM | POA: Diagnosis not present

## 2018-05-28 DIAGNOSIS — I872 Venous insufficiency (chronic) (peripheral): Secondary | ICD-10-CM | POA: Diagnosis not present

## 2018-05-28 NOTE — Patient Outreach (Signed)
[facility]) Care Management  05/28/2018  ALIHA DIEDRICH 01/01/1924 [id number]   No response from patient after 3 telephone calls and outreach letter attempt   PLAN RNCM will close patient due to being unable to reach.  RNCM will send closure notification to patients primary MD.   Quinn Plowman RN,BSN,CCM [facility] Telephonic  [phone]

## 2018-05-30 DIAGNOSIS — I129 Hypertensive chronic kidney disease with stage 1 through stage 4 chronic kidney disease, or unspecified chronic kidney disease: Secondary | ICD-10-CM | POA: Diagnosis not present

## 2018-05-30 DIAGNOSIS — K219 Gastro-esophageal reflux disease without esophagitis: Secondary | ICD-10-CM | POA: Diagnosis not present

## 2018-05-30 DIAGNOSIS — Z7984 Long term (current) use of oral hypoglycemic drugs: Secondary | ICD-10-CM | POA: Diagnosis not present

## 2018-05-30 DIAGNOSIS — N189 Chronic kidney disease, unspecified: Secondary | ICD-10-CM | POA: Diagnosis not present

## 2018-05-30 DIAGNOSIS — E1122 Type 2 diabetes mellitus with diabetic chronic kidney disease: Secondary | ICD-10-CM | POA: Diagnosis not present

## 2018-05-30 DIAGNOSIS — H409 Unspecified glaucoma: Secondary | ICD-10-CM | POA: Diagnosis not present

## 2018-05-30 DIAGNOSIS — Z85828 Personal history of other malignant neoplasm of skin: Secondary | ICD-10-CM | POA: Diagnosis not present

## 2018-05-30 DIAGNOSIS — M1991 Primary osteoarthritis, unspecified site: Secondary | ICD-10-CM | POA: Diagnosis not present

## 2018-05-30 DIAGNOSIS — J45909 Unspecified asthma, uncomplicated: Secondary | ICD-10-CM | POA: Diagnosis not present

## 2018-05-30 DIAGNOSIS — E039 Hypothyroidism, unspecified: Secondary | ICD-10-CM | POA: Diagnosis not present

## 2018-05-30 DIAGNOSIS — M84422D Pathological fracture, left humerus, subsequent encounter for fracture with routine healing: Secondary | ICD-10-CM | POA: Diagnosis not present

## 2018-05-30 DIAGNOSIS — D631 Anemia in chronic kidney disease: Secondary | ICD-10-CM | POA: Diagnosis not present

## 2018-05-30 DIAGNOSIS — Z96612 Presence of left artificial shoulder joint: Secondary | ICD-10-CM | POA: Diagnosis not present

## 2018-06-04 DIAGNOSIS — I10 Essential (primary) hypertension: Secondary | ICD-10-CM | POA: Diagnosis not present

## 2018-06-04 DIAGNOSIS — R410 Disorientation, unspecified: Secondary | ICD-10-CM | POA: Diagnosis not present

## 2018-06-04 DIAGNOSIS — I872 Venous insufficiency (chronic) (peripheral): Secondary | ICD-10-CM | POA: Diagnosis not present

## 2018-06-05 DIAGNOSIS — K219 Gastro-esophageal reflux disease without esophagitis: Secondary | ICD-10-CM | POA: Diagnosis not present

## 2018-06-05 DIAGNOSIS — E039 Hypothyroidism, unspecified: Secondary | ICD-10-CM | POA: Diagnosis not present

## 2018-06-05 DIAGNOSIS — H409 Unspecified glaucoma: Secondary | ICD-10-CM | POA: Diagnosis not present

## 2018-06-05 DIAGNOSIS — I129 Hypertensive chronic kidney disease with stage 1 through stage 4 chronic kidney disease, or unspecified chronic kidney disease: Secondary | ICD-10-CM | POA: Diagnosis not present

## 2018-06-05 DIAGNOSIS — D631 Anemia in chronic kidney disease: Secondary | ICD-10-CM | POA: Diagnosis not present

## 2018-06-05 DIAGNOSIS — R300 Dysuria: Secondary | ICD-10-CM | POA: Diagnosis not present

## 2018-06-05 DIAGNOSIS — M1991 Primary osteoarthritis, unspecified site: Secondary | ICD-10-CM | POA: Diagnosis not present

## 2018-06-05 DIAGNOSIS — Z85828 Personal history of other malignant neoplasm of skin: Secondary | ICD-10-CM | POA: Diagnosis not present

## 2018-06-05 DIAGNOSIS — E1122 Type 2 diabetes mellitus with diabetic chronic kidney disease: Secondary | ICD-10-CM | POA: Diagnosis not present

## 2018-06-05 DIAGNOSIS — Z7984 Long term (current) use of oral hypoglycemic drugs: Secondary | ICD-10-CM | POA: Diagnosis not present

## 2018-06-05 DIAGNOSIS — N189 Chronic kidney disease, unspecified: Secondary | ICD-10-CM | POA: Diagnosis not present

## 2018-06-05 DIAGNOSIS — M84422D Pathological fracture, left humerus, subsequent encounter for fracture with routine healing: Secondary | ICD-10-CM | POA: Diagnosis not present

## 2018-06-05 DIAGNOSIS — J45909 Unspecified asthma, uncomplicated: Secondary | ICD-10-CM | POA: Diagnosis not present

## 2018-06-05 DIAGNOSIS — Z96612 Presence of left artificial shoulder joint: Secondary | ICD-10-CM | POA: Diagnosis not present

## 2018-07-02 DIAGNOSIS — I872 Venous insufficiency (chronic) (peripheral): Secondary | ICD-10-CM | POA: Diagnosis not present

## 2018-07-02 DIAGNOSIS — E119 Type 2 diabetes mellitus without complications: Secondary | ICD-10-CM | POA: Diagnosis not present

## 2018-07-02 DIAGNOSIS — N3000 Acute cystitis without hematuria: Secondary | ICD-10-CM | POA: Diagnosis not present

## 2018-09-17 DIAGNOSIS — R4189 Other symptoms and signs involving cognitive functions and awareness: Secondary | ICD-10-CM | POA: Diagnosis not present

## 2018-09-17 DIAGNOSIS — K219 Gastro-esophageal reflux disease without esophagitis: Secondary | ICD-10-CM | POA: Diagnosis not present

## 2018-09-17 DIAGNOSIS — R600 Localized edema: Secondary | ICD-10-CM | POA: Diagnosis not present

## 2018-09-17 DIAGNOSIS — L89151 Pressure ulcer of sacral region, stage 1: Secondary | ICD-10-CM | POA: Diagnosis not present

## 2018-09-25 DIAGNOSIS — Z79899 Other long term (current) drug therapy: Secondary | ICD-10-CM | POA: Diagnosis not present

## 2018-09-25 DIAGNOSIS — N189 Chronic kidney disease, unspecified: Secondary | ICD-10-CM | POA: Diagnosis not present

## 2018-10-09 DIAGNOSIS — R410 Disorientation, unspecified: Secondary | ICD-10-CM | POA: Diagnosis not present

## 2018-10-12 DIAGNOSIS — Z23 Encounter for immunization: Secondary | ICD-10-CM | POA: Diagnosis not present

## 2018-11-12 DIAGNOSIS — I129 Hypertensive chronic kidney disease with stage 1 through stage 4 chronic kidney disease, or unspecified chronic kidney disease: Secondary | ICD-10-CM | POA: Diagnosis not present

## 2018-11-12 DIAGNOSIS — E1121 Type 2 diabetes mellitus with diabetic nephropathy: Secondary | ICD-10-CM | POA: Diagnosis not present

## 2018-11-12 DIAGNOSIS — N183 Chronic kidney disease, stage 3 (moderate): Secondary | ICD-10-CM | POA: Diagnosis not present

## 2018-11-12 DIAGNOSIS — D649 Anemia, unspecified: Secondary | ICD-10-CM | POA: Diagnosis not present

## 2018-11-20 DIAGNOSIS — Z79899 Other long term (current) drug therapy: Secondary | ICD-10-CM | POA: Diagnosis not present

## 2018-11-20 DIAGNOSIS — E559 Vitamin D deficiency, unspecified: Secondary | ICD-10-CM | POA: Diagnosis not present

## 2019-01-03 DIAGNOSIS — E039 Hypothyroidism, unspecified: Secondary | ICD-10-CM | POA: Diagnosis not present

## 2019-01-09 DIAGNOSIS — Z79899 Other long term (current) drug therapy: Secondary | ICD-10-CM | POA: Diagnosis not present

## 2019-02-19 DIAGNOSIS — Z79899 Other long term (current) drug therapy: Secondary | ICD-10-CM | POA: Diagnosis not present

## 2019-03-25 DIAGNOSIS — L89312 Pressure ulcer of right buttock, stage 2: Secondary | ICD-10-CM | POA: Diagnosis not present

## 2019-03-25 DIAGNOSIS — I10 Essential (primary) hypertension: Secondary | ICD-10-CM | POA: Diagnosis not present

## 2019-03-25 DIAGNOSIS — K219 Gastro-esophageal reflux disease without esophagitis: Secondary | ICD-10-CM | POA: Diagnosis not present

## 2019-03-25 DIAGNOSIS — Z79899 Other long term (current) drug therapy: Secondary | ICD-10-CM | POA: Diagnosis not present

## 2019-03-25 DIAGNOSIS — E1121 Type 2 diabetes mellitus with diabetic nephropathy: Secondary | ICD-10-CM | POA: Diagnosis not present

## 2019-03-25 DIAGNOSIS — L89323 Pressure ulcer of left buttock, stage 3: Secondary | ICD-10-CM | POA: Diagnosis not present

## 2019-03-26 DIAGNOSIS — Z79899 Other long term (current) drug therapy: Secondary | ICD-10-CM | POA: Diagnosis not present

## 2019-03-26 DIAGNOSIS — E1121 Type 2 diabetes mellitus with diabetic nephropathy: Secondary | ICD-10-CM | POA: Diagnosis not present

## 2019-04-01 DIAGNOSIS — Z136 Encounter for screening for cardiovascular disorders: Secondary | ICD-10-CM | POA: Diagnosis not present

## 2019-04-01 DIAGNOSIS — I10 Essential (primary) hypertension: Secondary | ICD-10-CM | POA: Diagnosis not present

## 2019-04-01 DIAGNOSIS — L03115 Cellulitis of right lower limb: Secondary | ICD-10-CM | POA: Diagnosis not present

## 2019-04-01 DIAGNOSIS — M7989 Other specified soft tissue disorders: Secondary | ICD-10-CM | POA: Diagnosis not present

## 2019-04-01 DIAGNOSIS — E1121 Type 2 diabetes mellitus with diabetic nephropathy: Secondary | ICD-10-CM | POA: Diagnosis not present

## 2019-04-01 DIAGNOSIS — M79604 Pain in right leg: Secondary | ICD-10-CM | POA: Diagnosis not present

## 2019-04-23 DIAGNOSIS — Z139 Encounter for screening, unspecified: Secondary | ICD-10-CM | POA: Diagnosis not present

## 2019-07-19 DIAGNOSIS — Z20828 Contact with and (suspected) exposure to other viral communicable diseases: Secondary | ICD-10-CM | POA: Diagnosis not present

## 2019-07-20 DIAGNOSIS — E039 Hypothyroidism, unspecified: Secondary | ICD-10-CM | POA: Diagnosis not present

## 2019-07-20 DIAGNOSIS — Z79899 Other long term (current) drug therapy: Secondary | ICD-10-CM | POA: Diagnosis not present

## 2019-07-20 DIAGNOSIS — E1121 Type 2 diabetes mellitus with diabetic nephropathy: Secondary | ICD-10-CM | POA: Diagnosis not present

## 2019-07-30 DIAGNOSIS — Z79899 Other long term (current) drug therapy: Secondary | ICD-10-CM | POA: Diagnosis not present

## 2019-07-30 DIAGNOSIS — E039 Hypothyroidism, unspecified: Secondary | ICD-10-CM | POA: Diagnosis not present

## 2019-07-30 DIAGNOSIS — E1121 Type 2 diabetes mellitus with diabetic nephropathy: Secondary | ICD-10-CM | POA: Diagnosis not present

## 2019-08-05 DIAGNOSIS — R310 Gross hematuria: Secondary | ICD-10-CM | POA: Diagnosis not present

## 2019-08-05 DIAGNOSIS — E1121 Type 2 diabetes mellitus with diabetic nephropathy: Secondary | ICD-10-CM | POA: Diagnosis not present

## 2019-08-05 DIAGNOSIS — N183 Chronic kidney disease, stage 3 (moderate): Secondary | ICD-10-CM | POA: Diagnosis not present

## 2019-08-05 DIAGNOSIS — D649 Anemia, unspecified: Secondary | ICD-10-CM | POA: Diagnosis not present

## 2019-08-05 DIAGNOSIS — I129 Hypertensive chronic kidney disease with stage 1 through stage 4 chronic kidney disease, or unspecified chronic kidney disease: Secondary | ICD-10-CM | POA: Diagnosis not present

## 2019-08-05 DIAGNOSIS — E039 Hypothyroidism, unspecified: Secondary | ICD-10-CM | POA: Diagnosis not present

## 2019-08-06 DIAGNOSIS — Z79899 Other long term (current) drug therapy: Secondary | ICD-10-CM | POA: Diagnosis not present

## 2019-08-06 DIAGNOSIS — N390 Urinary tract infection, site not specified: Secondary | ICD-10-CM | POA: Diagnosis not present

## 2019-08-15 DIAGNOSIS — Z20828 Contact with and (suspected) exposure to other viral communicable diseases: Secondary | ICD-10-CM | POA: Diagnosis not present

## 2019-08-16 DIAGNOSIS — Z20828 Contact with and (suspected) exposure to other viral communicable diseases: Secondary | ICD-10-CM | POA: Diagnosis not present

## 2019-11-19 DIAGNOSIS — I10 Essential (primary) hypertension: Secondary | ICD-10-CM | POA: Diagnosis not present

## 2019-11-19 DIAGNOSIS — E1121 Type 2 diabetes mellitus with diabetic nephropathy: Secondary | ICD-10-CM | POA: Diagnosis not present

## 2019-11-19 DIAGNOSIS — E559 Vitamin D deficiency, unspecified: Secondary | ICD-10-CM | POA: Diagnosis not present

## 2020-01-13 DIAGNOSIS — G8222 Paraplegia, incomplete: Secondary | ICD-10-CM | POA: Diagnosis not present

## 2020-01-13 DIAGNOSIS — E1121 Type 2 diabetes mellitus with diabetic nephropathy: Secondary | ICD-10-CM | POA: Diagnosis not present

## 2020-01-13 DIAGNOSIS — R32 Unspecified urinary incontinence: Secondary | ICD-10-CM | POA: Diagnosis not present

## 2020-01-13 DIAGNOSIS — Z993 Dependence on wheelchair: Secondary | ICD-10-CM | POA: Diagnosis not present

## 2020-01-13 DIAGNOSIS — E039 Hypothyroidism, unspecified: Secondary | ICD-10-CM | POA: Diagnosis not present

## 2020-01-28 DIAGNOSIS — E039 Hypothyroidism, unspecified: Secondary | ICD-10-CM | POA: Diagnosis not present

## 2020-01-28 DIAGNOSIS — N390 Urinary tract infection, site not specified: Secondary | ICD-10-CM | POA: Diagnosis not present

## 2020-01-28 DIAGNOSIS — Z20828 Contact with and (suspected) exposure to other viral communicable diseases: Secondary | ICD-10-CM | POA: Diagnosis not present

## 2020-03-24 DIAGNOSIS — E039 Hypothyroidism, unspecified: Secondary | ICD-10-CM | POA: Diagnosis not present

## 2020-03-24 DIAGNOSIS — I10 Essential (primary) hypertension: Secondary | ICD-10-CM | POA: Diagnosis not present

## 2020-03-24 DIAGNOSIS — E1121 Type 2 diabetes mellitus with diabetic nephropathy: Secondary | ICD-10-CM | POA: Diagnosis not present

## 2020-03-24 DIAGNOSIS — Z79899 Other long term (current) drug therapy: Secondary | ICD-10-CM | POA: Diagnosis not present

## 2020-05-21 DIAGNOSIS — E1121 Type 2 diabetes mellitus with diabetic nephropathy: Secondary | ICD-10-CM | POA: Diagnosis not present

## 2020-05-21 DIAGNOSIS — R410 Disorientation, unspecified: Secondary | ICD-10-CM | POA: Diagnosis not present

## 2020-05-21 DIAGNOSIS — I10 Essential (primary) hypertension: Secondary | ICD-10-CM | POA: Diagnosis not present

## 2020-05-21 DIAGNOSIS — E039 Hypothyroidism, unspecified: Secondary | ICD-10-CM | POA: Diagnosis not present

## 2020-05-21 DIAGNOSIS — Z7984 Long term (current) use of oral hypoglycemic drugs: Secondary | ICD-10-CM | POA: Diagnosis not present

## 2020-05-21 DIAGNOSIS — Z79899 Other long term (current) drug therapy: Secondary | ICD-10-CM | POA: Diagnosis not present

## 2020-05-21 DIAGNOSIS — N390 Urinary tract infection, site not specified: Secondary | ICD-10-CM | POA: Diagnosis not present

## 2020-05-28 DIAGNOSIS — I10 Essential (primary) hypertension: Secondary | ICD-10-CM | POA: Diagnosis not present

## 2020-05-28 DIAGNOSIS — Z7984 Long term (current) use of oral hypoglycemic drugs: Secondary | ICD-10-CM | POA: Diagnosis not present

## 2020-05-28 DIAGNOSIS — E1121 Type 2 diabetes mellitus with diabetic nephropathy: Secondary | ICD-10-CM | POA: Diagnosis not present

## 2020-05-28 DIAGNOSIS — E039 Hypothyroidism, unspecified: Secondary | ICD-10-CM | POA: Diagnosis not present

## 2020-05-28 DIAGNOSIS — N390 Urinary tract infection, site not specified: Secondary | ICD-10-CM | POA: Diagnosis not present

## 2020-05-29 DIAGNOSIS — Z79899 Other long term (current) drug therapy: Secondary | ICD-10-CM | POA: Diagnosis not present

## 2020-05-29 DIAGNOSIS — N390 Urinary tract infection, site not specified: Secondary | ICD-10-CM | POA: Diagnosis not present

## 2020-06-11 DIAGNOSIS — E039 Hypothyroidism, unspecified: Secondary | ICD-10-CM | POA: Diagnosis not present

## 2020-06-11 DIAGNOSIS — L989 Disorder of the skin and subcutaneous tissue, unspecified: Secondary | ICD-10-CM | POA: Diagnosis not present

## 2020-06-11 DIAGNOSIS — S31000A Unspecified open wound of lower back and pelvis without penetration into retroperitoneum, initial encounter: Secondary | ICD-10-CM | POA: Diagnosis not present

## 2020-06-11 DIAGNOSIS — I10 Essential (primary) hypertension: Secondary | ICD-10-CM | POA: Diagnosis not present

## 2020-06-16 DIAGNOSIS — E039 Hypothyroidism, unspecified: Secondary | ICD-10-CM | POA: Diagnosis not present

## 2020-06-23 DIAGNOSIS — D485 Neoplasm of uncertain behavior of skin: Secondary | ICD-10-CM | POA: Diagnosis not present

## 2020-06-23 DIAGNOSIS — C44319 Basal cell carcinoma of skin of other parts of face: Secondary | ICD-10-CM | POA: Diagnosis not present

## 2020-06-24 DIAGNOSIS — Z85828 Personal history of other malignant neoplasm of skin: Secondary | ICD-10-CM | POA: Diagnosis not present

## 2020-06-24 DIAGNOSIS — Z7984 Long term (current) use of oral hypoglycemic drugs: Secondary | ICD-10-CM | POA: Diagnosis not present

## 2020-06-24 DIAGNOSIS — I110 Hypertensive heart disease with heart failure: Secondary | ICD-10-CM | POA: Diagnosis not present

## 2020-06-24 DIAGNOSIS — H539 Unspecified visual disturbance: Secondary | ICD-10-CM | POA: Diagnosis not present

## 2020-06-24 DIAGNOSIS — I5030 Unspecified diastolic (congestive) heart failure: Secondary | ICD-10-CM | POA: Diagnosis not present

## 2020-06-24 DIAGNOSIS — J45909 Unspecified asthma, uncomplicated: Secondary | ICD-10-CM | POA: Diagnosis not present

## 2020-06-24 DIAGNOSIS — K219 Gastro-esophageal reflux disease without esophagitis: Secondary | ICD-10-CM | POA: Diagnosis not present

## 2020-06-24 DIAGNOSIS — H409 Unspecified glaucoma: Secondary | ICD-10-CM | POA: Diagnosis not present

## 2020-06-24 DIAGNOSIS — L989 Disorder of the skin and subcutaneous tissue, unspecified: Secondary | ICD-10-CM | POA: Diagnosis not present

## 2020-06-24 DIAGNOSIS — E1151 Type 2 diabetes mellitus with diabetic peripheral angiopathy without gangrene: Secondary | ICD-10-CM | POA: Diagnosis not present

## 2020-06-24 DIAGNOSIS — E1122 Type 2 diabetes mellitus with diabetic chronic kidney disease: Secondary | ICD-10-CM | POA: Diagnosis not present

## 2020-06-24 DIAGNOSIS — L89152 Pressure ulcer of sacral region, stage 2: Secondary | ICD-10-CM | POA: Diagnosis not present

## 2020-06-24 DIAGNOSIS — D631 Anemia in chronic kidney disease: Secondary | ICD-10-CM | POA: Diagnosis not present

## 2020-06-24 DIAGNOSIS — N189 Chronic kidney disease, unspecified: Secondary | ICD-10-CM | POA: Diagnosis not present

## 2020-06-24 DIAGNOSIS — F0280 Dementia in other diseases classified elsewhere without behavioral disturbance: Secondary | ICD-10-CM | POA: Diagnosis not present

## 2020-06-24 DIAGNOSIS — I872 Venous insufficiency (chronic) (peripheral): Secondary | ICD-10-CM | POA: Diagnosis not present

## 2020-06-24 DIAGNOSIS — M1990 Unspecified osteoarthritis, unspecified site: Secondary | ICD-10-CM | POA: Diagnosis not present

## 2020-06-24 DIAGNOSIS — K222 Esophageal obstruction: Secondary | ICD-10-CM | POA: Diagnosis not present

## 2020-06-24 DIAGNOSIS — E039 Hypothyroidism, unspecified: Secondary | ICD-10-CM | POA: Diagnosis not present

## 2020-07-02 DIAGNOSIS — E039 Hypothyroidism, unspecified: Secondary | ICD-10-CM | POA: Diagnosis not present

## 2020-07-02 DIAGNOSIS — I10 Essential (primary) hypertension: Secondary | ICD-10-CM | POA: Diagnosis not present

## 2020-07-02 DIAGNOSIS — E1121 Type 2 diabetes mellitus with diabetic nephropathy: Secondary | ICD-10-CM | POA: Diagnosis not present

## 2020-07-07 DIAGNOSIS — M1990 Unspecified osteoarthritis, unspecified site: Secondary | ICD-10-CM | POA: Diagnosis not present

## 2020-07-07 DIAGNOSIS — K222 Esophageal obstruction: Secondary | ICD-10-CM | POA: Diagnosis not present

## 2020-07-07 DIAGNOSIS — E1151 Type 2 diabetes mellitus with diabetic peripheral angiopathy without gangrene: Secondary | ICD-10-CM | POA: Diagnosis not present

## 2020-07-07 DIAGNOSIS — L89152 Pressure ulcer of sacral region, stage 2: Secondary | ICD-10-CM | POA: Diagnosis not present

## 2020-07-07 DIAGNOSIS — F0280 Dementia in other diseases classified elsewhere without behavioral disturbance: Secondary | ICD-10-CM | POA: Diagnosis not present

## 2020-07-07 DIAGNOSIS — N189 Chronic kidney disease, unspecified: Secondary | ICD-10-CM | POA: Diagnosis not present

## 2020-07-07 DIAGNOSIS — D631 Anemia in chronic kidney disease: Secondary | ICD-10-CM | POA: Diagnosis not present

## 2020-07-07 DIAGNOSIS — L989 Disorder of the skin and subcutaneous tissue, unspecified: Secondary | ICD-10-CM | POA: Diagnosis not present

## 2020-07-07 DIAGNOSIS — Z85828 Personal history of other malignant neoplasm of skin: Secondary | ICD-10-CM | POA: Diagnosis not present

## 2020-07-07 DIAGNOSIS — H409 Unspecified glaucoma: Secondary | ICD-10-CM | POA: Diagnosis not present

## 2020-07-07 DIAGNOSIS — I110 Hypertensive heart disease with heart failure: Secondary | ICD-10-CM | POA: Diagnosis not present

## 2020-07-07 DIAGNOSIS — K219 Gastro-esophageal reflux disease without esophagitis: Secondary | ICD-10-CM | POA: Diagnosis not present

## 2020-07-07 DIAGNOSIS — I872 Venous insufficiency (chronic) (peripheral): Secondary | ICD-10-CM | POA: Diagnosis not present

## 2020-07-07 DIAGNOSIS — J45909 Unspecified asthma, uncomplicated: Secondary | ICD-10-CM | POA: Diagnosis not present

## 2020-07-07 DIAGNOSIS — I5030 Unspecified diastolic (congestive) heart failure: Secondary | ICD-10-CM | POA: Diagnosis not present

## 2020-07-07 DIAGNOSIS — E039 Hypothyroidism, unspecified: Secondary | ICD-10-CM | POA: Diagnosis not present

## 2020-07-07 DIAGNOSIS — E1122 Type 2 diabetes mellitus with diabetic chronic kidney disease: Secondary | ICD-10-CM | POA: Diagnosis not present

## 2020-07-07 DIAGNOSIS — Z7984 Long term (current) use of oral hypoglycemic drugs: Secondary | ICD-10-CM | POA: Diagnosis not present

## 2020-07-07 DIAGNOSIS — H539 Unspecified visual disturbance: Secondary | ICD-10-CM | POA: Diagnosis not present

## 2020-07-17 DIAGNOSIS — F0390 Unspecified dementia without behavioral disturbance: Secondary | ICD-10-CM | POA: Diagnosis not present

## 2020-07-17 DIAGNOSIS — I10 Essential (primary) hypertension: Secondary | ICD-10-CM | POA: Diagnosis not present

## 2020-07-17 DIAGNOSIS — W19XXXA Unspecified fall, initial encounter: Secondary | ICD-10-CM | POA: Diagnosis not present

## 2020-07-22 DIAGNOSIS — K222 Esophageal obstruction: Secondary | ICD-10-CM | POA: Diagnosis not present

## 2020-07-22 DIAGNOSIS — I872 Venous insufficiency (chronic) (peripheral): Secondary | ICD-10-CM | POA: Diagnosis not present

## 2020-07-22 DIAGNOSIS — Z85828 Personal history of other malignant neoplasm of skin: Secondary | ICD-10-CM | POA: Diagnosis not present

## 2020-07-22 DIAGNOSIS — E1122 Type 2 diabetes mellitus with diabetic chronic kidney disease: Secondary | ICD-10-CM | POA: Diagnosis not present

## 2020-07-22 DIAGNOSIS — E039 Hypothyroidism, unspecified: Secondary | ICD-10-CM | POA: Diagnosis not present

## 2020-07-22 DIAGNOSIS — K219 Gastro-esophageal reflux disease without esophagitis: Secondary | ICD-10-CM | POA: Diagnosis not present

## 2020-07-22 DIAGNOSIS — L89152 Pressure ulcer of sacral region, stage 2: Secondary | ICD-10-CM | POA: Diagnosis not present

## 2020-07-22 DIAGNOSIS — I5030 Unspecified diastolic (congestive) heart failure: Secondary | ICD-10-CM | POA: Diagnosis not present

## 2020-07-22 DIAGNOSIS — H539 Unspecified visual disturbance: Secondary | ICD-10-CM | POA: Diagnosis not present

## 2020-07-22 DIAGNOSIS — N189 Chronic kidney disease, unspecified: Secondary | ICD-10-CM | POA: Diagnosis not present

## 2020-07-22 DIAGNOSIS — F0280 Dementia in other diseases classified elsewhere without behavioral disturbance: Secondary | ICD-10-CM | POA: Diagnosis not present

## 2020-07-22 DIAGNOSIS — J45909 Unspecified asthma, uncomplicated: Secondary | ICD-10-CM | POA: Diagnosis not present

## 2020-07-22 DIAGNOSIS — L989 Disorder of the skin and subcutaneous tissue, unspecified: Secondary | ICD-10-CM | POA: Diagnosis not present

## 2020-07-22 DIAGNOSIS — H409 Unspecified glaucoma: Secondary | ICD-10-CM | POA: Diagnosis not present

## 2020-07-22 DIAGNOSIS — Z7984 Long term (current) use of oral hypoglycemic drugs: Secondary | ICD-10-CM | POA: Diagnosis not present

## 2020-07-22 DIAGNOSIS — M1990 Unspecified osteoarthritis, unspecified site: Secondary | ICD-10-CM | POA: Diagnosis not present

## 2020-07-22 DIAGNOSIS — I110 Hypertensive heart disease with heart failure: Secondary | ICD-10-CM | POA: Diagnosis not present

## 2020-07-22 DIAGNOSIS — D631 Anemia in chronic kidney disease: Secondary | ICD-10-CM | POA: Diagnosis not present

## 2020-07-22 DIAGNOSIS — E1151 Type 2 diabetes mellitus with diabetic peripheral angiopathy without gangrene: Secondary | ICD-10-CM | POA: Diagnosis not present

## 2020-08-06 DIAGNOSIS — E039 Hypothyroidism, unspecified: Secondary | ICD-10-CM | POA: Diagnosis not present

## 2020-08-06 DIAGNOSIS — H539 Unspecified visual disturbance: Secondary | ICD-10-CM | POA: Diagnosis not present

## 2020-08-06 DIAGNOSIS — F0280 Dementia in other diseases classified elsewhere without behavioral disturbance: Secondary | ICD-10-CM | POA: Diagnosis not present

## 2020-08-06 DIAGNOSIS — L989 Disorder of the skin and subcutaneous tissue, unspecified: Secondary | ICD-10-CM | POA: Diagnosis not present

## 2020-08-06 DIAGNOSIS — I5030 Unspecified diastolic (congestive) heart failure: Secondary | ICD-10-CM | POA: Diagnosis not present

## 2020-08-06 DIAGNOSIS — Z85828 Personal history of other malignant neoplasm of skin: Secondary | ICD-10-CM | POA: Diagnosis not present

## 2020-08-06 DIAGNOSIS — H409 Unspecified glaucoma: Secondary | ICD-10-CM | POA: Diagnosis not present

## 2020-08-06 DIAGNOSIS — I872 Venous insufficiency (chronic) (peripheral): Secondary | ICD-10-CM | POA: Diagnosis not present

## 2020-08-06 DIAGNOSIS — Z7984 Long term (current) use of oral hypoglycemic drugs: Secondary | ICD-10-CM | POA: Diagnosis not present

## 2020-08-06 DIAGNOSIS — J45909 Unspecified asthma, uncomplicated: Secondary | ICD-10-CM | POA: Diagnosis not present

## 2020-08-06 DIAGNOSIS — D631 Anemia in chronic kidney disease: Secondary | ICD-10-CM | POA: Diagnosis not present

## 2020-08-06 DIAGNOSIS — E1122 Type 2 diabetes mellitus with diabetic chronic kidney disease: Secondary | ICD-10-CM | POA: Diagnosis not present

## 2020-08-06 DIAGNOSIS — K219 Gastro-esophageal reflux disease without esophagitis: Secondary | ICD-10-CM | POA: Diagnosis not present

## 2020-08-06 DIAGNOSIS — K222 Esophageal obstruction: Secondary | ICD-10-CM | POA: Diagnosis not present

## 2020-08-06 DIAGNOSIS — L89152 Pressure ulcer of sacral region, stage 2: Secondary | ICD-10-CM | POA: Diagnosis not present

## 2020-08-06 DIAGNOSIS — M1990 Unspecified osteoarthritis, unspecified site: Secondary | ICD-10-CM | POA: Diagnosis not present

## 2020-08-06 DIAGNOSIS — I110 Hypertensive heart disease with heart failure: Secondary | ICD-10-CM | POA: Diagnosis not present

## 2020-08-06 DIAGNOSIS — E1151 Type 2 diabetes mellitus with diabetic peripheral angiopathy without gangrene: Secondary | ICD-10-CM | POA: Diagnosis not present

## 2020-08-06 DIAGNOSIS — N189 Chronic kidney disease, unspecified: Secondary | ICD-10-CM | POA: Diagnosis not present

## 2020-08-21 DIAGNOSIS — F0280 Dementia in other diseases classified elsewhere without behavioral disturbance: Secondary | ICD-10-CM | POA: Diagnosis not present

## 2020-08-21 DIAGNOSIS — E039 Hypothyroidism, unspecified: Secondary | ICD-10-CM | POA: Diagnosis not present

## 2020-08-21 DIAGNOSIS — N189 Chronic kidney disease, unspecified: Secondary | ICD-10-CM | POA: Diagnosis not present

## 2020-08-21 DIAGNOSIS — D631 Anemia in chronic kidney disease: Secondary | ICD-10-CM | POA: Diagnosis not present

## 2020-08-21 DIAGNOSIS — K219 Gastro-esophageal reflux disease without esophagitis: Secondary | ICD-10-CM | POA: Diagnosis not present

## 2020-08-21 DIAGNOSIS — I872 Venous insufficiency (chronic) (peripheral): Secondary | ICD-10-CM | POA: Diagnosis not present

## 2020-08-21 DIAGNOSIS — J45909 Unspecified asthma, uncomplicated: Secondary | ICD-10-CM | POA: Diagnosis not present

## 2020-08-21 DIAGNOSIS — K222 Esophageal obstruction: Secondary | ICD-10-CM | POA: Diagnosis not present

## 2020-08-21 DIAGNOSIS — I5030 Unspecified diastolic (congestive) heart failure: Secondary | ICD-10-CM | POA: Diagnosis not present

## 2020-08-21 DIAGNOSIS — I110 Hypertensive heart disease with heart failure: Secondary | ICD-10-CM | POA: Diagnosis not present

## 2020-08-21 DIAGNOSIS — H409 Unspecified glaucoma: Secondary | ICD-10-CM | POA: Diagnosis not present

## 2020-08-21 DIAGNOSIS — Z85828 Personal history of other malignant neoplasm of skin: Secondary | ICD-10-CM | POA: Diagnosis not present

## 2020-08-21 DIAGNOSIS — H539 Unspecified visual disturbance: Secondary | ICD-10-CM | POA: Diagnosis not present

## 2020-08-21 DIAGNOSIS — M1990 Unspecified osteoarthritis, unspecified site: Secondary | ICD-10-CM | POA: Diagnosis not present

## 2020-08-21 DIAGNOSIS — E1151 Type 2 diabetes mellitus with diabetic peripheral angiopathy without gangrene: Secondary | ICD-10-CM | POA: Diagnosis not present

## 2020-08-21 DIAGNOSIS — L989 Disorder of the skin and subcutaneous tissue, unspecified: Secondary | ICD-10-CM | POA: Diagnosis not present

## 2020-08-21 DIAGNOSIS — E1122 Type 2 diabetes mellitus with diabetic chronic kidney disease: Secondary | ICD-10-CM | POA: Diagnosis not present

## 2020-08-21 DIAGNOSIS — L89152 Pressure ulcer of sacral region, stage 2: Secondary | ICD-10-CM | POA: Diagnosis not present

## 2020-08-21 DIAGNOSIS — Z7984 Long term (current) use of oral hypoglycemic drugs: Secondary | ICD-10-CM | POA: Diagnosis not present

## 2020-08-25 DIAGNOSIS — M1990 Unspecified osteoarthritis, unspecified site: Secondary | ICD-10-CM | POA: Diagnosis not present

## 2020-08-25 DIAGNOSIS — E039 Hypothyroidism, unspecified: Secondary | ICD-10-CM | POA: Diagnosis not present

## 2020-08-25 DIAGNOSIS — Z7984 Long term (current) use of oral hypoglycemic drugs: Secondary | ICD-10-CM | POA: Diagnosis not present

## 2020-08-25 DIAGNOSIS — L89152 Pressure ulcer of sacral region, stage 2: Secondary | ICD-10-CM | POA: Diagnosis not present

## 2020-08-25 DIAGNOSIS — H539 Unspecified visual disturbance: Secondary | ICD-10-CM | POA: Diagnosis not present

## 2020-08-25 DIAGNOSIS — K219 Gastro-esophageal reflux disease without esophagitis: Secondary | ICD-10-CM | POA: Diagnosis not present

## 2020-08-25 DIAGNOSIS — I872 Venous insufficiency (chronic) (peripheral): Secondary | ICD-10-CM | POA: Diagnosis not present

## 2020-08-25 DIAGNOSIS — N189 Chronic kidney disease, unspecified: Secondary | ICD-10-CM | POA: Diagnosis not present

## 2020-08-25 DIAGNOSIS — E1151 Type 2 diabetes mellitus with diabetic peripheral angiopathy without gangrene: Secondary | ICD-10-CM | POA: Diagnosis not present

## 2020-08-25 DIAGNOSIS — Z85828 Personal history of other malignant neoplasm of skin: Secondary | ICD-10-CM | POA: Diagnosis not present

## 2020-08-25 DIAGNOSIS — F0280 Dementia in other diseases classified elsewhere without behavioral disturbance: Secondary | ICD-10-CM | POA: Diagnosis not present

## 2020-08-25 DIAGNOSIS — I5030 Unspecified diastolic (congestive) heart failure: Secondary | ICD-10-CM | POA: Diagnosis not present

## 2020-08-25 DIAGNOSIS — H409 Unspecified glaucoma: Secondary | ICD-10-CM | POA: Diagnosis not present

## 2020-08-25 DIAGNOSIS — K222 Esophageal obstruction: Secondary | ICD-10-CM | POA: Diagnosis not present

## 2020-08-25 DIAGNOSIS — I110 Hypertensive heart disease with heart failure: Secondary | ICD-10-CM | POA: Diagnosis not present

## 2020-08-25 DIAGNOSIS — L989 Disorder of the skin and subcutaneous tissue, unspecified: Secondary | ICD-10-CM | POA: Diagnosis not present

## 2020-08-25 DIAGNOSIS — L89322 Pressure ulcer of left buttock, stage 2: Secondary | ICD-10-CM | POA: Diagnosis not present

## 2020-08-25 DIAGNOSIS — D631 Anemia in chronic kidney disease: Secondary | ICD-10-CM | POA: Diagnosis not present

## 2020-08-25 DIAGNOSIS — E1122 Type 2 diabetes mellitus with diabetic chronic kidney disease: Secondary | ICD-10-CM | POA: Diagnosis not present

## 2020-08-25 DIAGNOSIS — J45909 Unspecified asthma, uncomplicated: Secondary | ICD-10-CM | POA: Diagnosis not present

## 2020-09-07 DIAGNOSIS — E1121 Type 2 diabetes mellitus with diabetic nephropathy: Secondary | ICD-10-CM | POA: Diagnosis not present

## 2020-09-07 DIAGNOSIS — N183 Chronic kidney disease, stage 3 unspecified: Secondary | ICD-10-CM | POA: Diagnosis not present

## 2020-09-07 DIAGNOSIS — L89152 Pressure ulcer of sacral region, stage 2: Secondary | ICD-10-CM | POA: Diagnosis not present

## 2020-09-07 DIAGNOSIS — M170 Bilateral primary osteoarthritis of knee: Secondary | ICD-10-CM | POA: Diagnosis not present

## 2020-09-07 DIAGNOSIS — G8222 Paraplegia, incomplete: Secondary | ICD-10-CM | POA: Diagnosis not present

## 2020-09-07 DIAGNOSIS — E039 Hypothyroidism, unspecified: Secondary | ICD-10-CM | POA: Diagnosis not present

## 2020-09-25 DIAGNOSIS — L89322 Pressure ulcer of left buttock, stage 2: Secondary | ICD-10-CM | POA: Diagnosis not present

## 2020-09-25 DIAGNOSIS — I110 Hypertensive heart disease with heart failure: Secondary | ICD-10-CM | POA: Diagnosis not present

## 2020-09-25 DIAGNOSIS — L89152 Pressure ulcer of sacral region, stage 2: Secondary | ICD-10-CM | POA: Diagnosis not present

## 2020-10-26 DIAGNOSIS — H539 Unspecified visual disturbance: Secondary | ICD-10-CM | POA: Diagnosis not present

## 2020-10-26 DIAGNOSIS — I5030 Unspecified diastolic (congestive) heart failure: Secondary | ICD-10-CM | POA: Diagnosis not present

## 2020-10-26 DIAGNOSIS — I110 Hypertensive heart disease with heart failure: Secondary | ICD-10-CM | POA: Diagnosis not present

## 2020-10-26 DIAGNOSIS — E1151 Type 2 diabetes mellitus with diabetic peripheral angiopathy without gangrene: Secondary | ICD-10-CM | POA: Diagnosis not present

## 2020-10-26 DIAGNOSIS — J45909 Unspecified asthma, uncomplicated: Secondary | ICD-10-CM | POA: Diagnosis not present

## 2020-10-26 DIAGNOSIS — Z85828 Personal history of other malignant neoplasm of skin: Secondary | ICD-10-CM | POA: Diagnosis not present

## 2020-10-26 DIAGNOSIS — E1122 Type 2 diabetes mellitus with diabetic chronic kidney disease: Secondary | ICD-10-CM | POA: Diagnosis not present

## 2020-10-26 DIAGNOSIS — M1990 Unspecified osteoarthritis, unspecified site: Secondary | ICD-10-CM | POA: Diagnosis not present

## 2020-10-26 DIAGNOSIS — E039 Hypothyroidism, unspecified: Secondary | ICD-10-CM | POA: Diagnosis not present

## 2020-10-26 DIAGNOSIS — Z7984 Long term (current) use of oral hypoglycemic drugs: Secondary | ICD-10-CM | POA: Diagnosis not present

## 2020-10-26 DIAGNOSIS — D631 Anemia in chronic kidney disease: Secondary | ICD-10-CM | POA: Diagnosis not present

## 2020-10-26 DIAGNOSIS — K219 Gastro-esophageal reflux disease without esophagitis: Secondary | ICD-10-CM | POA: Diagnosis not present

## 2020-10-26 DIAGNOSIS — N189 Chronic kidney disease, unspecified: Secondary | ICD-10-CM | POA: Diagnosis not present

## 2020-10-26 DIAGNOSIS — L89152 Pressure ulcer of sacral region, stage 2: Secondary | ICD-10-CM | POA: Diagnosis not present

## 2020-10-26 DIAGNOSIS — I872 Venous insufficiency (chronic) (peripheral): Secondary | ICD-10-CM | POA: Diagnosis not present

## 2020-10-26 DIAGNOSIS — K222 Esophageal obstruction: Secondary | ICD-10-CM | POA: Diagnosis not present

## 2020-10-26 DIAGNOSIS — H409 Unspecified glaucoma: Secondary | ICD-10-CM | POA: Diagnosis not present

## 2020-10-26 DIAGNOSIS — F0280 Dementia in other diseases classified elsewhere without behavioral disturbance: Secondary | ICD-10-CM | POA: Diagnosis not present

## 2020-11-04 DIAGNOSIS — N390 Urinary tract infection, site not specified: Secondary | ICD-10-CM | POA: Diagnosis not present

## 2020-11-18 DIAGNOSIS — L89152 Pressure ulcer of sacral region, stage 2: Secondary | ICD-10-CM | POA: Diagnosis not present

## 2020-11-18 DIAGNOSIS — I110 Hypertensive heart disease with heart failure: Secondary | ICD-10-CM | POA: Diagnosis not present

## 2020-11-18 DIAGNOSIS — I5030 Unspecified diastolic (congestive) heart failure: Secondary | ICD-10-CM | POA: Diagnosis not present

## 2020-11-23 DIAGNOSIS — Z20828 Contact with and (suspected) exposure to other viral communicable diseases: Secondary | ICD-10-CM | POA: Diagnosis not present

## 2020-11-23 DIAGNOSIS — M47815 Spondylosis without myelopathy or radiculopathy, thoracolumbar region: Secondary | ICD-10-CM | POA: Diagnosis not present

## 2020-11-23 DIAGNOSIS — E039 Hypothyroidism, unspecified: Secondary | ICD-10-CM | POA: Diagnosis not present

## 2020-11-23 DIAGNOSIS — I129 Hypertensive chronic kidney disease with stage 1 through stage 4 chronic kidney disease, or unspecified chronic kidney disease: Secondary | ICD-10-CM | POA: Diagnosis not present

## 2020-11-23 DIAGNOSIS — Z87440 Personal history of urinary (tract) infections: Secondary | ICD-10-CM | POA: Diagnosis not present

## 2020-11-23 DIAGNOSIS — E1121 Type 2 diabetes mellitus with diabetic nephropathy: Secondary | ICD-10-CM | POA: Diagnosis not present

## 2020-11-23 DIAGNOSIS — G8222 Paraplegia, incomplete: Secondary | ICD-10-CM | POA: Diagnosis not present

## 2020-11-23 DIAGNOSIS — F0390 Unspecified dementia without behavioral disturbance: Secondary | ICD-10-CM | POA: Diagnosis not present

## 2020-11-23 DIAGNOSIS — N183 Chronic kidney disease, stage 3 unspecified: Secondary | ICD-10-CM | POA: Diagnosis not present

## 2020-12-22 DIAGNOSIS — J45909 Unspecified asthma, uncomplicated: Secondary | ICD-10-CM | POA: Diagnosis not present

## 2020-12-22 DIAGNOSIS — L89152 Pressure ulcer of sacral region, stage 2: Secondary | ICD-10-CM | POA: Diagnosis not present

## 2020-12-22 DIAGNOSIS — H539 Unspecified visual disturbance: Secondary | ICD-10-CM | POA: Diagnosis not present

## 2020-12-22 DIAGNOSIS — D631 Anemia in chronic kidney disease: Secondary | ICD-10-CM | POA: Diagnosis not present

## 2020-12-22 DIAGNOSIS — I5030 Unspecified diastolic (congestive) heart failure: Secondary | ICD-10-CM | POA: Diagnosis not present

## 2020-12-22 DIAGNOSIS — E1151 Type 2 diabetes mellitus with diabetic peripheral angiopathy without gangrene: Secondary | ICD-10-CM | POA: Diagnosis not present

## 2020-12-22 DIAGNOSIS — F0280 Dementia in other diseases classified elsewhere without behavioral disturbance: Secondary | ICD-10-CM | POA: Diagnosis not present

## 2020-12-22 DIAGNOSIS — E039 Hypothyroidism, unspecified: Secondary | ICD-10-CM | POA: Diagnosis not present

## 2020-12-22 DIAGNOSIS — E1122 Type 2 diabetes mellitus with diabetic chronic kidney disease: Secondary | ICD-10-CM | POA: Diagnosis not present

## 2020-12-22 DIAGNOSIS — N189 Chronic kidney disease, unspecified: Secondary | ICD-10-CM | POA: Diagnosis not present

## 2020-12-22 DIAGNOSIS — K219 Gastro-esophageal reflux disease without esophagitis: Secondary | ICD-10-CM | POA: Diagnosis not present

## 2020-12-22 DIAGNOSIS — H409 Unspecified glaucoma: Secondary | ICD-10-CM | POA: Diagnosis not present

## 2020-12-22 DIAGNOSIS — I110 Hypertensive heart disease with heart failure: Secondary | ICD-10-CM | POA: Diagnosis not present

## 2020-12-22 DIAGNOSIS — I872 Venous insufficiency (chronic) (peripheral): Secondary | ICD-10-CM | POA: Diagnosis not present

## 2020-12-22 DIAGNOSIS — Z7984 Long term (current) use of oral hypoglycemic drugs: Secondary | ICD-10-CM | POA: Diagnosis not present

## 2020-12-22 DIAGNOSIS — M1990 Unspecified osteoarthritis, unspecified site: Secondary | ICD-10-CM | POA: Diagnosis not present

## 2020-12-22 DIAGNOSIS — L89312 Pressure ulcer of right buttock, stage 2: Secondary | ICD-10-CM | POA: Diagnosis not present

## 2020-12-22 DIAGNOSIS — Z85828 Personal history of other malignant neoplasm of skin: Secondary | ICD-10-CM | POA: Diagnosis not present

## 2020-12-22 DIAGNOSIS — K222 Esophageal obstruction: Secondary | ICD-10-CM | POA: Diagnosis not present

## 2020-12-23 DIAGNOSIS — E1121 Type 2 diabetes mellitus with diabetic nephropathy: Secondary | ICD-10-CM | POA: Diagnosis not present

## 2020-12-23 DIAGNOSIS — F0390 Unspecified dementia without behavioral disturbance: Secondary | ICD-10-CM | POA: Diagnosis not present

## 2020-12-23 DIAGNOSIS — M47816 Spondylosis without myelopathy or radiculopathy, lumbar region: Secondary | ICD-10-CM | POA: Diagnosis not present

## 2020-12-23 DIAGNOSIS — E039 Hypothyroidism, unspecified: Secondary | ICD-10-CM | POA: Diagnosis not present

## 2020-12-23 DIAGNOSIS — G8222 Paraplegia, incomplete: Secondary | ICD-10-CM | POA: Diagnosis not present

## 2020-12-23 DIAGNOSIS — L89152 Pressure ulcer of sacral region, stage 2: Secondary | ICD-10-CM | POA: Diagnosis not present

## 2020-12-23 DIAGNOSIS — I10 Essential (primary) hypertension: Secondary | ICD-10-CM | POA: Diagnosis not present

## 2020-12-23 DIAGNOSIS — Z7984 Long term (current) use of oral hypoglycemic drugs: Secondary | ICD-10-CM | POA: Diagnosis not present

## 2020-12-23 DIAGNOSIS — R269 Unspecified abnormalities of gait and mobility: Secondary | ICD-10-CM | POA: Diagnosis not present

## 2021-01-05 DIAGNOSIS — Z79899 Other long term (current) drug therapy: Secondary | ICD-10-CM | POA: Diagnosis not present

## 2021-01-05 DIAGNOSIS — E1121 Type 2 diabetes mellitus with diabetic nephropathy: Secondary | ICD-10-CM | POA: Diagnosis not present

## 2021-01-12 DIAGNOSIS — L89152 Pressure ulcer of sacral region, stage 2: Secondary | ICD-10-CM | POA: Diagnosis not present

## 2021-01-12 DIAGNOSIS — L89312 Pressure ulcer of right buttock, stage 2: Secondary | ICD-10-CM | POA: Diagnosis not present

## 2021-01-12 DIAGNOSIS — I110 Hypertensive heart disease with heart failure: Secondary | ICD-10-CM | POA: Diagnosis not present

## 2021-01-13 DIAGNOSIS — N1832 Chronic kidney disease, stage 3b: Secondary | ICD-10-CM | POA: Diagnosis not present

## 2021-01-13 DIAGNOSIS — E1121 Type 2 diabetes mellitus with diabetic nephropathy: Secondary | ICD-10-CM | POA: Diagnosis not present

## 2021-01-13 DIAGNOSIS — E039 Hypothyroidism, unspecified: Secondary | ICD-10-CM | POA: Diagnosis not present

## 2021-01-13 DIAGNOSIS — E8809 Other disorders of plasma-protein metabolism, not elsewhere classified: Secondary | ICD-10-CM | POA: Diagnosis not present

## 2021-01-13 DIAGNOSIS — L89159 Pressure ulcer of sacral region, unspecified stage: Secondary | ICD-10-CM | POA: Diagnosis not present

## 2021-01-13 DIAGNOSIS — R748 Abnormal levels of other serum enzymes: Secondary | ICD-10-CM | POA: Diagnosis not present

## 2021-01-13 DIAGNOSIS — M1990 Unspecified osteoarthritis, unspecified site: Secondary | ICD-10-CM | POA: Diagnosis not present

## 2021-01-13 DIAGNOSIS — R2681 Unsteadiness on feet: Secondary | ICD-10-CM | POA: Diagnosis not present

## 2021-01-13 DIAGNOSIS — F0390 Unspecified dementia without behavioral disturbance: Secondary | ICD-10-CM | POA: Diagnosis not present

## 2021-01-13 DIAGNOSIS — G8929 Other chronic pain: Secondary | ICD-10-CM | POA: Diagnosis not present

## 2021-01-13 DIAGNOSIS — I129 Hypertensive chronic kidney disease with stage 1 through stage 4 chronic kidney disease, or unspecified chronic kidney disease: Secondary | ICD-10-CM | POA: Diagnosis not present

## 2021-01-27 DIAGNOSIS — F0390 Unspecified dementia without behavioral disturbance: Secondary | ICD-10-CM | POA: Diagnosis not present

## 2021-01-27 DIAGNOSIS — E1121 Type 2 diabetes mellitus with diabetic nephropathy: Secondary | ICD-10-CM | POA: Diagnosis not present

## 2021-01-27 DIAGNOSIS — E039 Hypothyroidism, unspecified: Secondary | ICD-10-CM | POA: Diagnosis not present

## 2021-01-27 DIAGNOSIS — N390 Urinary tract infection, site not specified: Secondary | ICD-10-CM | POA: Diagnosis not present

## 2021-01-27 DIAGNOSIS — Z79899 Other long term (current) drug therapy: Secondary | ICD-10-CM | POA: Diagnosis not present

## 2021-01-27 DIAGNOSIS — R410 Disorientation, unspecified: Secondary | ICD-10-CM | POA: Diagnosis not present

## 2021-02-03 DIAGNOSIS — I739 Peripheral vascular disease, unspecified: Secondary | ICD-10-CM | POA: Diagnosis not present

## 2021-02-03 DIAGNOSIS — Q845 Enlarged and hypertrophic nails: Secondary | ICD-10-CM | POA: Diagnosis not present

## 2021-02-03 DIAGNOSIS — L603 Nail dystrophy: Secondary | ICD-10-CM | POA: Diagnosis not present

## 2021-02-10 DIAGNOSIS — E1121 Type 2 diabetes mellitus with diabetic nephropathy: Secondary | ICD-10-CM | POA: Diagnosis not present

## 2021-02-10 DIAGNOSIS — Z87440 Personal history of urinary (tract) infections: Secondary | ICD-10-CM | POA: Diagnosis not present

## 2021-02-10 DIAGNOSIS — R2681 Unsteadiness on feet: Secondary | ICD-10-CM | POA: Diagnosis not present

## 2021-02-10 DIAGNOSIS — F0390 Unspecified dementia without behavioral disturbance: Secondary | ICD-10-CM | POA: Diagnosis not present

## 2021-02-10 DIAGNOSIS — M1990 Unspecified osteoarthritis, unspecified site: Secondary | ICD-10-CM | POA: Diagnosis not present

## 2021-02-10 DIAGNOSIS — L89159 Pressure ulcer of sacral region, unspecified stage: Secondary | ICD-10-CM | POA: Diagnosis not present

## 2021-02-10 DIAGNOSIS — G8929 Other chronic pain: Secondary | ICD-10-CM | POA: Diagnosis not present

## 2021-02-10 DIAGNOSIS — E039 Hypothyroidism, unspecified: Secondary | ICD-10-CM | POA: Diagnosis not present

## 2021-02-10 DIAGNOSIS — N1832 Chronic kidney disease, stage 3b: Secondary | ICD-10-CM | POA: Diagnosis not present

## 2021-03-07 ENCOUNTER — Encounter (HOSPITAL_COMMUNITY): Payer: Self-pay | Admitting: Student

## 2021-03-07 ENCOUNTER — Emergency Department (HOSPITAL_COMMUNITY)
Admission: EM | Admit: 2021-03-07 | Discharge: 2021-03-07 | Disposition: A | Discharge status: Home / Self Care | Payer: PPO | Attending: Emergency Medicine | Admitting: Emergency Medicine

## 2021-03-07 ENCOUNTER — Other Ambulatory Visit: Payer: Self-pay

## 2021-03-07 ENCOUNTER — Emergency Department (HOSPITAL_BASED_OUTPATIENT_CLINIC_OR_DEPARTMENT_OTHER): Payer: PPO

## 2021-03-07 DIAGNOSIS — Z85828 Personal history of other malignant neoplasm of skin: Secondary | ICD-10-CM | POA: Insufficient documentation

## 2021-03-07 DIAGNOSIS — E1139 Type 2 diabetes mellitus with other diabetic ophthalmic complication: Secondary | ICD-10-CM | POA: Diagnosis not present

## 2021-03-07 DIAGNOSIS — Z79899 Other long term (current) drug therapy: Secondary | ICD-10-CM | POA: Insufficient documentation

## 2021-03-07 DIAGNOSIS — I10 Essential (primary) hypertension: Secondary | ICD-10-CM | POA: Insufficient documentation

## 2021-03-07 DIAGNOSIS — M79604 Pain in right leg: Secondary | ICD-10-CM

## 2021-03-07 DIAGNOSIS — R609 Edema, unspecified: Secondary | ICD-10-CM | POA: Diagnosis not present

## 2021-03-07 DIAGNOSIS — R5381 Other malaise: Secondary | ICD-10-CM | POA: Diagnosis not present

## 2021-03-07 DIAGNOSIS — R0902 Hypoxemia: Secondary | ICD-10-CM | POA: Diagnosis not present

## 2021-03-07 DIAGNOSIS — E1136 Type 2 diabetes mellitus with diabetic cataract: Secondary | ICD-10-CM | POA: Insufficient documentation

## 2021-03-07 DIAGNOSIS — Z7984 Long term (current) use of oral hypoglycemic drugs: Secondary | ICD-10-CM | POA: Insufficient documentation

## 2021-03-07 DIAGNOSIS — Z7401 Bed confinement status: Secondary | ICD-10-CM | POA: Diagnosis not present

## 2021-03-07 DIAGNOSIS — I82441 Acute embolism and thrombosis of right tibial vein: Secondary | ICD-10-CM | POA: Insufficient documentation

## 2021-03-07 DIAGNOSIS — M2550 Pain in unspecified joint: Secondary | ICD-10-CM | POA: Diagnosis not present

## 2021-03-07 DIAGNOSIS — E039 Hypothyroidism, unspecified: Secondary | ICD-10-CM | POA: Insufficient documentation

## 2021-03-07 DIAGNOSIS — I82401 Acute embolism and thrombosis of unspecified deep veins of right lower extremity: Secondary | ICD-10-CM | POA: Diagnosis not present

## 2021-03-07 DIAGNOSIS — R2241 Localized swelling, mass and lump, right lower limb: Secondary | ICD-10-CM | POA: Diagnosis present

## 2021-03-07 LAB — CBC WITH DIFFERENTIAL/PLATELET
Abs Immature Granulocytes: 0.11 10*3/uL — ABNORMAL HIGH (ref 0.00–0.07)
Basophils Absolute: 0.1 10*3/uL (ref 0.0–0.1)
Basophils Relative: 0 %
Eosinophils Absolute: 0.3 10*3/uL (ref 0.0–0.5)
Eosinophils Relative: 3 %
HCT: 39 % (ref 36.0–46.0)
Hemoglobin: 12.4 g/dL (ref 12.0–15.0)
Immature Granulocytes: 1 %
Lymphocytes Relative: 12 %
Lymphs Abs: 1.4 10*3/uL (ref 0.7–4.0)
MCH: 32.4 pg (ref 26.0–34.0)
MCHC: 31.8 g/dL (ref 30.0–36.0)
MCV: 101.8 fL — ABNORMAL HIGH (ref 80.0–100.0)
Monocytes Absolute: 1.5 10*3/uL — ABNORMAL HIGH (ref 0.1–1.0)
Monocytes Relative: 13 %
Neutro Abs: 8.6 10*3/uL — ABNORMAL HIGH (ref 1.7–7.7)
Neutrophils Relative %: 71 %
Platelets: 353 10*3/uL (ref 150–400)
RBC: 3.83 MIL/uL — ABNORMAL LOW (ref 3.87–5.11)
RDW: 13.5 % (ref 11.5–15.5)
WBC: 12 10*3/uL — ABNORMAL HIGH (ref 4.0–10.5)
nRBC: 0 % (ref 0.0–0.2)

## 2021-03-07 LAB — BASIC METABOLIC PANEL
Anion gap: 5 (ref 5–15)
BUN: 19 mg/dL (ref 8–23)
CO2: 35 mmol/L — ABNORMAL HIGH (ref 22–32)
Calcium: 8.7 mg/dL — ABNORMAL LOW (ref 8.9–10.3)
Chloride: 98 mmol/L (ref 98–111)
Creatinine, Ser: 1.1 mg/dL — ABNORMAL HIGH (ref 0.44–1.00)
GFR, Estimated: 46 mL/min — ABNORMAL LOW (ref >60)
Glucose, Bld: 122 mg/dL — ABNORMAL HIGH (ref 70–99)
Potassium: 4.6 mmol/L (ref 3.5–5.1)
Sodium: 138 mmol/L (ref 135–145)

## 2021-03-07 MED ORDER — APIXABAN 5 MG PO TABS
10.0000 mg | ORAL_TABLET | Freq: Two times a day (BID) | ORAL | Status: DC
  Administered 2021-03-07: 10 mg via ORAL
  Filled 2021-03-07: qty 2

## 2021-03-07 MED ORDER — APIXABAN (ELIQUIS) VTE STARTER PACK (10MG AND 5MG): ORAL_TABLET | ORAL | 0 refills | Status: DC

## 2021-03-07 MED ORDER — HYDROCODONE-ACETAMINOPHEN 5-325 MG PO TABS
1.0000 | ORAL_TABLET | Freq: Once | ORAL | Status: AC
  Administered 2021-03-07: 1 via ORAL
  Filled 2021-03-07: qty 1

## 2021-03-07 MED ORDER — APIXABAN 5 MG PO TABS: 5.0000 mg | ORAL_TABLET | Freq: Two times a day (BID) | ORAL | Status: DC

## 2021-03-07 NOTE — ED Notes (Signed)
ATTEMPTED TO CALL REPORT NO ANSWER VOICEMAIL LEFT

## 2021-03-07 NOTE — Progress Notes (Signed)
VASCULAR LAB    Right lower extremity venous duplex has been performed.  See CV proc for preliminary results.  Messaged results to [facility], PA-C, via secure chat.  Trever Streater, RVT 03/07/2021, 5:22 PM

## 2021-03-07 NOTE — ED Provider Notes (Signed)
[place] EMERGENCY DEPARTMENT Provider Note   CSN: [id number] Arrival date & time: 03/07/21  1619     History Chief Complaint  Patient presents with  . Leg Swelling    Casey Watts is a 87 y.o. female with a hx of hypertension, hypothyroidism, diabetes mellitus, asthma, and prior cellulitis who is wheelchair-bound presenting to the emergency department via EMS from [place] for evaluation of right lower leg swelling over the past 1 week.  Per EMS who spoke with facility staff patient has had progressively worsening swelling to the right lower leg over the past 1 week as well as some developing erythema.  Patient was not complaining much about the area unless it was palpated.  She states that if no one is touching it does not bother her but with palpation it does hurt.  No other alleviating or aggravating factors.  She denies fever, chills, nausea, vomiting, chest pain, dyspnea, or recent injury.  HPI     Past Medical History:  Diagnosis Date  . Anemia   . Arthritis   . Asthma    as a child  . Cancer ([place])    skin cancer on left leg  . Cataract   . Cellulitis and abscess of lower extremity 03/21/2018  . Diabetes mellitus    TYPE 2  . Esophageal stricture   . GERD (gastroesophageal reflux disease)   . Glaucoma   . Hypertension   . Hypothyroidism   . Thyroid disease     Patient Active Problem List   Diagnosis Date Noted  . Acute respiratory failure with hypoxia ([place]) 04/01/2018  . Chronic anemia 04/01/2018  . Iron deficiency anemia 04/01/2018  . Pressure injury of skin 03/16/2018  . Lactic acidosis 03/16/2018  . Diabetes mellitus type 2 in nonobese ([place]) 03/16/2018  . Cellulitis of both lower extremities 03/15/2018  . Chest pain 05/02/2017  . Diabetes mellitus with complication ([place]) 81/19/1478  . Renal insufficiency 05/02/2017  . Leukocytosis 05/02/2017  . Chronic pain 05/02/2017  . Atypical chest pain   . GERD (gastroesophageal reflux  disease) 02/13/2014  . Constipation 02/13/2014  . Hypothyroidism 02/13/2014  . Hypertension 02/13/2014  . Proximal humeral fracture 02/04/2014  . Stricture and stenosis of esophagus 02/28/2011  . Chronic duodenal ulcer without mention of hemorrhage, perforation, or obstruction 02/28/2011  . DM 01/19/2011    Past Surgical History:  Procedure Laterality Date  . APPENDECTOMY    . CATARACT EXTRACTION Left   . EYE SURGERY    . FOOT SURGERY Bilateral    Bunionectomy  . REVERSE SHOULDER ARTHROPLASTY Left 02/04/2014   DR MURPHY  . REVERSE SHOULDER ARTHROPLASTY Left 02/04/2014   Procedure: REVERSE SHOULDER ARTHROPLASTY;  Surgeon: Renette Butters, MD;  Location: [place];  Service: Orthopedics;  Laterality: Left;     OB History   No obstetric history on file.     Family History  Problem Relation Age of Onset  . Heart disease Brother   . Colon cancer Neg Hx   . Cancer Neg Hx     Social History   Tobacco Use  . Smoking status: Never Smoker  . Smokeless tobacco: Never Used  Vaping Use  . Vaping Use: Never used  Substance Use Topics  . Alcohol use: Yes    Comment: occasional  . Drug use: No    Home Medications Prior to Admission medications   Medication Sig Start Date End Date Taking? Authorizing Provider  calcium-vitamin D (OSCAL WITH D) 500-200 MG-UNIT  per tablet Take 1 tablet by mouth daily with breakfast.     [provider]  cephALEXin (KEFLEX) 250 MG capsule Take 1 capsule (250 mg total) by mouth every 8 (eight) hours. 03/22/18   Dana Allan I, MD  cholecalciferol (VITAMIN D) 1000 UNITS tablet Take 1,000 Units by mouth daily.    [provider]  docusate sodium (COLACE) 100 MG capsule Take 1 capsule (100 mg total) by mouth 2 (two) times daily as needed for mild constipation. 01/23/14   Kingsley Spittle, MD  ferrous sulfate 325 (65 FE) MG tablet Take 1 tablet (325 mg total) by mouth daily with breakfast. 03/23/18   Bonnell Public, MD  glimepiride  (AMARYL) 2 MG tablet Take 2 mg by mouth daily with breakfast.    [provider]  HYDROcodone-acetaminophen (NORCO/VICODIN) 5-325 MG per tablet Take 1 tablet by mouth every 4 (four) hours as needed for moderate pain. Take one tablet by mouth every 4 hours as needed for pain 03/20/14   Lauree Chandler, NP  lansoprazole (PREVACID) 15 MG capsule Take 1 capsule (15 mg total) by mouth 2 (two) times daily before a meal. 05/03/17   Bonnielee Haff, MD  levothyroxine (SYNTHROID, LEVOTHROID) 112 MCG tablet Take 112 mcg by mouth daily. 1/2 tab on Sunday    [provider]  SIMPLY SALINE NA Place 1 spray into both nostrils at bedtime as needed (nasal congestion).    [provider]  vitamin C (ASCORBIC ACID) 500 MG tablet Take 500 mg by mouth daily.    [provider]    Allergies    Patient has no known allergies.  Review of Systems   Review of Systems  Constitutional: Negative for chills and fever.  Respiratory: Negative for shortness of breath.   Cardiovascular: Positive for leg swelling. Negative for chest pain.  Gastrointestinal: Negative for abdominal pain, nausea and vomiting.  Musculoskeletal: Positive for myalgias.  Skin: Positive for color change.  All other systems reviewed and are negative.   Physical Exam Updated Vital Signs There were no vitals taken for this visit.  Physical Exam Vitals and nursing note reviewed.  Constitutional:      General: She is not in acute distress.    Appearance: She is well-developed. She is not toxic-appearing.  HENT:     Head: Normocephalic and atraumatic.  Eyes:     General:        Right eye: No discharge.        Left eye: No discharge.     Conjunctiva/sclera: Conjunctivae normal.  Cardiovascular:     Rate and Rhythm: Normal rate and regular rhythm.     Comments: 2+ symmetric DP pulses bilaterally. Pulmonary:     Effort: Pulmonary effort is normal. No respiratory distress.     Breath sounds: Normal breath  sounds. No wheezing, rhonchi or rales.  Abdominal:     General: There is no distension.     Palpations: Abdomen is soft.     Tenderness: There is no abdominal tenderness.  Musculoskeletal:     Cervical back: Neck supple.     Comments: Lower extremities: Patient has a small skin tear wound to the proximal anterior right lower leg.  She has notable pitting edema to the right lower leg anteriorly as well as to the anterior ankle/dorsum of the foot as well- approximately 2-3+, no significant edema to the left lower extremity.  She has a mild erythema to the anterior right lower leg, not circumferential.  She  is tender to palpation to the diffuse right lower leg including the calf.  Otherwise no significant tenderness. Able to flex/extend the knee and plantar/dorsiflexion the ankle.   Skin:    General: Skin is warm and dry.     Capillary Refill: Capillary refill takes less than 2 seconds.     Findings: No rash.  Neurological:     Mental Status: She is alert.     Comments: Clear speech.  Sensation grossly intact bilateral lower extremities.  5-5 strength with plantar dorsiflexion bilaterally.  Psychiatric:        Behavior: Behavior normal.       ED Results / Procedures / Treatments   Labs (all labs ordered are listed, but only abnormal results are displayed) Labs Reviewed - No data to display  EKG None  Radiology VAS [place] LOWER EXTREMITY VENOUS (DVT) (ONLY [facility] & [facility] 7a-7p)  Result Date: 03/07/2021  Lower Venous DVT Study Indications: pain, pitting edema, and weeping of right lower extremity.  Risk Factors: Patient is wheelchair bound. Limitations: Patient's positioning and significant calf edema. Comparison Study: No prior Right LE study on file Performing Technologist: Sharion Dove RVS  Examination Guidelines: A complete evaluation includes B-mode imaging, spectral Doppler, color Doppler, and power Doppler as needed of all accessible portions of each vessel. Bilateral testing is considered  an integral part of a complete examination. Limited examinations for reoccurring indications may be performed as noted. The reflux portion of the exam is performed with the patient in reverse Trendelenburg.  +---------+---------------+---------+-----------+----------+-------------------+ RIGHT    CompressibilityPhasicitySpontaneityPropertiesThrombus Aging      +---------+---------------+---------+-----------+----------+-------------------+ CFV      Full                                         pulsatile           +---------+---------------+---------+-----------+----------+-------------------+ SFJ      Full                                                             +---------+---------------+---------+-----------+----------+-------------------+ FV Prox  Full                                                             +---------+---------------+---------+-----------+----------+-------------------+ FV Mid   Full                                                             +---------+---------------+---------+-----------+----------+-------------------+ FV DistalFull                                                             +---------+---------------+---------+-----------+----------+-------------------+ PFV  Full                                                             +---------+---------------+---------+-----------+----------+-------------------+ POP      Full                                         pulsatile           +---------+---------------+---------+-----------+----------+-------------------+ PTV      None                                                             +---------+---------------+---------+-----------+----------+-------------------+ PERO                                                  Not well visualized +---------+---------------+---------+-----------+----------+-------------------+   Left Technical Findings: Left leg not  evaluated.   Summary: RIGHT: - Findings consistent with acute deep vein thrombosis involving the right posterior tibial veins. significant interstitial edema noted throughout calf   *See table(s) above for measurements and observations.    Preliminary     Procedures Procedures   Medications Ordered in ED Medications  apixaban (ELIQUIS) tablet 10 mg (10 mg Oral Given 03/07/21 1945)    Followed by  apixaban (ELIQUIS) tablet 5 mg (has no administration in time range)  HYDROcodone-acetaminophen (NORCO/VICODIN) 5-325 MG per tablet 1 tablet (1 tablet Oral Given 03/07/21 1937)    ED Course  I have reviewed the triage vital signs and the nursing notes.  Pertinent labs & imaging results that were available during my care of the patient were reviewed by me and considered in my medical decision making (see chart for details).    MDM Rules/Calculators/A&P                         Patient presents to the ED for evaluation of right lower extremity swelling for the past 1 week.  Nontoxic, vitals without significant abnormality.  Additional history obtained:  Additional history obtained from chart review & nursing note review.   Imaging Studies ordered:  I ordered imaging studies which included RLE venous duplex, I independently reviewed, formal radiology impression shows:  Findings consistent with acute deep vein thrombosis involving the right posterior tibial veins. significant interstitial edema noted throughout calf    Lab Tests:  I Ordered, reviewed, and interpreted labs, which included:  CBC: Mild leukocytosis similar to prior BMP: Renal function fairly similar to prior.  Mild bicarb elevation.  ED Course:  Patient with findings consistent with acute DVT. NVI distally.  No dyspnea/chest pain/hemoptysis, not tachycardia, not significant hypoxic, low suspicion for PE at this time. Myself and Dr. Vanita Panda spoke with the patient and her daughter-in-law at bedside as well as her son via telephone  regarding risk/benefits of anticoagulation given patient's age and that she is wheelchair-bound, through shared decision  making will proceed with anticoagulation as patient is chair/bed bound, no hx of frequent falls, and no history of GI bleed. First dose of eliquis given in the ED. We discussed results, treatment plan, need for follow-up, and return precautions with the patient. Provided opportunity for questions, patient confirmed understanding and is in agreement with plan.   This is a shared visit with supervising physician Dr. Vanita Panda who has independently evaluated patient & provided guidance in evaluation/management/disposition, in agreement with care   Portions of this note were generated with Dragon dictation software. Dictation errors may occur despite best attempts at proofreading.  Final Clinical Impression(s) / ED Diagnoses Final diagnoses:  Acute deep vein thrombosis (DVT) of tibial vein of right lower extremity ([place])    Rx / DC Orders ED Discharge Orders         Ordered    APIXABAN (ELIQUIS) VTE STARTER PACK (10MG  AND 5MG )        03/07/21 1958           Leafy Kindle 03/07/21 2012    Carmin Muskrat, MD 03/08/21 0002

## 2021-03-07 NOTE — ED Triage Notes (Signed)
Patient arrived by [facility] from [facility] assisted living, patient is wheelchair bound. Patient is hard of hearing, patient has right lower leg swelling with drainage and redness x 1 week. Extremity cool to touch but good sensation and movement. denies trauma. Reports pain only mild at this time

## 2021-03-07 NOTE — ED Notes (Signed)
WAITING ON TRANSPORTATION BACK TO FACILITY AT THIS TIME

## 2021-03-07 NOTE — ED Notes (Signed)
CALLED TO ROOM BY DAUGHTER. DAUGHTER LEFT ROOM FOR A FEW MIN AND CAME BACK TO PT REMOVED HER PIV

## 2021-03-07 NOTE — ED Notes (Signed)
PTAR CALLED  °

## 2021-03-07 NOTE — Discharge Instructions (Signed)
You were seen in the ER today for her right now leg pain and swelling.  Your ultrasound showed findings of a DVT otherwise known as a blood clot.  We are starting you on Eliquis, this is a blood thinner, please take this as prescribed and the starter pack.  Please see attached information about taking blood thinners.  Your blood work showed that your bicarb is a little bit higher than it has been in the past, otherwise it was similar to prior labs you have had done.  Please have this rechecked by your primary care provider.  Please take Tylenol per over-the-counter dosing to help with pain.  Please follow with your primary care provider within 3 days for recheck of your leg.  Return to the ER for new or worsening symptoms including but not limited to worsening swelling, worsening pain, discoloration of the leg, fever, chills, numbness, weakness, chest pain, trouble breathing, coughing up blood, passing out, if you hit your head, if you notice blood in your stool, if your stools are dark/tarry, if you have blood in your urine, you had any type of traumatic accident, or you have any other concerns.

## 2021-03-07 NOTE — ED Notes (Signed)
RECEIVED VERBAL REPORT FROM KATRINA AT THIS TIME

## 2021-03-11 DIAGNOSIS — M1990 Unspecified osteoarthritis, unspecified site: Secondary | ICD-10-CM | POA: Diagnosis not present

## 2021-03-11 DIAGNOSIS — F0390 Unspecified dementia without behavioral disturbance: Secondary | ICD-10-CM | POA: Diagnosis not present

## 2021-03-11 DIAGNOSIS — E039 Hypothyroidism, unspecified: Secondary | ICD-10-CM | POA: Diagnosis not present

## 2021-03-11 DIAGNOSIS — G8929 Other chronic pain: Secondary | ICD-10-CM | POA: Diagnosis not present

## 2021-03-11 DIAGNOSIS — R2681 Unsteadiness on feet: Secondary | ICD-10-CM | POA: Diagnosis not present

## 2021-03-11 DIAGNOSIS — E1121 Type 2 diabetes mellitus with diabetic nephropathy: Secondary | ICD-10-CM | POA: Diagnosis not present

## 2021-03-11 DIAGNOSIS — L89152 Pressure ulcer of sacral region, stage 2: Secondary | ICD-10-CM | POA: Diagnosis not present

## 2021-03-11 DIAGNOSIS — I82441 Acute embolism and thrombosis of right tibial vein: Secondary | ICD-10-CM | POA: Diagnosis not present

## 2021-03-16 DIAGNOSIS — I10 Essential (primary) hypertension: Secondary | ICD-10-CM | POA: Diagnosis not present

## 2021-03-16 DIAGNOSIS — E039 Hypothyroidism, unspecified: Secondary | ICD-10-CM | POA: Diagnosis not present

## 2021-03-16 DIAGNOSIS — E559 Vitamin D deficiency, unspecified: Secondary | ICD-10-CM | POA: Diagnosis not present

## 2021-03-16 DIAGNOSIS — Z79899 Other long term (current) drug therapy: Secondary | ICD-10-CM | POA: Diagnosis not present

## 2021-03-23 DIAGNOSIS — E039 Hypothyroidism, unspecified: Secondary | ICD-10-CM | POA: Diagnosis not present

## 2021-03-23 DIAGNOSIS — I10 Essential (primary) hypertension: Secondary | ICD-10-CM | POA: Diagnosis not present

## 2021-03-23 DIAGNOSIS — Z7984 Long term (current) use of oral hypoglycemic drugs: Secondary | ICD-10-CM | POA: Diagnosis not present

## 2021-03-29 ENCOUNTER — Inpatient Hospital Stay (HOSPITAL_COMMUNITY)
Admission: EM | Admit: 2021-03-29 | Discharge: 2021-04-08 | DRG: 481 | Disposition: A | Discharge status: Hospice | Payer: PPO | Source: Skilled Nursing Facility | Attending: Internal Medicine | Admitting: Internal Medicine

## 2021-03-29 ENCOUNTER — Emergency Department (HOSPITAL_COMMUNITY): Payer: PPO

## 2021-03-29 ENCOUNTER — Other Ambulatory Visit: Payer: Self-pay

## 2021-03-29 ENCOUNTER — Encounter (HOSPITAL_COMMUNITY): Payer: Self-pay | Admitting: Emergency Medicine

## 2021-03-29 DIAGNOSIS — Z515 Encounter for palliative care: Secondary | ICD-10-CM | POA: Diagnosis not present

## 2021-03-29 DIAGNOSIS — J9811 Atelectasis: Secondary | ICD-10-CM | POA: Diagnosis not present

## 2021-03-29 DIAGNOSIS — Z20822 Contact with and (suspected) exposure to covid-19: Secondary | ICD-10-CM | POA: Diagnosis not present

## 2021-03-29 DIAGNOSIS — Z993 Dependence on wheelchair: Secondary | ICD-10-CM

## 2021-03-29 DIAGNOSIS — T148XXA Other injury of unspecified body region, initial encounter: Secondary | ICD-10-CM

## 2021-03-29 DIAGNOSIS — D62 Acute posthemorrhagic anemia: Secondary | ICD-10-CM | POA: Diagnosis not present

## 2021-03-29 DIAGNOSIS — R1310 Dysphagia, unspecified: Secondary | ICD-10-CM | POA: Diagnosis present

## 2021-03-29 DIAGNOSIS — Z7984 Long term (current) use of oral hypoglycemic drugs: Secondary | ICD-10-CM

## 2021-03-29 DIAGNOSIS — D509 Iron deficiency anemia, unspecified: Secondary | ICD-10-CM | POA: Diagnosis present

## 2021-03-29 DIAGNOSIS — N289 Disorder of kidney and ureter, unspecified: Secondary | ICD-10-CM

## 2021-03-29 DIAGNOSIS — R609 Edema, unspecified: Secondary | ICD-10-CM | POA: Diagnosis not present

## 2021-03-29 DIAGNOSIS — E039 Hypothyroidism, unspecified: Secondary | ICD-10-CM | POA: Diagnosis present

## 2021-03-29 DIAGNOSIS — G8929 Other chronic pain: Secondary | ICD-10-CM | POA: Diagnosis not present

## 2021-03-29 DIAGNOSIS — I4891 Unspecified atrial fibrillation: Secondary | ICD-10-CM | POA: Diagnosis not present

## 2021-03-29 DIAGNOSIS — E119 Type 2 diabetes mellitus without complications: Secondary | ICD-10-CM

## 2021-03-29 DIAGNOSIS — Z7401 Bed confinement status: Secondary | ICD-10-CM | POA: Diagnosis not present

## 2021-03-29 DIAGNOSIS — Z7901 Long term (current) use of anticoagulants: Secondary | ICD-10-CM | POA: Diagnosis not present

## 2021-03-29 DIAGNOSIS — Z79899 Other long term (current) drug therapy: Secondary | ICD-10-CM

## 2021-03-29 DIAGNOSIS — I959 Hypotension, unspecified: Secondary | ICD-10-CM | POA: Diagnosis not present

## 2021-03-29 DIAGNOSIS — Y92099 Unspecified place in other non-institutional residence as the place of occurrence of the external cause: Secondary | ICD-10-CM

## 2021-03-29 DIAGNOSIS — W010XXA Fall on same level from slipping, tripping and stumbling without subsequent striking against object, initial encounter: Secondary | ICD-10-CM | POA: Diagnosis present

## 2021-03-29 DIAGNOSIS — D508 Other iron deficiency anemias: Secondary | ICD-10-CM | POA: Diagnosis not present

## 2021-03-29 DIAGNOSIS — N182 Chronic kidney disease, stage 2 (mild): Secondary | ICD-10-CM | POA: Diagnosis present

## 2021-03-29 DIAGNOSIS — R0902 Hypoxemia: Secondary | ICD-10-CM | POA: Diagnosis not present

## 2021-03-29 DIAGNOSIS — Z85828 Personal history of other malignant neoplasm of skin: Secondary | ICD-10-CM

## 2021-03-29 DIAGNOSIS — G894 Chronic pain syndrome: Secondary | ICD-10-CM | POA: Diagnosis not present

## 2021-03-29 DIAGNOSIS — N390 Urinary tract infection, site not specified: Secondary | ICD-10-CM | POA: Diagnosis not present

## 2021-03-29 DIAGNOSIS — Z043 Encounter for examination and observation following other accident: Secondary | ICD-10-CM | POA: Diagnosis not present

## 2021-03-29 DIAGNOSIS — Z66 Do not resuscitate: Secondary | ICD-10-CM | POA: Diagnosis not present

## 2021-03-29 DIAGNOSIS — I10 Essential (primary) hypertension: Secondary | ICD-10-CM | POA: Diagnosis present

## 2021-03-29 DIAGNOSIS — F0390 Unspecified dementia without behavioral disturbance: Secondary | ICD-10-CM | POA: Diagnosis present

## 2021-03-29 DIAGNOSIS — E1122 Type 2 diabetes mellitus with diabetic chronic kidney disease: Secondary | ICD-10-CM | POA: Diagnosis present

## 2021-03-29 DIAGNOSIS — Z86718 Personal history of other venous thrombosis and embolism: Secondary | ICD-10-CM

## 2021-03-29 DIAGNOSIS — I700 Atherosclerosis of aorta: Secondary | ICD-10-CM | POA: Diagnosis not present

## 2021-03-29 DIAGNOSIS — B9620 Unspecified Escherichia coli [E. coli] as the cause of diseases classified elsewhere: Secondary | ICD-10-CM | POA: Diagnosis present

## 2021-03-29 DIAGNOSIS — R5381 Other malaise: Secondary | ICD-10-CM | POA: Diagnosis present

## 2021-03-29 DIAGNOSIS — S80919A Unspecified superficial injury of unspecified knee, initial encounter: Secondary | ICD-10-CM | POA: Diagnosis not present

## 2021-03-29 DIAGNOSIS — D649 Anemia, unspecified: Secondary | ICD-10-CM | POA: Diagnosis present

## 2021-03-29 DIAGNOSIS — D72829 Elevated white blood cell count, unspecified: Secondary | ICD-10-CM | POA: Diagnosis present

## 2021-03-29 DIAGNOSIS — I129 Hypertensive chronic kidney disease with stage 1 through stage 4 chronic kidney disease, or unspecified chronic kidney disease: Secondary | ICD-10-CM | POA: Diagnosis present

## 2021-03-29 DIAGNOSIS — M2550 Pain in unspecified joint: Secondary | ICD-10-CM | POA: Diagnosis not present

## 2021-03-29 DIAGNOSIS — Z96612 Presence of left artificial shoulder joint: Secondary | ICD-10-CM | POA: Diagnosis present

## 2021-03-29 DIAGNOSIS — M25551 Pain in right hip: Secondary | ICD-10-CM | POA: Diagnosis present

## 2021-03-29 DIAGNOSIS — J9601 Acute respiratory failure with hypoxia: Secondary | ICD-10-CM | POA: Diagnosis not present

## 2021-03-29 DIAGNOSIS — S72331A Displaced oblique fracture of shaft of right femur, initial encounter for closed fracture: Secondary | ICD-10-CM | POA: Diagnosis not present

## 2021-03-29 DIAGNOSIS — G319 Degenerative disease of nervous system, unspecified: Secondary | ICD-10-CM | POA: Diagnosis not present

## 2021-03-29 DIAGNOSIS — K219 Gastro-esophageal reflux disease without esophagitis: Secondary | ICD-10-CM | POA: Diagnosis not present

## 2021-03-29 DIAGNOSIS — R410 Disorientation, unspecified: Secondary | ICD-10-CM | POA: Diagnosis not present

## 2021-03-29 DIAGNOSIS — Z79890 Hormone replacement therapy (postmenopausal): Secondary | ICD-10-CM

## 2021-03-29 DIAGNOSIS — W19XXXA Unspecified fall, initial encounter: Secondary | ICD-10-CM

## 2021-03-29 DIAGNOSIS — S72451A Displaced supracondylar fracture without intracondylar extension of lower end of right femur, initial encounter for closed fracture: Principal | ICD-10-CM | POA: Diagnosis present

## 2021-03-29 DIAGNOSIS — S72401K Unspecified fracture of lower end of right femur, subsequent encounter for closed fracture with nonunion: Secondary | ICD-10-CM | POA: Diagnosis not present

## 2021-03-29 DIAGNOSIS — Z419 Encounter for procedure for purposes other than remedying health state, unspecified: Secondary | ICD-10-CM

## 2021-03-29 DIAGNOSIS — M5136 Other intervertebral disc degeneration, lumbar region: Secondary | ICD-10-CM | POA: Diagnosis not present

## 2021-03-29 DIAGNOSIS — E034 Atrophy of thyroid (acquired): Secondary | ICD-10-CM | POA: Diagnosis not present

## 2021-03-29 DIAGNOSIS — R0689 Other abnormalities of breathing: Secondary | ICD-10-CM | POA: Diagnosis not present

## 2021-03-29 DIAGNOSIS — I9589 Other hypotension: Secondary | ICD-10-CM | POA: Diagnosis not present

## 2021-03-29 DIAGNOSIS — S72491A Other fracture of lower end of right femur, initial encounter for closed fracture: Secondary | ICD-10-CM

## 2021-03-29 DIAGNOSIS — S72301A Unspecified fracture of shaft of right femur, initial encounter for closed fracture: Secondary | ICD-10-CM | POA: Diagnosis not present

## 2021-03-29 DIAGNOSIS — S72301D Unspecified fracture of shaft of right femur, subsequent encounter for closed fracture with routine healing: Secondary | ICD-10-CM | POA: Diagnosis not present

## 2021-03-29 DIAGNOSIS — K5900 Constipation, unspecified: Secondary | ICD-10-CM | POA: Diagnosis present

## 2021-03-29 DIAGNOSIS — S72401A Unspecified fracture of lower end of right femur, initial encounter for closed fracture: Secondary | ICD-10-CM | POA: Diagnosis present

## 2021-03-29 DIAGNOSIS — K267 Chronic duodenal ulcer without hemorrhage or perforation: Secondary | ICD-10-CM | POA: Diagnosis not present

## 2021-03-29 DIAGNOSIS — M47816 Spondylosis without myelopathy or radiculopathy, lumbar region: Secondary | ICD-10-CM | POA: Diagnosis not present

## 2021-03-29 LAB — CBC WITH DIFFERENTIAL/PLATELET
Abs Immature Granulocytes: 0.1 10*3/uL — ABNORMAL HIGH (ref 0.00–0.07)
Basophils Absolute: 0 10*3/uL (ref 0.0–0.1)
Basophils Relative: 0 %
Eosinophils Absolute: 0 10*3/uL (ref 0.0–0.5)
Eosinophils Relative: 0 %
HCT: 31.1 % — ABNORMAL LOW (ref 36.0–46.0)
Hemoglobin: 9.9 g/dL — ABNORMAL LOW (ref 12.0–15.0)
Immature Granulocytes: 1 %
Lymphocytes Relative: 5 %
Lymphs Abs: 0.7 10*3/uL (ref 0.7–4.0)
MCH: 32.2 pg (ref 26.0–34.0)
MCHC: 31.8 g/dL (ref 30.0–36.0)
MCV: 101.3 fL — ABNORMAL HIGH (ref 80.0–100.0)
Monocytes Absolute: 1.3 10*3/uL — ABNORMAL HIGH (ref 0.1–1.0)
Monocytes Relative: 9 %
Neutro Abs: 12.7 10*3/uL — ABNORMAL HIGH (ref 1.7–7.7)
Neutrophils Relative %: 85 %
Platelets: 269 10*3/uL (ref 150–400)
RBC: 3.07 MIL/uL — ABNORMAL LOW (ref 3.87–5.11)
RDW: 14 % (ref 11.5–15.5)
WBC: 14.8 10*3/uL — ABNORMAL HIGH (ref 4.0–10.5)
nRBC: 0 % (ref 0.0–0.2)

## 2021-03-29 LAB — RESP PANEL BY RT-PCR (FLU A&B, COVID) ARPGX2
Influenza A by PCR: NEGATIVE
Influenza B by PCR: NEGATIVE
SARS Coronavirus 2 by RT PCR: NEGATIVE

## 2021-03-29 LAB — BASIC METABOLIC PANEL
Anion gap: 12 (ref 5–15)
BUN: 19 mg/dL (ref 8–23)
CO2: 27 mmol/L (ref 22–32)
Calcium: 8 mg/dL — ABNORMAL LOW (ref 8.9–10.3)
Chloride: 98 mmol/L (ref 98–111)
Creatinine, Ser: 1.14 mg/dL — ABNORMAL HIGH (ref 0.44–1.00)
GFR, Estimated: 44 mL/min — ABNORMAL LOW (ref >60)
Glucose, Bld: 190 mg/dL — ABNORMAL HIGH (ref 70–99)
Potassium: 4.3 mmol/L (ref 3.5–5.1)
Sodium: 137 mmol/L (ref 135–145)

## 2021-03-29 LAB — GLUCOSE, CAPILLARY: Glucose-Capillary: 190 mg/dL — ABNORMAL HIGH (ref 70–99)

## 2021-03-29 MED ORDER — FENTANYL CITRATE (PF) 100 MCG/2ML IJ SOLN
25.0000 ug | Freq: Once | INTRAMUSCULAR | Status: AC
Start: 2021-03-29 — End: 2021-03-29
  Administered 2021-03-29: 25 ug via INTRAVENOUS
  Filled 2021-03-29: qty 2

## 2021-03-29 MED ORDER — ADULT MULTIVITAMIN W/MINERALS CH
1.0000 | ORAL_TABLET | Freq: Every day | ORAL | Status: DC
  Administered 2021-04-01 – 2021-04-06 (×5): 1 via ORAL
  Filled 2021-03-29 (×7): qty 1

## 2021-03-29 MED ORDER — HYDROCODONE-ACETAMINOPHEN 5-325 MG PO TABS
1.0000 | ORAL_TABLET | Freq: Four times a day (QID) | ORAL | Status: DC | PRN
  Administered 2021-03-29 – 2021-04-02 (×10): 1 via ORAL
  Administered 2021-04-03: 2 via ORAL
  Administered 2021-04-03: 1 via ORAL
  Administered 2021-04-03 – 2021-04-06 (×8): 2 via ORAL
  Filled 2021-03-29 (×3): qty 1
  Filled 2021-03-29 (×2): qty 2
  Filled 2021-03-29 (×5): qty 1
  Filled 2021-03-29 (×5): qty 2
  Filled 2021-03-29 (×4): qty 1
  Filled 2021-03-29 (×2): qty 2

## 2021-03-29 MED ORDER — INSULIN ASPART 100 UNIT/ML ~~LOC~~ SOLN
0.0000 [IU] | SUBCUTANEOUS | Status: DC
  Administered 2021-03-29 – 2021-03-30 (×3): 1 [IU] via SUBCUTANEOUS

## 2021-03-29 MED ORDER — SODIUM CHLORIDE 0.9 % IV BOLUS
500.0000 mL | Freq: Once | INTRAVENOUS | Status: AC
  Administered 2021-03-29: 500 mL via INTRAVENOUS

## 2021-03-29 MED ORDER — MORPHINE SULFATE (PF) 2 MG/ML IV SOLN: 2.0000 mg | Freq: Once | INTRAVENOUS | Status: DC

## 2021-03-29 MED ORDER — HYDROMORPHONE HCL 1 MG/ML IJ SOLN
0.5000 mg | INTRAMUSCULAR | Status: DC | PRN
  Filled 2021-03-29: qty 0.5

## 2021-03-29 MED ORDER — FENTANYL CITRATE (PF) 100 MCG/2ML IJ SOLN
25.0000 ug | Freq: Once | INTRAMUSCULAR | Status: AC
  Administered 2021-03-29: 25 ug via INTRAVENOUS
  Filled 2021-03-29: qty 2

## 2021-03-29 MED ORDER — PANTOPRAZOLE SODIUM 20 MG PO TBEC
20.0000 mg | DELAYED_RELEASE_TABLET | Freq: Every day | ORAL | Status: DC
  Administered 2021-04-01 – 2021-04-07 (×6): 20 mg via ORAL
  Filled 2021-03-29 (×9): qty 1

## 2021-03-29 MED ORDER — VITAMIN D 25 MCG (1000 UNIT) PO TABS
1000.0000 [IU] | ORAL_TABLET | Freq: Every day | ORAL | Status: DC
  Administered 2021-04-01 – 2021-04-07 (×6): 1000 [IU] via ORAL
  Filled 2021-03-29 (×9): qty 1

## 2021-03-29 MED ORDER — TRANEXAMIC ACID-NACL 1000-0.7 MG/100ML-% IV SOLN
1000.0000 mg | INTRAVENOUS | Status: AC
  Administered 2021-03-29: 1000 mg via INTRAVENOUS
  Filled 2021-03-29: qty 100

## 2021-03-29 MED ORDER — LEVOTHYROXINE SODIUM 100 MCG PO TABS
200.0000 ug | ORAL_TABLET | Freq: Every day | ORAL | Status: DC
  Administered 2021-03-30 – 2021-04-06 (×8): 200 ug via ORAL
  Filled 2021-03-29 (×8): qty 2

## 2021-03-29 MED ORDER — POLYETHYLENE GLYCOL 3350 17 G PO PACK: 17.0000 g | PACK | Freq: Every day | ORAL | Status: DC | PRN

## 2021-03-29 NOTE — ED Notes (Signed)
Patient transported to CT 

## 2021-03-29 NOTE — Progress Notes (Signed)
Pharmacy Note Pharmacy consulted by ortho to dose TXA for femur fracture. Medication ordered and supplied to RN.  Claudina Lick, PharmD PGY1 Acute Care Pharmacy Resident 03/29/2021 7:50 PM  Please check [email or web address] for unit-specific pharmacy phone numbers.

## 2021-03-29 NOTE — ED Notes (Signed)
Attempted to call report

## 2021-03-29 NOTE — H&P (Signed)
History and Physical   Casey Watts [id number] DOB: 11/25/1924 DOA: 03/29/2021  PCP: Pcp, No   Patient coming from: Assisted living facility  Chief Complaint: Fall  HPI: Casey Watts is a 87 y.o. female with medical history significant of anemia, chronic duodenal ulcer, chronic pain, constipation, diabetes, GERD, hypertension, hypothyroidism, CKD, cataracts who presents after a fall at her assisted living facility.  Patient reportedly fell when transferring from 1 chair to another.  History obtained with assistance of patient's family and chart review.  In route she had a right knee deformity noted.  She also was noted to have drop in her blood pressure from the 100s to 123XX123 or 0000000 systolic after receiving fentanyl by EMS.  Chart review in the ED states there was difficulty to flaring a right foot pulse.  But sensation is intact in foot is warm to touch.  Patient was diagnosed with DVT at the beginning of April and started on Eliquis at that time.  She has been taking medication for about 3 weeks.  Prior to this event patient was feeling okay no fever, chills, chest pain, shortness of breath, abdominal pain, constipation, diarrhea, urinary symptoms.  Of note, patient's family states they have not decided whether or not to go forward with surgery tomorrow but they are leaning towards yes.  ED Course: Vital signs in the ED significant for soft blood pressure in the 0000000 to 123XX123 systolic.  Lab work-up showed CMP with creatinine stable at 1.14, glucose 190, calcium 8.  CBC with leukocytosis to 14.8 and hemoglobin of 9.9.  Respiratory panel for flu and COVID was negative.  Patient was typed and screened in the ED.  Right knee and right femur x-ray showed comminuted distal femur fracture.  Pelvis x-ray without acute unreality.  Chest x-ray with right linear atelectasis but otherwise no acute abnormality.  CT head showed no acute abnormality.  CT L-spine showed no acute abnormality but did  demonstrate degenerative disc disease.  Patient received medication for pain control and a 500 cc bolus in the ED.  Orthopedics was consulted and ordered TXA.  I have asked for patient to be n.p.o. at midnight for possible surgery.  Review of Systems: As per HPI otherwise all other systems reviewed and are negative.  Past Medical History:  Diagnosis Date  . Anemia   . Arthritis   . Asthma    as a child  . Cancer ([place])    skin cancer on left leg  . Cataract   . Cellulitis and abscess of lower extremity 03/21/2018  . Diabetes mellitus    TYPE 2  . Esophageal stricture   . GERD (gastroesophageal reflux disease)   . Glaucoma   . Hypertension   . Hypothyroidism   . Thyroid disease     Past Surgical History:  Procedure Laterality Date  . APPENDECTOMY    . CATARACT EXTRACTION Left   . EYE SURGERY    . FOOT SURGERY Bilateral    Bunionectomy  . REVERSE SHOULDER ARTHROPLASTY Left 02/04/2014   DR MURPHY  . REVERSE SHOULDER ARTHROPLASTY Left 02/04/2014   Procedure: REVERSE SHOULDER ARTHROPLASTY;  Surgeon: Renette Butters, MD;  Location: [facility];  Service: Orthopedics;  Laterality: Left;    Social History  reports that she has never smoked. She has never used smokeless tobacco. She reports current alcohol use. She reports that she does not use drugs.  No Known Allergies  Family History  Problem Relation Age of Onset  . Heart  disease Brother   . Colon cancer Neg Hx   . Cancer Neg Hx   Reviewed on admission  Prior to Admission medications   Medication Sig Start Date End Date Taking? Authorizing Provider  acetaminophen (TYLENOL) 650 MG CR tablet Take 650 mg by mouth See admin instructions. Take one tablet (650 mg) by mouth twice daily, may also take one tablet (650 mg) daily as needed for pain   Yes [provider]  Amino Acids-Protein Hydrolys (FEEDING SUPPLEMENT, PRO-STAT SUGAR FREE 64,) LIQD Take 30 mLs by mouth See admin instructions. Take 30 ml by mouth twice daily -  10am and 2pm ([place])   Yes [provider]  apixaban (ELIQUIS) 5 MG TABS tablet Take 5 mg by mouth 2 (two) times daily.   Yes [provider]  cholecalciferol (VITAMIN D) 1000 UNITS tablet Take 1,000 Units by mouth daily at 6 (six) AM.   Yes [provider]  docusate sodium (COLACE) 100 MG capsule Take 1 capsule (100 mg total) by mouth 2 (two) times daily as needed for mild constipation. 01/23/14  Yes Kingsley Spittle, MD  Emollient (AQUAPHOR EX) Apply 1 application topically See admin instructions. Apply topically daily to both legs prior to ted hose placement (41% ointment)   Yes [provider]  ferrous sulfate 325 (65 FE) MG tablet Take 1 tablet (325 mg total) by mouth daily with breakfast. Patient taking differently: Take 325 mg by mouth daily at 6 (six) AM. 03/23/18  Yes Ogbata, Babs Bertin, MD  furosemide (LASIX) 20 MG tablet Take 10 mg by mouth daily at 6 (six) AM.   Yes [provider]  glimepiride (AMARYL) 1 MG tablet Take 1 mg by mouth daily at 6 (six) AM.   Yes [provider]  lansoprazole (PREVACID) 15 MG capsule Take 1 capsule (15 mg total) by mouth 2 (two) times daily before a meal. Patient taking differently: Take 15 mg by mouth daily at 6 (six) AM. 05/03/17  Yes Bonnielee Haff, MD  levothyroxine (SYNTHROID) 200 MCG tablet Take 200 mcg by mouth daily at 6 (six) AM.   Yes [provider]  Menthol, Topical Analgesic, (BIOFREEZE) 4 % GEL Apply 1 application topically 2 (two) times daily.   Yes [provider]  Multiple Vitamin (MULTIVITAMIN WITH MINERALS) TABS tablet Take 1 tablet by mouth daily at 6 (six) AM.   Yes [provider]  Skin Protectants, Misc. (REMEDY CALAZIME/OLIVAMINE) PSTE Apply 1 application topically See admin instructions. Apply topically after each toileting to buttocks and perineal area   Yes [provider]  valsartan-hydrochlorothiazide (DIOVAN-HCT) 80-12.5 MG tablet Take 1 tablet  by mouth daily at 6 (six) AM.   Yes [provider]  vitamin C (ASCORBIC ACID) 500 MG tablet Take 500 mg by mouth daily at 6 (six) AM.   Yes [provider]    Physical Exam: Vitals:   03/29/21 1852 03/29/21 1854 03/29/21 1915 03/29/21 2041  BP:  (!) 96/44 100/60 98/74  Pulse: 62 69 69 74  Resp: (!) 21 19 17 18   Temp:    97.6 F (36.4 C)  TempSrc:    Oral  SpO2: 96% 94% 96% 99%   Physical Exam Constitutional:      General: She is not in acute distress.    Appearance: Normal appearance.  HENT:     Head: Normocephalic and atraumatic.     Mouth/Throat:     Mouth: Mucous membranes are moist.     Pharynx: Oropharynx is  clear.  Eyes:     Extraocular Movements: Extraocular movements intact.     Pupils: Pupils are equal, round, and reactive to light.  Cardiovascular:     Rate and Rhythm: Normal rate and regular rhythm.     Pulses: Normal pulses.     Heart sounds: Normal heart sounds.  Pulmonary:     Effort: Pulmonary effort is normal. No respiratory distress.     Breath sounds: Normal breath sounds.  Abdominal:     General: Bowel sounds are normal. There is no distension.     Palpations: Abdomen is soft.     Tenderness: There is no abdominal tenderness.  Musculoskeletal:        General: No swelling. Deformity: R Knee.     Comments: Right knee in immobilizer. Right lower extremity neurovascularly intact.  Able to wiggle toes, warm but difficult to palpate pulses.  No numbness or tingling.  Skin:    General: Skin is warm and dry.  Neurological:     General: No focal deficit present.     Mental Status: Mental status is at baseline.    Labs on Admission: I have personally reviewed following labs and imaging studies  CBC: Recent Labs  Lab 03/29/21 1749  WBC 14.8*  NEUTROABS 12.7*  HGB 9.9*  HCT 31.1*  MCV 101.3*  PLT 151    Basic Metabolic Panel: Recent Labs  Lab 03/29/21 1749  NA 137  K 4.3  CL 98  CO2 27  GLUCOSE 190*  BUN 19  CREATININE  1.14*  CALCIUM 8.0*    GFR: CrCl cannot be calculated (Unknown ideal weight.).  Liver Function Tests: No results for input(s): AST, ALT, ALKPHOS, BILITOT, PROT, ALBUMIN in the last 168 hours.  Urine analysis:    Component Value Date/Time   COLORURINE YELLOW 05/02/2017 1222   APPEARANCEUR HAZY (A) 05/02/2017 1222   LABSPEC 1.014 05/02/2017 1222   PHURINE 6.0 05/02/2017 1222   GLUCOSEU NEGATIVE 05/02/2017 1222   HGBUR MODERATE (A) 05/02/2017 1222   BILIRUBINUR NEGATIVE 05/02/2017 1222   KETONESUR NEGATIVE 05/02/2017 1222   PROTEINUR NEGATIVE 05/02/2017 1222   NITRITE POSITIVE (A) 05/02/2017 1222   LEUKOCYTESUR LARGE (A) 05/02/2017 1222    Radiological Exams on Admission: DG Chest 1 View  Result Date: 03/29/2021 CLINICAL DATA:  Fall. EXAM: CHEST  1 VIEW COMPARISON:  03/20/2018 FINDINGS: Normal cardiac silhouette. Linear atelectasis in the RIGHT midlung. Lungs are hyperinflated. No effusion infiltrate or pneumothorax identified. LEFT shoulder arthroplasty noted. IMPRESSION: 1. No evidence of thoracic trauma. 2. RIGHT lung linear atelectasis. Electronically Signed   By: Suzy Bouchard M.D.   On: 03/29/2021 17:35   DG Pelvis 1-2 Views  Result Date: 03/29/2021 CLINICAL DATA:  87 year old female with fall. EXAM: PELVIS - 1-2 VIEW COMPARISON:  None. FINDINGS: Evaluation is limited due to advanced osteopenia and superimposition of the right hand over the right femur trochanter. No acute fracture identified. There is no dislocation. Moderate left and advanced right hip arthritic changes. Degenerative changes of the lower lumbar spine. The soft tissues are unremarkable. IMPRESSION: No acute fracture or dislocation. Electronically Signed   By: Anner Crete M.D.   On: 03/29/2021 17:29   CT Head Wo Contrast  Result Date: 03/29/2021 CLINICAL DATA:  Fall.  Head trauma EXAM: CT HEAD WITHOUT CONTRAST TECHNIQUE: Contiguous axial images were obtained from the base of the skull through the  vertex without intravenous contrast. COMPARISON:  None. FINDINGS: Brain: No acute intracranial hemorrhage. No focal mass lesion. No CT  evidence of acute infarction. No midline shift or mass effect. No hydrocephalus. Basilar cisterns are patent. There are periventricular and subcortical white matter hypodensities. Generalized cortical atrophy. Vascular: No hyperdense vessel or unexpected calcification. Skull: Normal. Negative for fracture or focal lesion. Sinuses/Orbits: Paranasal sinuses and mastoid air cells are clear. Orbits are clear. Other: None. IMPRESSION: 1. No intracranial trauma. 2. Atrophy and white matter microvascular disease Electronically Signed   By: Suzy Bouchard M.D.   On: 03/29/2021 18:01   CT Lumbar Spine Wo Contrast  Result Date: 03/29/2021 CLINICAL DATA:  87 year old post fall when transferring from 1 chair to another. EXAM: CT LUMBAR SPINE WITHOUT CONTRAST TECHNIQUE: Multidetector CT imaging of the lumbar spine was performed without intravenous contrast administration. Multiplanar CT image reconstructions were also generated. COMPARISON:  None. FINDINGS: Segmentation: 5 lumbar type vertebrae.  S1 is partially lumbarized. Alignment: Broad-based dextroscoliotic curvature. 4 mm anterolisthesis of L5 on S1. Vertebrae: No acute fracture. No vertebral body compression. None fusion posterior elements of S1. No fracture of included sacrum. Bones diffusely under mineralized. Paraspinal and other soft tissues: No acute or traumatic findings. Fatty atrophy of paraspinal musculature. Aortic atherosclerosis. Disc levels: Diffuse degenerative disc disease with disc space narrowing and endplate spurring. There is multilevel facet hypertrophy. Mild bony canal narrowing at L4-L5. No high-grade canal stenosis. IMPRESSION: 1. No acute fracture of the lumbar spine. 2. Broad-based dextroscoliotic curvature with multilevel degenerative disc disease and facet hypertrophy. Aortic Atherosclerosis (ICD10-I70.0).  Electronically Signed   By: Keith Rake M.D.   On: 03/29/2021 17:58   DG Knee Complete 4 Views Right  Result Date: 03/29/2021 CLINICAL DATA:  Fall.  87 year old female EXAM: RIGHT KNEE - COMPLETE 4+ VIEW COMPARISON:  None. FINDINGS: Oblique fracture through the distal femoral metadiaphysis with medial displacement of the distal fracture fragment by 1 bone width. Mild comminution along the fracture plane which enters the proximal metaphysis. IMPRESSION: Oblique comminuted displaced fracture of the distal femur as above. Electronically Signed   By: Suzy Bouchard M.D.   On: 03/29/2021 17:29   DG FEMUR, MIN 2 VIEWS RIGHT  Result Date: 03/29/2021 CLINICAL DATA:  87 year old female with fall and trauma to the right lower extremity. EXAM: RIGHT FEMUR 2 VIEWS COMPARISON:  Pelvic radiograph dated 03/29/2021. FINDINGS: There is a comminuted oblique fracture of the distal femoral diaphysis. There is medial and anterior angulation of the major distal fracture fragment. No dislocation. There is advanced osteopenia with severe degenerative changes of the right knee. There is soft tissue swelling of the anterior soft tissues above the knee with probable small suprapatellar effusion. Vascular calcification. IMPRESSION: Comminuted, displaced and angulated, oblique fracture of the distal femoral diaphysis. Electronically Signed   By: Anner Crete M.D.   On: 03/29/2021 17:32   EKG: Independently reviewed.  Atrial fibrillation at 74 bpm.  Significant baseline artifact.  Intraventricular conduction delay nonspecific.  Assessment/Plan Principal Problem:   Closed fracture of right distal femur (HCC) Active Problems:   Chronic duodenal ulcer   GERD (gastroesophageal reflux disease)   Hypothyroidism   Hypertension   Renal insufficiency   Leukocytosis   Chronic pain   Diabetes mellitus type 2 in nonobese (HCC)   Chronic anemia   Iron deficiency anemia  Closed fracture of right distal femur > Fell when  transferring from 1 chair to another. > X-ray showing comminuted distal femur fracture > Has a leukocytosis of 14.8 likely reactive due to this (chest x-ray looks okay, checking urine) > Orthopedics consulted in the ED -  Femur fracture protocol, for pain and positioning - Ortho has ordered TXA - Pain control as needed with hydrocodone for moderate pain and Dilaudid for severe  Recent DVT > Diagnosed and started on Eliquis 3 weeks ago - SCDs for now in setting of upcoming surgery and anemia, continue to monitor  Anemia > History of iron deficiency anemia > Hemoglobin in the ED was 9.9 which is similar to her previous in the past however she did have a hemoglobin of 12 3 weeks ago. > May be having some bleeding associated with her femur fracture, will monitor CBC closely has already had type and screen performed. > Blood pressure is soft with this in the setting of receiving fentanyl. > History of iron deficiency anemia current MCV is elevated. - Trend CBC once overnight and again in the morning - Check B12 and folate  GERD Chronic duodenal ulcer - Continue PPI  Chronic pain - Receiving pain control for fracture as above  Diabetes - SSI  Hypertension > Holding home antihypertensives in the setting of soft blood pressure likely due to pain medication, but worsening anemia/blood loss from fracture also possibility. - Holding home antihypertensives  Hypothyroidism - Continue home Synthroid  CKD > Creatinine stable at 1.14 in the ED - Avoid nephrotoxic agents - Trend renal function and electrolytes  DVT prophylaxis: SCDs  Code Status:   DNR  Family Communication:  Discussed with family at bedside.  States they are waiting to discuss with patient's son as well.  Disposition Plan:   Patient is from:  Assisted living  Anticipated DC to:  Pending clinical course  Anticipated DC date:  2 to 4 days  Anticipated DC barriers: None  Consults called:  Orthopedics called in the ED   Admission status:  Inpatient, telemetry   Severity of Illness: The appropriate patient status for this patient is OBSERVATION. Observation status is judged to be reasonable and necessary in order to provide the required intensity of service to ensure the patient's safety. The patient's presenting symptoms, physical exam findings, and initial radiographic and laboratory data in the context of their medical condition is felt to place them at decreased risk for further clinical deterioration. Furthermore, it is anticipated that the patient will be medically stable for discharge from the hospital within 2 midnights of admission. The following factors support the patient status of observation.   " The patient's presenting symptoms include fall, femur fracture. " The physical exam findings include immobilizer in place, murmur. " The initial radiographic and laboratory data are right knee and femur x-ray with comminuted distal femur fracture.  No acute abnormality on pelvis x-ray, CT head, CT L-spine.  Chest x-ray with right letter atelectasis.   Marcelyn Bruins MD [facility]  How to contact the [facility] Attending or Consulting provider [place] or covering provider during after hours [place], for this patient?   1. Check the care team in [facility] and look for a) attending/consulting [facility] provider listed and b) the [facility] team listed 2. Log into [email or web address] and use [place]'s universal password to access. If you do not have the password, please contact the hospital operator. 3. Locate the [facility] provider you are looking for under [facility] and page to a number that you can be directly reached. 4. If you still have difficulty reaching the provider, please page the [facility] (Director on Call) for the Hospitalists listed on amion for assistance.  03/29/2021, 9:18 PM

## 2021-03-29 NOTE — Progress Notes (Signed)
Orthopedic Tech Progress Note Patient Details:  Casey Watts September 07, 1924 [id number]  Ortho Devices Type of Ortho Device: Knee Immobilizer Ortho Device/Splint Location: Right Lower Extremity Ortho Device/Splint Interventions: Ordered,Application,Adjustment   Post Interventions Patient Tolerated: Well Instructions Provided: Adjustment of device,Care of device,[place]oper ambulation with device   Tammy Sours 03/29/2021, 6:57 PM

## 2021-03-29 NOTE — ED Provider Notes (Signed)
[name] EMERGENCY DEPARTMENT Provider Note   CSN: [id number] Arrival date & time: 03/29/21  1615     History Chief Complaint  Patient presents with  . Fall    Casey Watts is a 87 y.o. female.  Patient presents the ER chief complaint of right-sided leg pain.  She states that she was transferring from 1 chair to another when she lost her balance and fell twisted her right knee.  Had significant pain in the right lower extremity unable to weight-bear and brought to the ER for further evaluation.  Patient recently started on Eliquis for DVT just a few days ago and she has been taking this.  Otherwise denies pain elsewhere.  Patient otherwise has no reported fevers or cough or vomiting or diarrhea.        Past Medical History:  Diagnosis Date  . Anemia   . Arthritis   . Asthma    as a child  . Cancer ([place])    skin cancer on left leg  . Cataract   . Cellulitis and abscess of lower extremity 03/21/2018  . Diabetes mellitus    TYPE 2  . Esophageal stricture   . GERD (gastroesophageal reflux disease)   . Glaucoma   . Hypertension   . Hypothyroidism   . Thyroid disease     Patient Active Problem List   Diagnosis Date Noted  . Acute respiratory failure with hypoxia ([place]) 04/01/2018  . Chronic anemia 04/01/2018  . Iron deficiency anemia 04/01/2018  . Pressure injury of skin 03/16/2018  . Lactic acidosis 03/16/2018  . Diabetes mellitus type 2 in nonobese ([place]) 03/16/2018  . Cellulitis of both lower extremities 03/15/2018  . Chest pain 05/02/2017  . Diabetes mellitus with complication ([place]) [id number]  . Renal insufficiency 05/02/2017  . Leukocytosis 05/02/2017  . Chronic pain 05/02/2017  . Atypical chest pain   . GERD (gastroesophageal reflux disease) 02/13/2014  . Constipation 02/13/2014  . Hypothyroidism 02/13/2014  . Hypertension 02/13/2014  . Proximal humeral fracture 02/04/2014  . Stricture and stenosis of esophagus 02/28/2011  .  Chronic duodenal ulcer without mention of hemorrhage, perforation, or obstruction 02/28/2011  . DM 01/19/2011    Past Surgical History:  Procedure Laterality Date  . APPENDECTOMY    . CATARACT EXTRACTION Left   . EYE SURGERY    . FOOT SURGERY Bilateral    Bunionectomy  . REVERSE SHOULDER ARTHROPLASTY Left 02/04/2014   DR MURPHY  . REVERSE SHOULDER ARTHROPLASTY Left 02/04/2014   Procedure: REVERSE SHOULDER ARTHROPLASTY;  Surgeon: Renette Butters, MD;  Location: [facility];  Service: Orthopedics;  Laterality: Left;     OB History   No obstetric history on file.     Family History  Problem Relation Age of Onset  . Heart disease Brother   . Colon cancer Neg Hx   . Cancer Neg Hx     Social History   Tobacco Use  . Smoking status: Never Smoker  . Smokeless tobacco: Never Used  Vaping Use  . Vaping Use: Never used  Substance Use Topics  . Alcohol use: Yes    Comment: occasional  . Drug use: No    Home Medications Prior to Admission medications   Medication Sig Start Date End Date Taking? Authorizing Provider  acetaminophen (TYLENOL) 650 MG CR tablet Take 650 mg by mouth See admin instructions. Take one tablet (650 mg) by mouth twice daily, may also take one tablet (650 mg) daily as needed for pain  Yes [provider]  Amino Acids-Protein Hydrolys (FEEDING SUPPLEMENT, PRO-STAT SUGAR FREE 64,) LIQD Take 30 mLs by mouth See admin instructions. Take 30 ml by mouth twice daily - 10am and 2pm ([place])   Yes [provider]  apixaban (ELIQUIS) 5 MG TABS tablet Take 5 mg by mouth 2 (two) times daily.   Yes [provider]  cholecalciferol (VITAMIN D) 1000 UNITS tablet Take 1,000 Units by mouth daily at 6 (six) AM.   Yes [provider]  docusate sodium (COLACE) 100 MG capsule Take 1 capsule (100 mg total) by mouth 2 (two) times daily as needed for mild constipation. 01/23/14  Yes Kingsley Spittle, MD  Emollient (AQUAPHOR EX) Apply 1 application topically  See admin instructions. Apply topically daily to both legs prior to ted hose placement (41% ointment)   Yes [provider]  ferrous sulfate 325 (65 FE) MG tablet Take 1 tablet (325 mg total) by mouth daily with breakfast. Patient taking differently: Take 325 mg by mouth daily at 6 (six) AM. 03/23/18  Yes Ogbata, Babs Bertin, MD  furosemide (LASIX) 20 MG tablet Take 10 mg by mouth daily at 6 (six) AM.   Yes [provider]  glimepiride (AMARYL) 1 MG tablet Take 1 mg by mouth daily at 6 (six) AM.   Yes [provider]  lansoprazole (PREVACID) 15 MG capsule Take 1 capsule (15 mg total) by mouth 2 (two) times daily before a meal. Patient taking differently: Take 15 mg by mouth daily at 6 (six) AM. 05/03/17  Yes Bonnielee Haff, MD  levothyroxine (SYNTHROID) 200 MCG tablet Take 200 mcg by mouth daily at 6 (six) AM.   Yes [provider]  Menthol, Topical Analgesic, (BIOFREEZE) 4 % GEL Apply 1 application topically 2 (two) times daily.   Yes [provider]  Multiple Vitamin (MULTIVITAMIN WITH MINERALS) TABS tablet Take 1 tablet by mouth daily at 6 (six) AM.   Yes [provider]  Skin Protectants, Misc. (REMEDY CALAZIME/OLIVAMINE) PSTE Apply 1 application topically See admin instructions. Apply topically after each toileting to buttocks and perineal area   Yes [provider]  valsartan-hydrochlorothiazide (DIOVAN-HCT) 80-12.5 MG tablet Take 1 tablet by mouth daily at 6 (six) AM.   Yes [provider]  vitamin C (ASCORBIC ACID) 500 MG tablet Take 500 mg by mouth daily at 6 (six) AM.   Yes [provider]    Allergies    Patient has no known allergies.  Review of Systems   Review of Systems  Constitutional: Negative for fever.  HENT: Negative for ear pain.   Eyes: Negative for pain.  Respiratory: Negative for cough.   Cardiovascular: Negative for chest pain.  Gastrointestinal: Negative for abdominal pain.   Genitourinary: Negative for flank pain.  Musculoskeletal: Negative for back pain.  Skin: Negative for rash.  Neurological: Negative for headaches.    Physical Exam Updated Vital Signs BP 100/60   Pulse 69   Resp 17   SpO2 96%   Physical Exam Constitutional:      General: She is not in acute distress.    Appearance: Normal appearance.  HENT:     Head: Normocephalic.     Nose: Nose normal.  Eyes:     Extraocular Movements: Extraocular movements intact.  Cardiovascular:     Rate and Rhythm: Normal rate.  Pulmonary:     Effort: Pulmonary effort is normal.  Musculoskeletal:     Cervical back: Normal range of motion.  Comments: Swelling, ecchymosis tenderness palpation of the right knee deformity present in the right knee region.  Neurovascularly intact distally otherwise.   Neurological:     General: No focal deficit present.     Mental Status: She is alert. Mental status is at baseline.     ED Results / Procedures / Treatments   Labs (all labs ordered are listed, but only abnormal results are displayed) Labs Reviewed  CBC WITH DIFFERENTIAL/PLATELET - Abnormal; Notable for the following components:      Result Value   WBC 14.8 (*)    RBC 3.07 (*)    Hemoglobin 9.9 (*)    HCT 31.1 (*)    MCV 101.3 (*)    Neutro Abs 12.7 (*)    Monocytes Absolute 1.3 (*)    Abs Immature Granulocytes 0.10 (*)    All other components within normal limits  BASIC METABOLIC PANEL - Abnormal; Notable for the following components:   Glucose, Bld 190 (*)    Creatinine, Ser 1.14 (*)    Calcium 8.0 (*)    GFR, Estimated 44 (*)    All other components within normal limits  RESP PANEL BY RT-PCR (FLU A&B, COVID) [id number]  TYPE AND SCREEN    EKG EKG Interpretation  Date/Time:  Monday March 29 2021 16:15:18 EDT Ventricular Rate:  74 PR Interval:    QRS Duration: 165 QT Interval:  418 QTC Calculation: 442 R Axis:   -26 Text Interpretation: Atrial fibrillation Ventricular premature  complex IVCD, consider atypical RBBB Artifact in lead(s) I II III aVR aVL aVF V1 V2 V6 Confirmed by Thamas Jaegers ([id number]) on 03/29/2021 4:29:38 PM   Radiology DG Chest 1 View  Result Date: 03/29/2021 CLINICAL DATA:  Fall. EXAM: CHEST  1 VIEW COMPARISON:  03/20/2018 FINDINGS: Normal cardiac silhouette. Linear atelectasis in the RIGHT midlung. Lungs are hyperinflated. No effusion infiltrate or pneumothorax identified. LEFT shoulder arthroplasty noted. IMPRESSION: 1. No evidence of thoracic trauma. 2. RIGHT lung linear atelectasis. Electronically Signed   By: Suzy Bouchard M.D.   On: 03/29/2021 17:35   DG Pelvis 1-2 Views  Result Date: 03/29/2021 CLINICAL DATA:  87 year old female with fall. EXAM: PELVIS - 1-2 VIEW COMPARISON:  None. FINDINGS: Evaluation is limited due to advanced osteopenia and superimposition of the right hand over the right femur trochanter. No acute fracture identified. There is no dislocation. Moderate left and advanced right hip arthritic changes. Degenerative changes of the lower lumbar spine. The soft tissues are unremarkable. IMPRESSION: No acute fracture or dislocation. Electronically Signed   By: Anner Crete M.D.   On: 03/29/2021 17:29   CT Head Wo Contrast  Result Date: 03/29/2021 CLINICAL DATA:  Fall.  Head trauma EXAM: CT HEAD WITHOUT CONTRAST TECHNIQUE: Contiguous axial images were obtained from the base of the skull through the vertex without intravenous contrast. COMPARISON:  None. FINDINGS: Brain: No acute intracranial hemorrhage. No focal mass lesion. No CT evidence of acute infarction. No midline shift or mass effect. No hydrocephalus. Basilar cisterns are patent. There are periventricular and subcortical white matter hypodensities. Generalized cortical atrophy. Vascular: No hyperdense vessel or unexpected calcification. Skull: Normal. Negative for fracture or focal lesion. Sinuses/Orbits: Paranasal sinuses and mastoid air cells are clear. Orbits are clear. Other:  None. IMPRESSION: 1. No intracranial trauma. 2. Atrophy and white matter microvascular disease Electronically Signed   By: Suzy Bouchard M.D.   On: 03/29/2021 18:01   CT Lumbar Spine Wo Contrast  Result Date: 03/29/2021 CLINICAL DATA:  87 year old post  fall when transferring from 1 chair to another. EXAM: CT LUMBAR SPINE WITHOUT CONTRAST TECHNIQUE: Multidetector CT imaging of the lumbar spine was performed without intravenous contrast administration. Multiplanar CT image reconstructions were also generated. COMPARISON:  None. FINDINGS: Segmentation: 5 lumbar type vertebrae.  S1 is partially lumbarized. Alignment: Broad-based dextroscoliotic curvature. 4 mm anterolisthesis of L5 on S1. Vertebrae: No acute fracture. No vertebral body compression. None fusion posterior elements of S1. No fracture of included sacrum. Bones diffusely under mineralized. Paraspinal and other soft tissues: No acute or traumatic findings. Fatty atrophy of paraspinal musculature. Aortic atherosclerosis. Disc levels: Diffuse degenerative disc disease with disc space narrowing and endplate spurring. There is multilevel facet hypertrophy. Mild bony canal narrowing at L4-L5. No high-grade canal stenosis. IMPRESSION: 1. No acute fracture of the lumbar spine. 2. Broad-based dextroscoliotic curvature with multilevel degenerative disc disease and facet hypertrophy. Aortic Atherosclerosis (ICD10-I70.0). Electronically Signed   By: Keith Rake M.D.   On: 03/29/2021 17:58   DG Knee Complete 4 Views Right  Result Date: 03/29/2021 CLINICAL DATA:  Fall.  87 year old female EXAM: RIGHT KNEE - COMPLETE 4+ VIEW COMPARISON:  None. FINDINGS: Oblique fracture through the distal femoral metadiaphysis with medial displacement of the distal fracture fragment by 1 bone width. Mild comminution along the fracture plane which enters the proximal metaphysis. IMPRESSION: Oblique comminuted displaced fracture of the distal femur as above. Electronically  Signed   By: Suzy Bouchard M.D.   On: 03/29/2021 17:29   DG FEMUR, MIN 2 VIEWS RIGHT  Result Date: 03/29/2021 CLINICAL DATA:  87 year old female with fall and trauma to the right lower extremity. EXAM: RIGHT FEMUR 2 VIEWS COMPARISON:  Pelvic radiograph dated 03/29/2021. FINDINGS: There is a comminuted oblique fracture of the distal femoral diaphysis. There is medial and anterior angulation of the major distal fracture fragment. No dislocation. There is advanced osteopenia with severe degenerative changes of the right knee. There is soft tissue swelling of the anterior soft tissues above the knee with probable small suprapatellar effusion. Vascular calcification. IMPRESSION: Comminuted, displaced and angulated, oblique fracture of the distal femoral diaphysis. Electronically Signed   By: Anner Crete M.D.   On: 03/29/2021 17:32    Procedures Procedures   Medications Ordered in ED Medications  sodium chloride 0.9 % bolus 500 mL (has no administration in time range)  fentaNYL (SUBLIMAZE) injection 25 mcg (25 mcg Intravenous Given 03/29/21 1740)  sodium chloride 0.9 % bolus 500 mL (0 mLs Intravenous Stopped 03/29/21 2015)  fentaNYL (SUBLIMAZE) injection 25 mcg (25 mcg Intravenous Given 03/29/21 1912)  tranexamic acid (CYKLOKAPRON) IVPB 1,000 mg (0 mg Intravenous Stopped 03/29/21 2015)    ED Course  I have reviewed the triage vital signs and the nursing notes.  Pertinent labs & imaging results that were available during my care of the patient were reviewed by me and considered in my medical decision making (see chart for details).    MDM Rules/Calculators/A&P                          Images of the right lower extremity consistent with right distal femur fracture appears comminuted.  CT imaging of the head and L-spine otherwise unremarkable. Patient has a low blood pressure around 75-10 systolic.  Given IV fentanyl without any significant decrease in blood pressure.  Orthopedic surgery  consulted, recommended tranexamic acid infusion and admission to medicine with n.p.o. status after midnight.  Family made aware and medicine consultation requested for admission.  Final Clinical Impression(s) / ED Diagnoses Final diagnoses:  Closed comminuted intra-articular fracture of distal end of right femur, initial encounter [facility])    Rx / [place] Orders ED Discharge Orders    None       Luna Fuse, MD 03/29/21 2019

## 2021-03-29 NOTE — ED Triage Notes (Signed)
Pt bib [facility] from an assisted living. Per EMS, Pt fell from transferring one chair to another. Right knee deformity is noted. 61mcg Fentanyl was given en route which has dropped pt's bp from 106/42 to 80/60. Pt c/o back pain and right knee pain 8/10. Pt is alert & oriented *4   90/42 Hr 60  RR 28  94% RA

## 2021-03-29 NOTE — ED Notes (Signed)
Right pedal pulse was not found using a doppler. MD made aware.

## 2021-03-30 DIAGNOSIS — S72401A Unspecified fracture of lower end of right femur, initial encounter for closed fracture: Secondary | ICD-10-CM | POA: Diagnosis not present

## 2021-03-30 LAB — BASIC METABOLIC PANEL
Anion gap: 6 (ref 5–15)
BUN: 20 mg/dL (ref 8–23)
CO2: 30 mmol/L (ref 22–32)
Calcium: 7.9 mg/dL — ABNORMAL LOW (ref 8.9–10.3)
Chloride: 101 mmol/L (ref 98–111)
Creatinine, Ser: 1.12 mg/dL — ABNORMAL HIGH (ref 0.44–1.00)
GFR, Estimated: 45 mL/min — ABNORMAL LOW (ref >60)
Glucose, Bld: 122 mg/dL — ABNORMAL HIGH (ref 70–99)
Potassium: 4 mmol/L (ref 3.5–5.1)
Sodium: 137 mmol/L (ref 135–145)

## 2021-03-30 LAB — GLUCOSE, CAPILLARY
Glucose-Capillary: 128 mg/dL — ABNORMAL HIGH (ref 70–99)
Glucose-Capillary: 89 mg/dL (ref 70–99)
Glucose-Capillary: 141 mg/dL — ABNORMAL HIGH (ref 70–99)
Glucose-Capillary: 108 mg/dL — ABNORMAL HIGH (ref 70–99)

## 2021-03-30 LAB — CBC
HCT: 26.7 % — ABNORMAL LOW (ref 36.0–46.0)
Hemoglobin: 8.6 g/dL — ABNORMAL LOW (ref 12.0–15.0)
MCH: 32.2 pg (ref 26.0–34.0)
MCHC: 32.2 g/dL (ref 30.0–36.0)
MCV: 100 fL (ref 80.0–100.0)
Platelets: 223 10*3/uL (ref 150–400)
RBC: 2.67 MIL/uL — ABNORMAL LOW (ref 3.87–5.11)
RDW: 14.1 % (ref 11.5–15.5)
WBC: 12 10*3/uL — ABNORMAL HIGH (ref 4.0–10.5)
nRBC: 0 % (ref 0.0–0.2)

## 2021-03-30 LAB — HEMOGLOBIN A1C
Hgb A1c MFr Bld: 5.8 % — ABNORMAL HIGH (ref 4.8–5.6)
Mean Plasma Glucose: 119.76 mg/dL

## 2021-03-30 LAB — SURGICAL PCR SCREEN
MRSA, PCR: NEGATIVE
Staphylococcus aureus: NEGATIVE

## 2021-03-30 LAB — FOLATE: Folate: 35.8 ng/mL (ref >5.9)

## 2021-03-30 LAB — VITAMIN B12: Vitamin B-12: 553 pg/mL (ref 180–914)

## 2021-03-30 MED ORDER — CEFAZOLIN SODIUM-DEXTROSE 2-4 GM/100ML-% IV SOLN
2.0000 g | INTRAVENOUS | Status: AC
Start: 2021-03-31 — End: 2021-03-31
  Administered 2021-03-31: 2 g via INTRAVENOUS
  Filled 2021-03-30: qty 100

## 2021-03-30 MED ORDER — SODIUM CHLORIDE 0.9 % IV BOLUS
1000.0000 mL | Freq: Once | INTRAVENOUS | Status: AC
  Administered 2021-03-30: 1000 mL via INTRAVENOUS

## 2021-03-30 MED ORDER — LACTATED RINGERS IV BOLUS: 250.0000 mL | Freq: Once | INTRAVENOUS | Status: DC

## 2021-03-30 MED ORDER — INSULIN ASPART 100 UNIT/ML ~~LOC~~ SOLN
0.0000 [IU] | Freq: Three times a day (TID) | SUBCUTANEOUS | Status: DC
  Administered 2021-04-01: 1 [IU] via SUBCUTANEOUS
  Administered 2021-04-03 – 2021-04-05 (×6): 2 [IU] via SUBCUTANEOUS
  Administered 2021-04-05: 1 [IU] via SUBCUTANEOUS
  Administered 2021-04-06: 2 [IU] via SUBCUTANEOUS

## 2021-03-30 MED ORDER — LACTATED RINGERS IV BOLUS
250.0000 mL | Freq: Once | INTRAVENOUS | Status: AC
  Administered 2021-03-30: 250 mL via INTRAVENOUS

## 2021-03-30 MED ORDER — CHLORHEXIDINE GLUCONATE 4 % EX LIQD
60.0000 mL | Freq: Once | CUTANEOUS | Status: AC
  Administered 2021-03-31: 4 via TOPICAL
  Filled 2021-03-30: qty 15

## 2021-03-30 MED ORDER — POVIDONE-IODINE 10 % EX SWAB: 2.0000 "application " | Freq: Once | CUTANEOUS | Status: DC

## 2021-03-30 MED ORDER — ACETAMINOPHEN 325 MG PO TABS
650.0000 mg | ORAL_TABLET | ORAL | Status: DC | PRN
  Administered 2021-03-30 – 2021-04-03 (×3): 650 mg via ORAL
  Filled 2021-03-30 (×3): qty 2

## 2021-03-30 MED ORDER — LACTATED RINGERS IV SOLN: INTRAVENOUS | Status: AC

## 2021-03-30 NOTE — Consult Note (Signed)
Reason for Consult:Right distal femur fx Referring Physician: A Akhikari Time called: 0809 Time at bedside: 1041   Casey Watts is an 87 y.o. female.  HPI: Casey Watts got up to walk last night and fell. She is not ambulatory at baseline and hasn't walked in 3 years. She had immediate right leg pain and was brought to the ED. X-rays showed a distal femur fx and orthopedic surgery was consulted. She lives in a SNF. Pt has some dementia which waxes and wanes and most history was provided my DIL. Family is unsure about surgery.  Past Medical History:  Diagnosis Date  . Anemia   . Arthritis   . Asthma    as a child  . Cancer (HCC)    skin cancer on left leg  . Cataract   . Cellulitis and abscess of lower extremity 03/21/2018  . Diabetes mellitus    TYPE 2  . Esophageal stricture   . GERD (gastroesophageal reflux disease)   . Glaucoma   . Hypertension   . Hypothyroidism   . Thyroid disease     Past Surgical History:  Procedure Laterality Date  . APPENDECTOMY    . CATARACT EXTRACTION Left   . EYE SURGERY    . FOOT SURGERY Bilateral    Bunionectomy  . REVERSE SHOULDER ARTHROPLASTY Left 02/04/2014   DR MURPHY  . REVERSE SHOULDER ARTHROPLASTY Left 02/04/2014   Procedure: REVERSE SHOULDER ARTHROPLASTY;  Surgeon: Timothy D Murphy, MD;  Location: [facility] OR;  Service: Orthopedics;  Laterality: Left;    Family History  Problem Relation Age of Onset  . Heart disease Brother   . Colon cancer Neg Hx   . Cancer Neg Hx     Social History:  reports that she has never smoked. She has never used smokeless tobacco. She reports current alcohol use. She reports that she does not use drugs.  Allergies: No Known Allergies  Medications: I have reviewed the patient's current medications.  Results for orders placed or performed during the hospital encounter of 03/29/21 (from the past 48 hour(s))  CBC with Differential     Status: Abnormal   Collection Time: 03/29/21  5:49 PM  Result Value Ref  Range   WBC 14.8 (H) 4.0 - 10.5 K/uL   RBC 3.07 (L) 3.87 - 5.11 MIL/uL   Hemoglobin 9.9 (L) 12.0 - 15.0 g/dL   HCT 31.1 (L) 36.0 - 46.0 %   MCV 101.3 (H) 80.0 - 100.0 fL   MCH 32.2 26.0 - 34.0 pg   MCHC 31.8 30.0 - 36.0 g/dL   RDW 14.0 11.5 - 15.5 %   Platelets 269 150 - 400 K/uL   nRBC 0.0 0.0 - 0.2 %   Neutrophils Relative % 85 %   Neutro Abs 12.7 (H) 1.7 - 7.7 K/uL   Lymphocytes Relative 5 %   Lymphs Abs 0.7 0.7 - 4.0 K/uL   Monocytes Relative 9 %   Monocytes Absolute 1.3 (H) 0.1 - 1.0 K/uL   Eosinophils Relative 0 %   Eosinophils Absolute 0.0 0.0 - 0.5 K/uL   Basophils Relative 0 %   Basophils Absolute 0.0 0.0 - 0.1 K/uL   Immature Granulocytes 1 %   Abs Immature Granulocytes 0.10 (H) 0.00 - 0.07 K/uL    Comment: Performed at [facility], [address] , [place] [address]  Basic metabolic panel     Status: Abnormal   Collection Time: 03/29/21  5:49 PM  Result Value Ref Range     Sodium 137 135 - 145 mmol/L   Potassium 4.3 3.5 - 5.1 mmol/L   Chloride 98 98 - 111 mmol/L   CO2 27 22 - 32 mmol/L   Glucose, Bld 190 (H) 70 - 99 mg/dL    Comment: Glucose reference range applies only to samples taken after fasting for at least 8 hours.   BUN 19 8 - 23 mg/dL   Creatinine, Ser 1.14 (H) 0.44 - 1.00 mg/dL   Calcium 8.0 (L) 8.9 - 10.3 mg/dL   GFR, Estimated 44 (L) >60 mL/min    Comment: (NOTE) Calculated using the CKD-EPI Creatinine Equation (2021)    Anion gap 12 5 - 15    Comment: Performed at [facility] [address] [place], [place] [address]  Resp Panel by RT-PCR (Flu A&B, Covid) Nasopharyngeal Swab     Status: None   Collection Time: 03/29/21  5:49 PM   Specimen: Nasopharyngeal Swab; Nasopharyngeal(NP) swabs in vial transport medium  Result Value Ref Range   SARS Coronavirus 2 by RT PCR NEGATIVE NEGATIVE    Comment: (NOTE) SARS-CoV-2 target nucleic acids are NOT DETECTED.  The SARS-CoV-2 RNA is generally detectable in upper  respiratory specimens during the acute phase of infection. The lowest concentration of SARS-CoV-2 viral copies this assay can detect is 138 copies/mL. A negative result does not preclude SARS-Cov-2 infection and should not be used as the sole basis for treatment or other patient management decisions. A negative result may occur with  improper specimen collection/handling, submission of specimen other than nasopharyngeal swab, presence of viral mutation(s) within the areas targeted by this assay, and inadequate number of viral copies(<138 copies/mL). A negative result must be combined with clinical observations, patient history, and epidemiological information. The expected result is Negative.  Fact Sheet for Patients:  EntrepreneurPulse.com.au  Fact Sheet for Healthcare Providers:  IncredibleEmployment.be  This test is no t yet approved or cleared by the [place] FDA and  has been authorized for detection and/or diagnosis of SARS-CoV-2 by FDA under an Emergency Use Authorization (EUA). This EUA will remain  in effect (meaning this test can be used) for the duration of the COVID-19 declaration under Section 564(b)(1) of the Act, 21 U.S.C.section 360bbb-3(b)(1), unless the authorization is terminated  or revoked sooner.       Influenza A by PCR NEGATIVE NEGATIVE   Influenza B by PCR NEGATIVE NEGATIVE    Comment: (NOTE) The Xpert Xpress SARS-CoV-2/FLU/RSV plus assay is intended as an aid in the diagnosis of influenza from Nasopharyngeal swab specimens and should not be used as a sole basis for treatment. Nasal washings and aspirates are unacceptable for Xpert Xpress SARS-CoV-2/FLU/RSV testing.  Fact Sheet for Patients: EntrepreneurPulse.com.au  Fact Sheet for Healthcare Providers: IncredibleEmployment.be  This test is not yet approved or cleared by the [place] FDA and has been authorized for  detection and/or diagnosis of SARS-CoV-2 by FDA under an Emergency Use Authorization (EUA). This EUA will remain in effect (meaning this test can be used) for the duration of the COVID-19 declaration under Section 564(b)(1) of the Act, 21 U.S.C. section 360bbb-3(b)(1), unless the authorization is terminated or revoked.  Performed at [facility], [place] [address], [place], [place] [address]   Type and screen [place]     Status: None   Collection Time: 03/29/21  6:24 PM  Result Value Ref Range   ABO/RH(D) O POS    Antibody Screen NEG    Sample Expiration  04/01/2021,2359 Performed at [facility] [address], [place], [place] [address]   Glucose, capillary     Status: Abnormal   Collection Time: 03/29/21  9:51 PM  Result Value Ref Range   Glucose-Capillary 190 (H) 70 - 99 mg/dL    Comment: Glucose reference range applies only to samples taken after fasting for at least 8 hours.  Surgical pcr screen     Status: None   Collection Time: 03/30/21 12:50 AM   Specimen: Nasal Mucosa; Nasal Swab  Result Value Ref Range   MRSA, PCR NEGATIVE NEGATIVE   Staphylococcus aureus NEGATIVE NEGATIVE    Comment: (NOTE) The Xpert SA Assay (FDA approved for NASAL specimens in patients 13 years of age and older), is one component of a comprehensive surveillance program. It is not intended to diagnose infection nor to guide or monitor treatment. Performed at [facility], [place] [address] [place], [place] [address]   Basic metabolic panel     Status: Abnormal   Collection Time: 03/30/21  1:31 AM  Result Value Ref Range   Sodium 137 135 - 145 mmol/L   Potassium 4.0 3.5 - 5.1 mmol/L   Chloride 101 98 - 111 mmol/L   CO2 30 22 - 32 mmol/L   Glucose, Bld 122 (H) 70 - 99 mg/dL    Comment: Glucose reference range applies only to samples taken after fasting for at least 8 hours.   BUN 20 8 - 23 mg/dL   Creatinine, Ser 1.12 (H) 0.44 - 1.00 mg/dL   Calcium  7.9 (L) 8.9 - 10.3 mg/dL   GFR, Estimated 45 (L) >60 mL/min    Comment: (NOTE) Calculated using the CKD-EPI Creatinine Equation (2021)    Anion gap 6 5 - 15    Comment: Performed at [facility] [address] [place], [place] [address]  CBC     Status: Abnormal   Collection Time: 03/30/21  1:31 AM  Result Value Ref Range   WBC 12.0 (H) 4.0 - 10.5 K/uL   RBC 2.67 (L) 3.87 - 5.11 MIL/uL   Hemoglobin 8.6 (L) 12.0 - 15.0 g/dL   HCT 26.7 (L) 36.0 - 46.0 %   MCV 100.0 80.0 - 100.0 fL   MCH 32.2 26.0 - 34.0 pg   MCHC 32.2 30.0 - 36.0 g/dL   RDW 14.1 11.5 - 15.5 %   Platelets 223 150 - 400 K/uL   nRBC 0.0 0.0 - 0.2 %    Comment: Performed at [facility], [place] [address] [place], [place] [address]  Hemoglobin A1c     Status: Abnormal   Collection Time: 03/30/21  1:31 AM  Result Value Ref Range   Hgb A1c MFr Bld 5.8 (H) 4.8 - 5.6 %    Comment: (NOTE) Pre diabetes:          5.7%-6.4%  Diabetes:              >6.4%  Glycemic control for   <7.0% adults with diabetes    Mean Plasma Glucose 119.76 mg/dL    Comment: Performed at [facility] [address] [place], [place] [address]  Vitamin B12     Status: None   Collection Time: 03/30/21  1:31 AM  Result Value Ref Range   Vitamin B-12 553 180 - 914 pg/mL    Comment: (NOTE) This assay is not validated for testing neonatal or myeloproliferative syndrome specimens for Vitamin B12 levels. Performed at [facility]  [facility], [place] [address], [place], [place] [address]   Folate, serum, performed at [facility]     Status: None   Collection Time: 03/30/21  1:31 AM  Result Value Ref Range   Folate 35.8 >5.9 ng/mL    Comment: Performed at [facility], [place] [address] [place], [place] [address]  Glucose, capillary     Status: Abnormal   Collection Time: 03/30/21  3:01 AM  Result Value Ref Range   Glucose-Capillary 128 (H) 70 - 99 mg/dL    Comment: Glucose reference range applies only to samples taken after  fasting for at least 8 hours.    DG Chest 1 View  Result Date: 03/29/2021 CLINICAL DATA:  Fall. EXAM: CHEST  1 VIEW COMPARISON:  03/20/2018 FINDINGS: Normal cardiac silhouette. Linear atelectasis in the RIGHT midlung. Lungs are hyperinflated. No effusion infiltrate or pneumothorax identified. LEFT shoulder arthroplasty noted. IMPRESSION: 1. No evidence of thoracic trauma. 2. RIGHT lung linear atelectasis. Electronically Signed   By: Suzy Bouchard M.D.   On: 03/29/2021 17:35   DG Pelvis 1-2 Views  Result Date: 03/29/2021 CLINICAL DATA:  87 year old female with fall. EXAM: PELVIS - 1-2 VIEW COMPARISON:  None. FINDINGS: Evaluation is limited due to advanced osteopenia and superimposition of the right hand over the right femur trochanter. No acute fracture identified. There is no dislocation. Moderate left and advanced right hip arthritic changes. Degenerative changes of the lower lumbar spine. The soft tissues are unremarkable. IMPRESSION: No acute fracture or dislocation. Electronically Signed   By: Anner Crete M.D.   On: 03/29/2021 17:29   CT Head Wo Contrast  Result Date: 03/29/2021 CLINICAL DATA:  Fall.  Head trauma EXAM: CT HEAD WITHOUT CONTRAST TECHNIQUE: Contiguous axial images were obtained from the base of the skull through the vertex without intravenous contrast. COMPARISON:  None. FINDINGS: Brain: No acute intracranial hemorrhage. No focal mass lesion. No CT evidence of acute infarction. No midline shift or mass effect. No hydrocephalus. Basilar cisterns are patent. There are periventricular and subcortical white matter hypodensities. Generalized cortical atrophy. Vascular: No hyperdense vessel or unexpected calcification. Skull: Normal. Negative for fracture or focal lesion. Sinuses/Orbits: Paranasal sinuses and mastoid air cells are clear. Orbits are clear. Other: None. IMPRESSION: 1. No intracranial trauma. 2. Atrophy and white matter microvascular disease Electronically Signed   By:  Suzy Bouchard M.D.   On: 03/29/2021 18:01   CT Lumbar Spine Wo Contrast  Result Date: 03/29/2021 CLINICAL DATA:  87 year old post fall when transferring from 1 chair to another. EXAM: CT LUMBAR SPINE WITHOUT CONTRAST TECHNIQUE: Multidetector CT imaging of the lumbar spine was performed without intravenous contrast administration. Multiplanar CT image reconstructions were also generated. COMPARISON:  None. FINDINGS: Segmentation: 5 lumbar type vertebrae.  S1 is partially lumbarized. Alignment: Broad-based dextroscoliotic curvature. 4 mm anterolisthesis of L5 on S1. Vertebrae: No acute fracture. No vertebral body compression. None fusion posterior elements of S1. No fracture of included sacrum. Bones diffusely under mineralized. Paraspinal and other soft tissues: No acute or traumatic findings. Fatty atrophy of paraspinal musculature. Aortic atherosclerosis. Disc levels: Diffuse degenerative disc disease with disc space narrowing and endplate spurring. There is multilevel facet hypertrophy. Mild bony canal narrowing at L4-L5. No high-grade canal stenosis. IMPRESSION: 1. No acute fracture of the lumbar spine. 2. Broad-based dextroscoliotic curvature with multilevel degenerative disc disease and facet hypertrophy. Aortic Atherosclerosis (ICD10-I70.0). Electronically Signed   By: Keith Rake M.D.   On: 03/29/2021 17:58   DG Knee  Complete 4 Views Right  Result Date: 03/29/2021 CLINICAL DATA:  Fall.  87 year old female EXAM: RIGHT KNEE - COMPLETE 4+ VIEW COMPARISON:  None. FINDINGS: Oblique fracture through the distal femoral metadiaphysis with medial displacement of the distal fracture fragment by 1 bone width. Mild comminution along the fracture plane which enters the proximal metaphysis. IMPRESSION: Oblique comminuted displaced fracture of the distal femur as above. Electronically Signed   By: Suzy Bouchard M.D.   On: 03/29/2021 17:29   DG FEMUR, MIN 2 VIEWS RIGHT  Result Date: 03/29/2021 CLINICAL  DATA:  87 year old female with fall and trauma to the right lower extremity. EXAM: RIGHT FEMUR 2 VIEWS COMPARISON:  Pelvic radiograph dated 03/29/2021. FINDINGS: There is a comminuted oblique fracture of the distal femoral diaphysis. There is medial and anterior angulation of the major distal fracture fragment. No dislocation. There is advanced osteopenia with severe degenerative changes of the right knee. There is soft tissue swelling of the anterior soft tissues above the knee with probable small suprapatellar effusion. Vascular calcification. IMPRESSION: Comminuted, displaced and angulated, oblique fracture of the distal femoral diaphysis. Electronically Signed   By: Anner Crete M.D.   On: 03/29/2021 17:32    Review of Systems  Unable to perform ROS: Dementia   Blood pressure (!) 76/50, pulse 75, temperature 97.6 F (36.4 C), temperature source Oral, resp. rate 18, height 5\' 1"  (1.549 m), weight 51.7 kg, SpO2 96 %. Physical Exam Constitutional:      General: She is not in acute distress.    Appearance: She is well-developed. She is not diaphoretic.  HENT:     Head: Normocephalic and atraumatic.  Eyes:     General: No scleral icterus.       Right eye: No discharge.        Left eye: No discharge.     Conjunctiva/sclera: Conjunctivae normal.  Cardiovascular:     Rate and Rhythm: Normal rate and regular rhythm.  Pulmonary:     Effort: Pulmonary effort is normal. No respiratory distress.  Musculoskeletal:     Cervical back: Normal range of motion.     Comments: LLE No traumatic wounds, ecchymosis, or rash  KI in place, compartments soft  No ankle effusion  Sens DPN, SPN, TN grossly intact  Motor EHL, ext, flex, evers grossly intact  DP 1+, PT 1+, No significant edema  Skin:    General: Skin is warm and dry.  Neurological:     Mental Status: She is alert.  Psychiatric:        Behavior: Behavior normal.     Assessment/Plan: Right distal femur fx -- Discussed pros/cons of  operative vs non-operative treatment. Son in on the way here [facility]). They will let me know when they decide on course of action. For now keep NPO. Multiple medical problems including anemia, chronic duodenal ulcer, chronic pain, constipation, diabetes, GERD, hypertension, hypothyroidism, CKD, cataracts, dementia, and DVT on Eliquis -- per primary service. Please continue to hold Eliquis.    Lisette Abu, PA-C Orthopedic Surgery [phone] 03/30/2021, 11:00 AM

## 2021-03-30 NOTE — Plan of Care (Signed)

## 2021-03-30 NOTE — TOC CAGE-AID Note (Signed)
Transition of Care [facility]) - CAGE-AID Screening   Patient Details  Name: Casey Watts MRN: [id number] Date of Birth: [birth year]  [facility]) CM/SW Contact:    Clovis Cao, RN Phone Number: [phone] 03/30/2021, 11:50 AM   Clinical Narrative: Pt denies alcohol or drug use.   CAGE-AID Screening:    Have You Ever Felt You Ought to Cut Down on Your Drinking or Drug Use?: No Have People Annoyed You By Critizing Your Drinking Or Drug Use?: No Have You Felt Bad Or Guilty About Your Drinking Or Drug Use?: No Have You Ever Had a Drink or Used Drugs First Thing In The Morning to Steady Your Nerves or to Get Rid of a Hangover?: No CAGE-AID Score: 0  Substance Abuse Education Offered: No

## 2021-03-30 NOTE — Progress Notes (Signed)
   03/30/21 1044  Assess: MEWS Score  Temp 97.6 F (36.4 C)  BP (!) 76/50  Pulse Rate 75  Resp 18  Level of Consciousness Alert  SpO2 96 %  O2 Device Nasal Cannula  O2 Flow Rate (L/min) 0.5 L/min  Assess: MEWS Score  MEWS Temp 0  MEWS Systolic 2  MEWS Pulse 0  MEWS RR 0  MEWS LOC 0  MEWS Score 2  MEWS Score Color Yellow  Assess: if the MEWS score is Yellow or Red  Were vital signs taken at a resting state? Yes  Focused Assessment No change from prior assessment  Early Detection of Sepsis Score *See Row Information* Low  MEWS guidelines implemented *See Row Information* Yes  Treat  Pain Scale 0-10  Pain Score 0  Take Vital Signs  Increase Vital Sign Frequency  Yellow: Q 2hr X 2 then Q 4hr X 2, if remains yellow, continue Q 4hrs  Escalate  MEWS: Escalate Yellow: discuss with charge nurse/RN and consider discussing with provider and RRT  Notify: Charge Nurse/RN  Name of Charge Nurse/RN Notified [place], RN  Date Charge Nurse/RN Notified 03/30/21  Time Charge Nurse/RN Notified 1050  Notify: Provider  Provider Name/Title Shelly Coss MD  Date Provider Notified 03/30/21  Time Provider Notified 1044  Notification Type Page  Notification Reason Other (Comment) (low BP)  Provider response See new orders  Date of Provider Response 03/30/21  Time of Provider Response 1051  Document  Progress note created (see row info) Yes   MD to place bolus orders

## 2021-03-30 NOTE — TOC Initial Note (Addendum)
Transition of Care [facility]) - Initial/Assessment Note    Patient Details  Name: Casey Watts MRN: [id number] Date of Birth: 1924-08-19  Transition [facility]) CM/SW Contact:    Sharin Mons, RN Phone Number: 03/30/2021, 2:13 PM  Clinical Narrative:                 Pt admitted with R femur fx,s/p fall. Hx of dementia, anemia, chronic duodenal ulcer, chronic pain syndrome, constipation, diabetes type 2, GERD, hypertension, CKD. Pt resides @ [facility]. Amorah [place] (Son)/HCPOA     [phone]      NCM spoke with son in regarding to d/c planning, pt 's potential need for SNF placement @ d/c. Son agreeable to SNF placement if needed.  Plan : surgery 4/26, femur fx repair  TOC team following and will assist with TOC needs.....   4/27 - s/p Open reduction internal fixation of right supracondylar femur fracture  - PT/OT  Evaluations pending  4/29 @1600  NCM received consult for possible SNF placement at time of discharge. RNCM spoke with patient's son regarding PT recommendation of SNF placement at time of discharge. Son expressed understanding of PT recommendation and is agreeable to SNF placement at time of discharge for pt . Son reports no preference for SNF . RNCM discussed insurance authorization process and provided Medicare SNF ratings list. No further questions reported at this time. RNCM to continue to follow and assist with discharge planning needs.    Expected Discharge Plan: Skilled Nursing Facility Barriers to Discharge: Continued Medical Work up   Patient Goals and CMS Choice        Expected Discharge Plan and Services Expected Discharge Plan: [place]   Discharge Planning Services: CM Consult                                          Prior Living Arrangements/Services                       Activities of Daily Living Home Assistive Devices/Equipment: Eyeglasses ADL Screening (condition at time of  admission) Patient's cognitive ability adequate to safely complete daily activities?: Yes Is the patient deaf or have difficulty hearing?: No Does the patient have difficulty seeing, even when wearing glasses/contacts?: No Does the patient have difficulty concentrating, remembering, or making decisions?: No Patient able to express need for assistance with ADLs?: Yes Does the patient have difficulty dressing or bathing?: Yes Independently performs ADLs?: No Communication: Independent Dressing (OT): Needs assistance Is this a change from baseline?: Change from baseline, expected to last >3 days Grooming: Needs assistance Is this a change from baseline?: Change from baseline, expected to last >3 days Feeding: Independent Bathing: Needs assistance Is this a change from baseline?: Change from baseline, expected to last >3 days Toileting: Dependent Is this a change from baseline?: Change from baseline, expected to last >3days In/Out Bed: Needs assistance Is this a change from baseline?: Pre-admission baseline Walks in Home: Dependent Is this a change from baseline?: Change from baseline, expected to last >3 days Does the patient have difficulty walking or climbing stairs?: Yes Weakness of Legs: Right Weakness of Arms/Hands: None  Permission Sought/Granted                  Emotional Assessment              Admission  diagnosis:  Fall [W19.XXXA] Closed fracture of right distal femur ([place]) [S72.401A] Closed comminuted intra-articular fracture of distal end of right femur, initial encounter [facility]) [S72.491A] Patient Active Problem List   Diagnosis Date Noted  . Closed fracture of right distal femur ([place]) 03/29/2021  . Acute respiratory failure with hypoxia ([place]) 04/01/2018  . Chronic anemia 04/01/2018  . Iron deficiency anemia 04/01/2018  . Pressure injury of skin 03/16/2018  . Lactic acidosis 03/16/2018  . Diabetes mellitus type 2 in nonobese ([place]) 03/16/2018  . Cellulitis  of both lower extremities 03/15/2018  . Chest pain 05/02/2017  . Diabetes mellitus with complication ([place]) 77/82/4235  . Renal insufficiency 05/02/2017  . Leukocytosis 05/02/2017  . Chronic pain 05/02/2017  . Atypical chest pain   . GERD (gastroesophageal reflux disease) 02/13/2014  . Constipation 02/13/2014  . Hypothyroidism 02/13/2014  . Hypertension 02/13/2014  . Proximal humeral fracture 02/04/2014  . Stricture and stenosis of esophagus 02/28/2011  . Chronic duodenal ulcer 02/28/2011  . DM 01/19/2011   PCP:  Pcp, No Pharmacy:  No Pharmacies Listed    Social Determinants of Health (SDOH) Interventions    Readmission Risk Interventions No flowsheet data found.

## 2021-03-30 NOTE — Progress Notes (Signed)
PROGRESS NOTE    Casey Watts  XLK:[id number] DOB: Jul 25, 1924 DOA: 03/29/2021 PCP: Pcp, No   Chief Complain: Fall  Brief Narrative: Patient is a 87 year old female with history of anemia, chronic duodenal ulcer, chronic pain syndrome, constipation, diabetes type 2, GERD, hypertension, CKD who presented after a fall at her assisted living facility.  She was about to be transferred from 1 chair to another and sustained fall during that time.  X-rays showed right comminuted distal femur fracture.  Orthopedics consulted.  Family unsure consenting for surgery right away so family discussing with orthopedics about risks and benefits before proceeding for surgery..  Assessment & Plan:   Principal Problem:   Closed fracture of right distal femur (HCC) Active Problems:   Chronic duodenal ulcer   GERD (gastroesophageal reflux disease)   Hypothyroidism   Hypertension   Renal insufficiency   Leukocytosis   Chronic pain   Diabetes mellitus type 2 in nonobese (HCC)   Chronic anemia   Iron deficiency anemia   Closed fracture of right distal femur: Fell while being transferred from 1 chair to another.  X-ray showed comminuted distal femur fracture.  Orthopedics consulted and following.Continue pain management, supportive care.Family unsure consenting for surgery right away so they are discussing with orthopedics about risks and benefits before proceeding for surgery.Marland Kitchen PT/OT evaluation after surgery( if done).  She might  need skilled nursing facility on discharge.  Leukocytosis: Most likely reactive.  No indication for antibiotic treatment.  Monitor  Recent history of DVT: Recently diagnosed and was started Eliquis 3 weeks ago.  Currently on SCD.  Will resume Eliquis after surgery if done  Chronic normocytic anemia: Has history of iron deficiency anemia.  Drop in the hemoglobin is most likely dilutional.  Continue to monitor.  Hypertension: History of hypertension.  Blood pressure currently  soft.  Likely associated with pain medications.  She had to be given fluid boluses. continue to monitor, minimize narcotics.  Home antihypertensives on hold  GERD/chronic duodenal ulcer: On  PPI  Diabetes type 2: On sliding scale insulin.  Monitor blood sugars  CKD stage II: Currently kidney function stable and at baseline.  Avoid  Nephrotoxins.  Dementia/debility: Advanced.  Confused at baseline.  She cannot ambulate and is chair bound.  Confused at baseline.  She lives in an ALF.          DVT prophylaxis:SCD Code Status: DNR Family Communication: Discussed with son on phone, discussed with daughter-in-law at bedside Status is: Inpatient  Remains inpatient appropriate because:Inpatient level of care appropriate due to severity of illness   Dispo: The patient is from: ALF              Anticipated d/c is to: SNF              Patient currently is not medically stable to d/c.   Difficult to place patient No     Consultants: Orthopedics  Procedures: None yet  Antimicrobials:  Anti-infectives (From admission, onward)   None      Subjective:  Patient seen and examined at the bedside this morning.  Daughter-in-law at bedside.  Her pain was overall well controlled.  Blood pressure was soft.  Long discussion held at the bedside with family.  They wanted to know about the risks and benefit of the surgery and they do not want her to suffer from pain.  They are also concerned about her advanced age, dementia  Objective: Vitals:   03/30/21 0531 03/30/21 0600 03/30/21 0756 03/30/21  0807  BP: 90/77  (!) 84/54 (!) 90/54  Pulse: 79  73   Resp:   18   Temp:   97.9 F (36.6 C)   TempSrc:   Oral   SpO2: 90% 94% 93%   Weight:      Height:       No intake or output data in the 24 hours ending 03/30/21 0824 Filed Weights   03/29/21 2300  Weight: 51.7 kg    Examination:  General exam: Overall comfortable, not in  distress, pleasantly confused, elderly female HEENT:  PERRL Respiratory system:  no wheezes or crackles  Cardiovascular system: S1 & S2 heard, RRR.  Gastrointestinal system: Abdomen is nondistended, soft and nontender. Central nervous system: Alert and awake but not oriented, global weakness Extremities: No edema, no clubbing ,no cyanosis, tenderness on the right thigh Skin: No rashes, no ulcers,no icterus    Data Reviewed: I have personally reviewed following labs and imaging studies  CBC: Recent Labs  Lab 03/29/21 1749 03/30/21 0131  WBC 14.8* 12.0*  NEUTROABS 12.7*  --   HGB 9.9* 8.6*  HCT 31.1* 26.7*  MCV 101.3* 100.0  PLT 269 299   Basic Metabolic Panel: Recent Labs  Lab 03/29/21 1749 03/30/21 0131  NA 137 137  K 4.3 4.0  CL 98 101  CO2 27 30  GLUCOSE 190* 122*  BUN 19 20  CREATININE 1.14* 1.12*  CALCIUM 8.0* 7.9*   GFR: Estimated Creatinine Clearance: 22.7 mL/min (A) (by C-G formula based on SCr of 1.12 mg/dL (H)). Liver Function Tests: No results for input(s): AST, ALT, ALKPHOS, BILITOT, PROT, ALBUMIN in the last 168 hours. No results for input(s): LIPASE, AMYLASE in the last 168 hours. No results for input(s): AMMONIA in the last 168 hours. Coagulation Profile: No results for input(s): INR, PROTIME in the last 168 hours. Cardiac Enzymes: No results for input(s): CKTOTAL, CKMB, CKMBINDEX, TROPONINI in the last 168 hours. BNP (last 3 results) No results for input(s): PROBNP in the last 8760 hours. HbA1C: Recent Labs    03/30/21 0131  HGBA1C 5.8*   CBG: Recent Labs  Lab 03/29/21 2151 03/30/21 0301  GLUCAP 190* 128*   Lipid Profile: No results for input(s): CHOL, HDL, LDLCALC, TRIG, CHOLHDL, LDLDIRECT in the last 72 hours. Thyroid Function Tests: No results for input(s): TSH, T4TOTAL, FREET4, T3FREE, THYROIDAB in the last 72 hours. Anemia Panel: Recent Labs    03/30/21 0131  VITAMINB12 553  FOLATE 35.8   Sepsis Labs: No results for input(s): PROCALCITON, LATICACIDVEN in the last 168  hours.  Recent Results (from the past 240 hour(s))  Resp Panel by RT-PCR (Flu A&B, Covid) Nasopharyngeal Swab     Status: None   Collection Time: 03/29/21  5:49 PM   Specimen: Nasopharyngeal Swab; Nasopharyngeal(NP) swabs in vial transport medium  Result Value Ref Range Status   SARS Coronavirus 2 by RT PCR NEGATIVE NEGATIVE Final    Comment: (NOTE) SARS-CoV-2 target nucleic acids are NOT DETECTED.  The SARS-CoV-2 RNA is generally detectable in upper respiratory specimens during the acute phase of infection. The lowest concentration of SARS-CoV-2 viral copies this assay can detect is 138 copies/mL. A negative result does not preclude SARS-Cov-2 infection and should not be used as the sole basis for treatment or other patient management decisions. A negative result may occur with  improper specimen collection/handling, submission of specimen other than nasopharyngeal swab, presence of viral mutation(s) within the areas targeted by this assay, and inadequate number of viral copies(<138  copies/mL). A negative result must be combined with clinical observations, patient history, and epidemiological information. The expected result is Negative.  Fact Sheet for Patients:  EntrepreneurPulse.com.au  Fact Sheet for Healthcare Providers:  IncredibleEmployment.be  This test is no t yet approved or cleared by the [place] FDA and  has been authorized for detection and/or diagnosis of SARS-CoV-2 by FDA under an Emergency Use Authorization (EUA). This EUA will remain  in effect (meaning this test can be used) for the duration of the COVID-19 declaration under Section 564(b)(1) of the Act, 21 U.S.C.section 360bbb-3(b)(1), unless the authorization is terminated  or revoked sooner.       Influenza A by PCR NEGATIVE NEGATIVE Final   Influenza B by PCR NEGATIVE NEGATIVE Final    Comment: (NOTE) The Xpert Xpress SARS-CoV-2/FLU/RSV plus assay is intended  as an aid in the diagnosis of influenza from Nasopharyngeal swab specimens and should not be used as a sole basis for treatment. Nasal washings and aspirates are unacceptable for Xpert Xpress SARS-CoV-2/FLU/RSV testing.  Fact Sheet for Patients: EntrepreneurPulse.com.au  Fact Sheet for Healthcare Providers: IncredibleEmployment.be  This test is not yet approved or cleared by the [place] FDA and has been authorized for detection and/or diagnosis of SARS-CoV-2 by FDA under an Emergency Use Authorization (EUA). This EUA will remain in effect (meaning this test can be used) for the duration of the COVID-19 declaration under Section 564(b)(1) of the Act, 21 U.S.C. section 360bbb-3(b)(1), unless the authorization is terminated or revoked.  Performed at [facility], [place] [address] [place], [place] [address]   Surgical pcr screen     Status: None   Collection Time: 03/30/21 12:50 AM   Specimen: Nasal Mucosa; Nasal Swab  Result Value Ref Range Status   MRSA, PCR NEGATIVE NEGATIVE Final   Staphylococcus aureus NEGATIVE NEGATIVE Final    Comment: (NOTE) The Xpert SA Assay (FDA approved for NASAL specimens in patients 53 years of age and older), is one component of a comprehensive surveillance program. It is not intended to diagnose infection nor to guide or monitor treatment. Performed at [facility], [place] [address], [place], [place] [address]          Radiology Studies: DG Chest 1 View  Result Date: 03/29/2021 CLINICAL DATA:  Fall. EXAM: CHEST  1 VIEW COMPARISON:  03/20/2018 FINDINGS: Normal cardiac silhouette. Linear atelectasis in the RIGHT midlung. Lungs are hyperinflated. No effusion infiltrate or pneumothorax identified. LEFT shoulder arthroplasty noted. IMPRESSION: 1. No evidence of thoracic trauma. 2. RIGHT lung linear atelectasis. Electronically Signed   By: Suzy Bouchard M.D.   On: 03/29/2021 17:35   DG Pelvis  1-2 Views  Result Date: 03/29/2021 CLINICAL DATA:  87 year old female with fall. EXAM: PELVIS - 1-2 VIEW COMPARISON:  None. FINDINGS: Evaluation is limited due to advanced osteopenia and superimposition of the right hand over the right femur trochanter. No acute fracture identified. There is no dislocation. Moderate left and advanced right hip arthritic changes. Degenerative changes of the lower lumbar spine. The soft tissues are unremarkable. IMPRESSION: No acute fracture or dislocation. Electronically Signed   By: Anner Crete M.D.   On: 03/29/2021 17:29   CT Head Wo Contrast  Result Date: 03/29/2021 CLINICAL DATA:  Fall.  Head trauma EXAM: CT HEAD WITHOUT CONTRAST TECHNIQUE: Contiguous axial images were obtained from the base of the skull through the vertex without intravenous contrast. COMPARISON:  None. FINDINGS: Brain: No acute intracranial hemorrhage. No focal mass lesion. No  CT evidence of acute infarction. No midline shift or mass effect. No hydrocephalus. Basilar cisterns are patent. There are periventricular and subcortical white matter hypodensities. Generalized cortical atrophy. Vascular: No hyperdense vessel or unexpected calcification. Skull: Normal. Negative for fracture or focal lesion. Sinuses/Orbits: Paranasal sinuses and mastoid air cells are clear. Orbits are clear. Other: None. IMPRESSION: 1. No intracranial trauma. 2. Atrophy and white matter microvascular disease Electronically Signed   By: Suzy Bouchard M.D.   On: 03/29/2021 18:01   CT Lumbar Spine Wo Contrast  Result Date: 03/29/2021 CLINICAL DATA:  87 year old post fall when transferring from 1 chair to another. EXAM: CT LUMBAR SPINE WITHOUT CONTRAST TECHNIQUE: Multidetector CT imaging of the lumbar spine was performed without intravenous contrast administration. Multiplanar CT image reconstructions were also generated. COMPARISON:  None. FINDINGS: Segmentation: 5 lumbar type vertebrae.  S1 is partially lumbarized.  Alignment: Broad-based dextroscoliotic curvature. 4 mm anterolisthesis of L5 on S1. Vertebrae: No acute fracture. No vertebral body compression. None fusion posterior elements of S1. No fracture of included sacrum. Bones diffusely under mineralized. Paraspinal and other soft tissues: No acute or traumatic findings. Fatty atrophy of paraspinal musculature. Aortic atherosclerosis. Disc levels: Diffuse degenerative disc disease with disc space narrowing and endplate spurring. There is multilevel facet hypertrophy. Mild bony canal narrowing at L4-L5. No high-grade canal stenosis. IMPRESSION: 1. No acute fracture of the lumbar spine. 2. Broad-based dextroscoliotic curvature with multilevel degenerative disc disease and facet hypertrophy. Aortic Atherosclerosis (ICD10-I70.0). Electronically Signed   By: Keith Rake M.D.   On: 03/29/2021 17:58   DG Knee Complete 4 Views Right  Result Date: 03/29/2021 CLINICAL DATA:  Fall.  87 year old female EXAM: RIGHT KNEE - COMPLETE 4+ VIEW COMPARISON:  None. FINDINGS: Oblique fracture through the distal femoral metadiaphysis with medial displacement of the distal fracture fragment by 1 bone width. Mild comminution along the fracture plane which enters the proximal metaphysis. IMPRESSION: Oblique comminuted displaced fracture of the distal femur as above. Electronically Signed   By: Suzy Bouchard M.D.   On: 03/29/2021 17:29   DG FEMUR, MIN 2 VIEWS RIGHT  Result Date: 03/29/2021 CLINICAL DATA:  87 year old female with fall and trauma to the right lower extremity. EXAM: RIGHT FEMUR 2 VIEWS COMPARISON:  Pelvic radiograph dated 03/29/2021. FINDINGS: There is a comminuted oblique fracture of the distal femoral diaphysis. There is medial and anterior angulation of the major distal fracture fragment. No dislocation. There is advanced osteopenia with severe degenerative changes of the right knee. There is soft tissue swelling of the anterior soft tissues above the knee with  probable small suprapatellar effusion. Vascular calcification. IMPRESSION: Comminuted, displaced and angulated, oblique fracture of the distal femoral diaphysis. Electronically Signed   By: Anner Crete M.D.   On: 03/29/2021 17:32        Scheduled Meds: . cholecalciferol  1,000 Units Oral Q0600  . insulin aspart  0-9 Units Subcutaneous Q4H  . levothyroxine  200 mcg Oral Q0600  . multivitamin with minerals  1 tablet Oral Q0600  . pantoprazole  20 mg Oral Daily   Continuous Infusions: . lactated ringers 75 mL/hr at 03/30/21 0324     LOS: 1 day    Time spent: 35 mins.More than 50% of that time was spent in counseling and/or coordination of care.      Shelly Coss, MD [facility] P4/26/2022, 8:24 AM

## 2021-03-30 NOTE — H&P (View-Only) (Signed)
Reason for Consult:Right distal femur fx Referring Physician: A Akhikari Time called: [id number] Time at bedside: Casey Watts is an 87 y.o. female.  HPI: Casey Watts got up to walk last night and fell. She is not ambulatory at baseline and hasn't walked in 3 years. She had immediate right leg pain and was brought to the ED. X-rays showed a distal femur fx and orthopedic surgery was consulted. She lives in a SNF. Pt has some dementia which waxes and wanes and most history was provided my DIL. Family is unsure about surgery.  Past Medical History:  Diagnosis Date  . Anemia   . Arthritis   . Asthma    as a child  . Cancer ([place])    skin cancer on left leg  . Cataract   . Cellulitis and abscess of lower extremity 03/21/2018  . Diabetes mellitus    TYPE 2  . Esophageal stricture   . GERD (gastroesophageal reflux disease)   . Glaucoma   . Hypertension   . Hypothyroidism   . Thyroid disease     Past Surgical History:  Procedure Laterality Date  . APPENDECTOMY    . CATARACT EXTRACTION Left   . EYE SURGERY    . FOOT SURGERY Bilateral    Bunionectomy  . REVERSE SHOULDER ARTHROPLASTY Left 02/04/2014   DR MURPHY  . REVERSE SHOULDER ARTHROPLASTY Left 02/04/2014   Procedure: REVERSE SHOULDER ARTHROPLASTY;  Surgeon: Renette Butters, MD;  Location: [facility];  Service: Orthopedics;  Laterality: Left;    Family History  Problem Relation Age of Onset  . Heart disease Brother   . Colon cancer Neg Hx   . Cancer Neg Hx     Social History:  reports that she has never smoked. She has never used smokeless tobacco. She reports current alcohol use. She reports that she does not use drugs.  Allergies: No Known Allergies  Medications: I have reviewed the patient's current medications.  Results for orders placed or performed during the hospital encounter of 03/29/21 (from the past 48 hour(s))  CBC with Differential     Status: Abnormal   Collection Time: 03/29/21  5:49 PM  Result Value Ref  Range   WBC 14.8 (H) 4.0 - 10.5 K/uL   RBC 3.07 (L) 3.87 - 5.11 MIL/uL   Hemoglobin 9.9 (L) 12.0 - 15.0 g/dL   HCT 31.1 (L) 36.0 - 46.0 %   MCV 101.3 (H) 80.0 - 100.0 fL   MCH 32.2 26.0 - 34.0 pg   MCHC 31.8 30.0 - 36.0 g/dL   RDW 14.0 11.5 - 15.5 %   Platelets 269 150 - 400 K/uL   nRBC 0.0 0.0 - 0.2 %   Neutrophils Relative % 85 %   Neutro Abs 12.7 (H) 1.7 - 7.7 K/uL   Lymphocytes Relative 5 %   Lymphs Abs 0.7 0.7 - 4.0 K/uL   Monocytes Relative 9 %   Monocytes Absolute 1.3 (H) 0.1 - 1.0 K/uL   Eosinophils Relative 0 %   Eosinophils Absolute 0.0 0.0 - 0.5 K/uL   Basophils Relative 0 %   Basophils Absolute 0.0 0.0 - 0.1 K/uL   Immature Granulocytes 1 %   Abs Immature Granulocytes 0.10 (H) 0.00 - 0.07 K/uL    Comment: Performed at [facility], [address] [place], [place] [id number]  Basic metabolic panel     Status: Abnormal   Collection Time: 03/29/21  5:49 PM  Result Value Ref Range  Sodium 137 135 - 145 mmol/L   Potassium 4.3 3.5 - 5.1 mmol/L   Chloride 98 98 - 111 mmol/L   CO2 27 22 - 32 mmol/L   Glucose, Bld 190 (H) 70 - 99 mg/dL    Comment: Glucose reference range applies only to samples taken after fasting for at least 8 hours.   BUN 19 8 - 23 mg/dL   Creatinine, Ser 1.14 (H) 0.44 - 1.00 mg/dL   Calcium 8.0 (L) 8.9 - 10.3 mg/dL   GFR, Estimated 44 (L) >60 mL/min    Comment: (NOTE) Calculated using the CKD-EPI Creatinine Equation (2021)    Anion gap 12 5 - 15    Comment: Performed at [facility] [address] [place], [place] [address]  Resp Panel by RT-PCR (Flu A&B, Covid) Nasopharyngeal Swab     Status: None   Collection Time: 03/29/21  5:49 PM   Specimen: Nasopharyngeal Swab; Nasopharyngeal(NP) swabs in vial transport medium  Result Value Ref Range   SARS Coronavirus 2 by RT PCR NEGATIVE NEGATIVE    Comment: (NOTE) SARS-CoV-2 target nucleic acids are NOT DETECTED.  The SARS-CoV-2 RNA is generally detectable in upper  respiratory specimens during the acute phase of infection. The lowest concentration of SARS-CoV-2 viral copies this assay can detect is 138 copies/mL. A negative result does not preclude SARS-Cov-2 infection and should not be used as the sole basis for treatment or other patient management decisions. A negative result may occur with  improper specimen collection/handling, submission of specimen other than nasopharyngeal swab, presence of viral mutation(s) within the areas targeted by this assay, and inadequate number of viral copies(<138 copies/mL). A negative result must be combined with clinical observations, patient history, and epidemiological information. The expected result is Negative.  Fact Sheet for Patients:  EntrepreneurPulse.com.au  Fact Sheet for Healthcare Providers:  IncredibleEmployment.be  This test is no t yet approved or cleared by the [place] FDA and  has been authorized for detection and/or diagnosis of SARS-CoV-2 by FDA under an Emergency Use Authorization (EUA). This EUA will remain  in effect (meaning this test can be used) for the duration of the COVID-19 declaration under Section 564(b)(1) of the Act, 21 U.S.C.section 360bbb-3(b)(1), unless the authorization is terminated  or revoked sooner.       Influenza A by PCR NEGATIVE NEGATIVE   Influenza B by PCR NEGATIVE NEGATIVE    Comment: (NOTE) The Xpert Xpress SARS-CoV-2/FLU/RSV plus assay is intended as an aid in the diagnosis of influenza from Nasopharyngeal swab specimens and should not be used as a sole basis for treatment. Nasal washings and aspirates are unacceptable for Xpert Xpress SARS-CoV-2/FLU/RSV testing.  Fact Sheet for Patients: EntrepreneurPulse.com.au  Fact Sheet for Healthcare Providers: IncredibleEmployment.be  This test is not yet approved or cleared by the [place] FDA and has been authorized for  detection and/or diagnosis of SARS-CoV-2 by FDA under an Emergency Use Authorization (EUA). This EUA will remain in effect (meaning this test can be used) for the duration of the COVID-19 declaration under Section 564(b)(1) of the Act, 21 U.S.C. section 360bbb-3(b)(1), unless the authorization is terminated or revoked.  Performed at [facility], [place] [address], [place], [place] [address]   Type and screen [place]     Status: None   Collection Time: 03/29/21  6:24 PM  Result Value Ref Range   ABO/RH(D) O POS    Antibody Screen NEG    Sample Expiration  04/01/2021,2359 Performed at [facility] [address], [place], [place] [address]   Glucose, capillary     Status: Abnormal   Collection Time: 03/29/21  9:51 PM  Result Value Ref Range   Glucose-Capillary 190 (H) 70 - 99 mg/dL    Comment: Glucose reference range applies only to samples taken after fasting for at least 8 hours.  Surgical pcr screen     Status: None   Collection Time: 03/30/21 12:50 AM   Specimen: Nasal Mucosa; Nasal Swab  Result Value Ref Range   MRSA, PCR NEGATIVE NEGATIVE   Staphylococcus aureus NEGATIVE NEGATIVE    Comment: (NOTE) The Xpert SA Assay (FDA approved for NASAL specimens in patients 13 years of age and older), is one component of a comprehensive surveillance program. It is not intended to diagnose infection nor to guide or monitor treatment. Performed at [facility], [place] [address] [place], [place] [address]   Basic metabolic panel     Status: Abnormal   Collection Time: 03/30/21  1:31 AM  Result Value Ref Range   Sodium 137 135 - 145 mmol/L   Potassium 4.0 3.5 - 5.1 mmol/L   Chloride 101 98 - 111 mmol/L   CO2 30 22 - 32 mmol/L   Glucose, Bld 122 (H) 70 - 99 mg/dL    Comment: Glucose reference range applies only to samples taken after fasting for at least 8 hours.   BUN 20 8 - 23 mg/dL   Creatinine, Ser 1.12 (H) 0.44 - 1.00 mg/dL   Calcium  7.9 (L) 8.9 - 10.3 mg/dL   GFR, Estimated 45 (L) >60 mL/min    Comment: (NOTE) Calculated using the CKD-EPI Creatinine Equation (2021)    Anion gap 6 5 - 15    Comment: Performed at [facility] [address] [place], [place] [address]  CBC     Status: Abnormal   Collection Time: 03/30/21  1:31 AM  Result Value Ref Range   WBC 12.0 (H) 4.0 - 10.5 K/uL   RBC 2.67 (L) 3.87 - 5.11 MIL/uL   Hemoglobin 8.6 (L) 12.0 - 15.0 g/dL   HCT 26.7 (L) 36.0 - 46.0 %   MCV 100.0 80.0 - 100.0 fL   MCH 32.2 26.0 - 34.0 pg   MCHC 32.2 30.0 - 36.0 g/dL   RDW 14.1 11.5 - 15.5 %   Platelets 223 150 - 400 K/uL   nRBC 0.0 0.0 - 0.2 %    Comment: Performed at [facility], [place] [address] [place], [place] [address]  Hemoglobin A1c     Status: Abnormal   Collection Time: 03/30/21  1:31 AM  Result Value Ref Range   Hgb A1c MFr Bld 5.8 (H) 4.8 - 5.6 %    Comment: (NOTE) Pre diabetes:          5.7%-6.4%  Diabetes:              >6.4%  Glycemic control for   <7.0% adults with diabetes    Mean Plasma Glucose 119.76 mg/dL    Comment: Performed at [facility] [address] [place], [place] [address]  Vitamin B12     Status: None   Collection Time: 03/30/21  1:31 AM  Result Value Ref Range   Vitamin B-12 553 180 - 914 pg/mL    Comment: (NOTE) This assay is not validated for testing neonatal or myeloproliferative syndrome specimens for Vitamin B12 levels. Performed at [facility]  [facility], [place] [address], [place], [place] [address]   Folate, serum, performed at [facility]     Status: None   Collection Time: 03/30/21  1:31 AM  Result Value Ref Range   Folate 35.8 >5.9 ng/mL    Comment: Performed at [facility], [place] [address] [place], [place] [address]  Glucose, capillary     Status: Abnormal   Collection Time: 03/30/21  3:01 AM  Result Value Ref Range   Glucose-Capillary 128 (H) 70 - 99 mg/dL    Comment: Glucose reference range applies only to samples taken after  fasting for at least 8 hours.    DG Chest 1 View  Result Date: 03/29/2021 CLINICAL DATA:  Fall. EXAM: CHEST  1 VIEW COMPARISON:  03/20/2018 FINDINGS: Normal cardiac silhouette. Linear atelectasis in the RIGHT midlung. Lungs are hyperinflated. No effusion infiltrate or pneumothorax identified. LEFT shoulder arthroplasty noted. IMPRESSION: 1. No evidence of thoracic trauma. 2. RIGHT lung linear atelectasis. Electronically Signed   By: Suzy Bouchard M.D.   On: 03/29/2021 17:35   DG Pelvis 1-2 Views  Result Date: 03/29/2021 CLINICAL DATA:  87 year old female with fall. EXAM: PELVIS - 1-2 VIEW COMPARISON:  None. FINDINGS: Evaluation is limited due to advanced osteopenia and superimposition of the right hand over the right femur trochanter. No acute fracture identified. There is no dislocation. Moderate left and advanced right hip arthritic changes. Degenerative changes of the lower lumbar spine. The soft tissues are unremarkable. IMPRESSION: No acute fracture or dislocation. Electronically Signed   By: Anner Crete M.D.   On: 03/29/2021 17:29   CT Head Wo Contrast  Result Date: 03/29/2021 CLINICAL DATA:  Fall.  Head trauma EXAM: CT HEAD WITHOUT CONTRAST TECHNIQUE: Contiguous axial images were obtained from the base of the skull through the vertex without intravenous contrast. COMPARISON:  None. FINDINGS: Brain: No acute intracranial hemorrhage. No focal mass lesion. No CT evidence of acute infarction. No midline shift or mass effect. No hydrocephalus. Basilar cisterns are patent. There are periventricular and subcortical white matter hypodensities. Generalized cortical atrophy. Vascular: No hyperdense vessel or unexpected calcification. Skull: Normal. Negative for fracture or focal lesion. Sinuses/Orbits: Paranasal sinuses and mastoid air cells are clear. Orbits are clear. Other: None. IMPRESSION: 1. No intracranial trauma. 2. Atrophy and white matter microvascular disease Electronically Signed   By:  Suzy Bouchard M.D.   On: 03/29/2021 18:01   CT Lumbar Spine Wo Contrast  Result Date: 03/29/2021 CLINICAL DATA:  87 year old post fall when transferring from 1 chair to another. EXAM: CT LUMBAR SPINE WITHOUT CONTRAST TECHNIQUE: Multidetector CT imaging of the lumbar spine was performed without intravenous contrast administration. Multiplanar CT image reconstructions were also generated. COMPARISON:  None. FINDINGS: Segmentation: 5 lumbar type vertebrae.  S1 is partially lumbarized. Alignment: Broad-based dextroscoliotic curvature. 4 mm anterolisthesis of L5 on S1. Vertebrae: No acute fracture. No vertebral body compression. None fusion posterior elements of S1. No fracture of included sacrum. Bones diffusely under mineralized. Paraspinal and other soft tissues: No acute or traumatic findings. Fatty atrophy of paraspinal musculature. Aortic atherosclerosis. Disc levels: Diffuse degenerative disc disease with disc space narrowing and endplate spurring. There is multilevel facet hypertrophy. Mild bony canal narrowing at L4-L5. No high-grade canal stenosis. IMPRESSION: 1. No acute fracture of the lumbar spine. 2. Broad-based dextroscoliotic curvature with multilevel degenerative disc disease and facet hypertrophy. Aortic Atherosclerosis (ICD10-I70.0). Electronically Signed   By: Keith Rake M.D.   On: 03/29/2021 17:58   DG Knee  Complete 4 Views Right  Result Date: 03/29/2021 CLINICAL DATA:  Fall.  87 year old female EXAM: RIGHT KNEE - COMPLETE 4+ VIEW COMPARISON:  None. FINDINGS: Oblique fracture through the distal femoral metadiaphysis with medial displacement of the distal fracture fragment by 1 bone width. Mild comminution along the fracture plane which enters the proximal metaphysis. IMPRESSION: Oblique comminuted displaced fracture of the distal femur as above. Electronically Signed   By: Suzy Bouchard M.D.   On: 03/29/2021 17:29   DG FEMUR, MIN 2 VIEWS RIGHT  Result Date: 03/29/2021 CLINICAL  DATA:  87 year old female with fall and trauma to the right lower extremity. EXAM: RIGHT FEMUR 2 VIEWS COMPARISON:  Pelvic radiograph dated 03/29/2021. FINDINGS: There is a comminuted oblique fracture of the distal femoral diaphysis. There is medial and anterior angulation of the major distal fracture fragment. No dislocation. There is advanced osteopenia with severe degenerative changes of the right knee. There is soft tissue swelling of the anterior soft tissues above the knee with probable small suprapatellar effusion. Vascular calcification. IMPRESSION: Comminuted, displaced and angulated, oblique fracture of the distal femoral diaphysis. Electronically Signed   By: Anner Crete M.D.   On: 03/29/2021 17:32    Review of Systems  Unable to perform ROS: Dementia   Blood pressure (!) 76/50, pulse 75, temperature 97.6 F (36.4 C), temperature source Oral, resp. rate 18, height 5\' 1"  (1.549 m), weight 51.7 kg, SpO2 96 %. Physical Exam Constitutional:      General: She is not in acute distress.    Appearance: She is well-developed. She is not diaphoretic.  HENT:     Head: Normocephalic and atraumatic.  Eyes:     General: No scleral icterus.       Right eye: No discharge.        Left eye: No discharge.     Conjunctiva/sclera: Conjunctivae normal.  Cardiovascular:     Rate and Rhythm: Normal rate and regular rhythm.  Pulmonary:     Effort: Pulmonary effort is normal. No respiratory distress.  Musculoskeletal:     Cervical back: Normal range of motion.     Comments: LLE No traumatic wounds, ecchymosis, or rash  KI in place, compartments soft  No ankle effusion  Sens DPN, SPN, TN grossly intact  Motor EHL, ext, flex, evers grossly intact  DP 1+, PT 1+, No significant edema  Skin:    General: Skin is warm and dry.  Neurological:     Mental Status: She is alert.  Psychiatric:        Behavior: Behavior normal.     Assessment/Plan: Right distal femur fx -- Discussed pros/cons of  operative vs non-operative treatment. Son in on the way here [facility]). They will let me know when they decide on course of action. For now keep NPO. Multiple medical problems including anemia, chronic duodenal ulcer, chronic pain, constipation, diabetes, GERD, hypertension, hypothyroidism, CKD, cataracts, dementia, and DVT on Eliquis -- per primary service. Please continue to hold Eliquis.    Lisette Abu, PA-C Orthopedic Surgery [phone] 03/30/2021, 11:00 AM

## 2021-03-31 ENCOUNTER — Inpatient Hospital Stay (HOSPITAL_COMMUNITY): Payer: PPO

## 2021-03-31 ENCOUNTER — Inpatient Hospital Stay (HOSPITAL_COMMUNITY): Payer: PPO | Admitting: Anesthesiology

## 2021-03-31 ENCOUNTER — Encounter (HOSPITAL_COMMUNITY): Payer: Self-pay | Admitting: Internal Medicine

## 2021-03-31 ENCOUNTER — Encounter (HOSPITAL_COMMUNITY)
Admission: EM | Disposition: A | Discharge status: Hospice | Payer: Self-pay | Source: Skilled Nursing Facility | Attending: Internal Medicine

## 2021-03-31 DIAGNOSIS — S72401K Unspecified fracture of lower end of right femur, subsequent encounter for closed fracture with nonunion: Secondary | ICD-10-CM

## 2021-03-31 DIAGNOSIS — G8929 Other chronic pain: Secondary | ICD-10-CM | POA: Diagnosis not present

## 2021-03-31 DIAGNOSIS — D72829 Elevated white blood cell count, unspecified: Secondary | ICD-10-CM

## 2021-03-31 DIAGNOSIS — K267 Chronic duodenal ulcer without hemorrhage or perforation: Secondary | ICD-10-CM | POA: Diagnosis not present

## 2021-03-31 DIAGNOSIS — D649 Anemia, unspecified: Secondary | ICD-10-CM | POA: Diagnosis not present

## 2021-03-31 HISTORY — PX: ORIF FEMUR FRACTURE: SHX2119

## 2021-03-31 LAB — CBC WITH DIFFERENTIAL/PLATELET
Abs Immature Granulocytes: 0.06 10*3/uL (ref 0.00–0.07)
Basophils Absolute: 0.1 10*3/uL (ref 0.0–0.1)
Basophils Relative: 1 %
Eosinophils Absolute: 0.2 10*3/uL (ref 0.0–0.5)
Eosinophils Relative: 2 %
HCT: 21.4 % — ABNORMAL LOW (ref 36.0–46.0)
Hemoglobin: 7.1 g/dL — ABNORMAL LOW (ref 12.0–15.0)
Immature Granulocytes: 1 %
Lymphocytes Relative: 14 %
Lymphs Abs: 1.7 10*3/uL (ref 0.7–4.0)
MCH: 33.5 pg (ref 26.0–34.0)
MCHC: 33.2 g/dL (ref 30.0–36.0)
MCV: 100.9 fL — ABNORMAL HIGH (ref 80.0–100.0)
Monocytes Absolute: 1.9 10*3/uL — ABNORMAL HIGH (ref 0.1–1.0)
Monocytes Relative: 16 %
Neutro Abs: 8 10*3/uL — ABNORMAL HIGH (ref 1.7–7.7)
Neutrophils Relative %: 66 %
Platelets: 226 10*3/uL (ref 150–400)
RBC: 2.12 MIL/uL — ABNORMAL LOW (ref 3.87–5.11)
RDW: 14.6 % (ref 11.5–15.5)
WBC: 11.9 10*3/uL — ABNORMAL HIGH (ref 4.0–10.5)
nRBC: 0 % (ref 0.0–0.2)

## 2021-03-31 LAB — BASIC METABOLIC PANEL
Anion gap: 7 (ref 5–15)
BUN: 24 mg/dL — ABNORMAL HIGH (ref 8–23)
CO2: 28 mmol/L (ref 22–32)
Calcium: 7.8 mg/dL — ABNORMAL LOW (ref 8.9–10.3)
Chloride: 101 mmol/L (ref 98–111)
Creatinine, Ser: 1.1 mg/dL — ABNORMAL HIGH (ref 0.44–1.00)
GFR, Estimated: 46 mL/min — ABNORMAL LOW (ref >60)
Glucose, Bld: 82 mg/dL (ref 70–99)
Potassium: 3.9 mmol/L (ref 3.5–5.1)
Sodium: 136 mmol/L (ref 135–145)

## 2021-03-31 LAB — GLUCOSE, CAPILLARY
Glucose-Capillary: 100 mg/dL — ABNORMAL HIGH (ref 70–99)
Glucose-Capillary: 103 mg/dL — ABNORMAL HIGH (ref 70–99)
Glucose-Capillary: 105 mg/dL — ABNORMAL HIGH (ref 70–99)
Glucose-Capillary: 109 mg/dL — ABNORMAL HIGH (ref 70–99)
Glucose-Capillary: 118 mg/dL — ABNORMAL HIGH (ref 70–99)
Glucose-Capillary: 118 mg/dL — ABNORMAL HIGH (ref 70–99)
Glucose-Capillary: 80 mg/dL (ref 70–99)

## 2021-03-31 LAB — PREPARE RBC (CROSSMATCH)

## 2021-03-31 SURGERY — OPEN REDUCTION INTERNAL FIXATION (ORIF) DISTAL FEMUR FRACTURE
Anesthesia: General | Laterality: Right

## 2021-03-31 MED ORDER — METOCLOPRAMIDE HCL 5 MG/ML IJ SOLN: 5.0000 mg | Freq: Three times a day (TID) | INTRAMUSCULAR | Status: DC | PRN

## 2021-03-31 MED ORDER — LIDOCAINE HCL (CARDIAC) PF 100 MG/5ML IV SOSY
PREFILLED_SYRINGE | INTRAVENOUS | Status: DC | PRN
  Administered 2021-03-31: 40 mg via INTRAVENOUS

## 2021-03-31 MED ORDER — CHLORHEXIDINE GLUCONATE 0.12 % MT SOLN
15.0000 mL | OROMUCOSAL | Status: AC
  Administered 2021-03-31: 15 mL via OROMUCOSAL
  Filled 2021-03-31 (×2): qty 15

## 2021-03-31 MED ORDER — ALBUMIN HUMAN 5 % IV SOLN
INTRAVENOUS | Status: AC
  Administered 2021-03-31: 12.5 g via INTRAVENOUS
  Filled 2021-03-31: qty 250

## 2021-03-31 MED ORDER — CEFAZOLIN SODIUM-DEXTROSE 2-4 GM/100ML-% IV SOLN
2.0000 g | Freq: Two times a day (BID) | INTRAVENOUS | Status: AC
  Administered 2021-03-31 – 2021-04-01 (×2): 2 g via INTRAVENOUS
  Filled 2021-03-31 (×3): qty 100

## 2021-03-31 MED ORDER — DEXAMETHASONE SODIUM PHOSPHATE 10 MG/ML IJ SOLN
INTRAMUSCULAR | Status: AC
  Filled 2021-03-31: qty 1

## 2021-03-31 MED ORDER — ALBUMIN HUMAN 5 % IV SOLN: 12.5000 g | Freq: Once | INTRAVENOUS | Status: AC

## 2021-03-31 MED ORDER — OXYCODONE HCL 5 MG/5ML PO SOLN
5.0000 mg | Freq: Once | ORAL | Status: DC | PRN
Start: 2021-03-31 — End: 2021-03-31

## 2021-03-31 MED ORDER — PHENYLEPHRINE HCL (PRESSORS) 10 MG/ML IV SOLN
INTRAVENOUS | Status: DC | PRN
  Administered 2021-03-31: 80 ug via INTRAVENOUS

## 2021-03-31 MED ORDER — VANCOMYCIN HCL 1000 MG IV SOLR
INTRAVENOUS | Status: DC | PRN
  Administered 2021-03-31: 1000 mg

## 2021-03-31 MED ORDER — PHENYLEPHRINE 40 MCG/ML (10ML) SYRINGE FOR IV PUSH (FOR BLOOD PRESSURE SUPPORT)
PREFILLED_SYRINGE | INTRAVENOUS | Status: AC
  Filled 2021-03-31: qty 10

## 2021-03-31 MED ORDER — FENTANYL CITRATE (PF) 100 MCG/2ML IJ SOLN: 25.0000 ug | INTRAMUSCULAR | Status: DC | PRN

## 2021-03-31 MED ORDER — PROPOFOL 10 MG/ML IV BOLUS
INTRAVENOUS | Status: AC
  Filled 2021-03-31: qty 20

## 2021-03-31 MED ORDER — LACTATED RINGERS IV SOLN: INTRAVENOUS | Status: DC | PRN

## 2021-03-31 MED ORDER — POTASSIUM CHLORIDE IN NACL 20-0.9 MEQ/L-% IV SOLN
INTRAVENOUS | Status: DC
  Filled 2021-03-31 (×4): qty 1000

## 2021-03-31 MED ORDER — OXYCODONE HCL 5 MG PO TABS: 5.0000 mg | ORAL_TABLET | Freq: Once | ORAL | Status: DC | PRN

## 2021-03-31 MED ORDER — PHENYLEPHRINE HCL-NACL 10-0.9 MG/250ML-% IV SOLN
INTRAVENOUS | Status: DC | PRN
  Administered 2021-03-31: 25 ug/min via INTRAVENOUS

## 2021-03-31 MED ORDER — ONDANSETRON HCL 4 MG/2ML IJ SOLN
INTRAMUSCULAR | Status: AC
  Filled 2021-03-31: qty 2

## 2021-03-31 MED ORDER — VANCOMYCIN HCL 1000 MG IV SOLR
INTRAVENOUS | Status: AC
  Filled 2021-03-31: qty 1000

## 2021-03-31 MED ORDER — PROPOFOL 10 MG/ML IV BOLUS
INTRAVENOUS | Status: DC | PRN
  Administered 2021-03-31: 80 mg via INTRAVENOUS

## 2021-03-31 MED ORDER — FENTANYL CITRATE (PF) 100 MCG/2ML IJ SOLN
INTRAMUSCULAR | Status: DC | PRN
  Administered 2021-03-31 (×2): 25 ug via INTRAVENOUS

## 2021-03-31 MED ORDER — SUGAMMADEX SODIUM 200 MG/2ML IV SOLN
INTRAVENOUS | Status: DC | PRN
  Administered 2021-03-31: 200 mg via INTRAVENOUS

## 2021-03-31 MED ORDER — METOCLOPRAMIDE HCL 5 MG PO TABS
5.0000 mg | ORAL_TABLET | Freq: Three times a day (TID) | ORAL | Status: DC | PRN
Start: 2021-03-31 — End: 2021-04-06

## 2021-03-31 MED ORDER — FENTANYL CITRATE (PF) 250 MCG/5ML IJ SOLN
INTRAMUSCULAR | Status: AC
  Filled 2021-03-31: qty 5

## 2021-03-31 MED ORDER — ONDANSETRON HCL 4 MG/2ML IJ SOLN
INTRAMUSCULAR | Status: DC | PRN
  Administered 2021-03-31: 4 mg via INTRAVENOUS

## 2021-03-31 MED ORDER — ONDANSETRON HCL 4 MG/2ML IJ SOLN: 4.0000 mg | Freq: Four times a day (QID) | INTRAMUSCULAR | Status: DC | PRN

## 2021-03-31 MED ORDER — DOCUSATE SODIUM 100 MG PO CAPS
100.0000 mg | ORAL_CAPSULE | Freq: Two times a day (BID) | ORAL | Status: DC
  Administered 2021-03-31 – 2021-04-07 (×13): 100 mg via ORAL
  Filled 2021-03-31 (×15): qty 1

## 2021-03-31 MED ORDER — DEXAMETHASONE SODIUM PHOSPHATE 4 MG/ML IJ SOLN
INTRAMUSCULAR | Status: DC | PRN
  Administered 2021-03-31: 4 mg via INTRAVENOUS

## 2021-03-31 MED ORDER — SODIUM CHLORIDE 0.9% IV SOLUTION: Freq: Once | INTRAVENOUS | Status: AC

## 2021-03-31 MED ORDER — 0.9 % SODIUM CHLORIDE (POUR BTL) OPTIME
TOPICAL | Status: DC | PRN
  Administered 2021-03-31: 1000 mL

## 2021-03-31 MED ORDER — ONDANSETRON HCL 4 MG PO TABS: 4.0000 mg | ORAL_TABLET | Freq: Four times a day (QID) | ORAL | Status: DC | PRN

## 2021-03-31 MED ORDER — ROCURONIUM BROMIDE 100 MG/10ML IV SOLN
INTRAVENOUS | Status: DC | PRN
  Administered 2021-03-31: 40 mg via INTRAVENOUS
  Administered 2021-03-31: 20 mg via INTRAVENOUS

## 2021-03-31 MED ORDER — ROCURONIUM BROMIDE 10 MG/ML (PF) SYRINGE
PREFILLED_SYRINGE | INTRAVENOUS | Status: AC
  Filled 2021-03-31: qty 10

## 2021-03-31 MED ORDER — LACTATED RINGERS IV SOLN: INTRAVENOUS | Status: DC

## 2021-03-31 MED ORDER — LIDOCAINE 2% (20 MG/ML) 5 ML SYRINGE
INTRAMUSCULAR | Status: AC
  Filled 2021-03-31: qty 5

## 2021-03-31 MED ORDER — ONDANSETRON HCL 4 MG/2ML IJ SOLN: 4.0000 mg | Freq: Once | INTRAMUSCULAR | Status: DC | PRN

## 2021-03-31 MED ORDER — TRANEXAMIC ACID-NACL 1000-0.7 MG/100ML-% IV SOLN
1000.0000 mg | Freq: Once | INTRAVENOUS | Status: AC
  Administered 2021-03-31: 1000 mg via INTRAVENOUS
  Filled 2021-03-31: qty 100

## 2021-03-31 SURGICAL SUPPLY — 70 items
BIT DRILL 4.3 (BIT) ×2
BIT DRILL 4.3X300MM (BIT) IMPLANT
BIT DRILL LONG 3.3 (BIT) ×1 IMPLANT
BIT DRILL QC 3.3X195 (BIT) ×1 IMPLANT
BLADE CLIPPER SURG (BLADE) IMPLANT
BNDG COHESIVE 6X5 TAN STRL LF (GAUZE/BANDAGES/DRESSINGS) ×2 IMPLANT
BNDG ELASTIC 6X10 VLCR STRL LF (GAUZE/BANDAGES/DRESSINGS) ×2 IMPLANT
BNDG ELASTIC 6X5.8 VLCR STR LF (GAUZE/BANDAGES/DRESSINGS) ×1 IMPLANT
BRUSH SCRUB EZ PLAIN DRY (MISCELLANEOUS) ×4 IMPLANT
CANISTER SUCT 3000ML PPV (MISCELLANEOUS) ×2 IMPLANT
CAP LOCK NCB (Cap) ×7 IMPLANT
CHLORAPREP W/TINT 26 (MISCELLANEOUS) ×2 IMPLANT
COVER SURGICAL LIGHT HANDLE (MISCELLANEOUS) ×2 IMPLANT
COVER WAND RF STERILE (DRAPES) ×2 IMPLANT
DERMABOND ADVANCED (GAUZE/BANDAGES/DRESSINGS) ×1
DERMABOND ADVANCED .7 DNX12 (GAUZE/BANDAGES/DRESSINGS) IMPLANT
DRAPE C-ARM 42X72 X-RAY (DRAPES) ×2 IMPLANT
DRAPE C-ARMOR (DRAPES) ×2 IMPLANT
DRAPE HALF SHEET 40X57 (DRAPES) ×4 IMPLANT
DRAPE ORTHO SPLIT 77X108 STRL (DRAPES) ×4
DRAPE SURG 17X23 STRL (DRAPES) ×2 IMPLANT
DRAPE SURG ORHT 6 SPLT 77X108 (DRAPES) ×2 IMPLANT
DRAPE U-SHAPE 47X51 STRL (DRAPES) ×2 IMPLANT
DRSG ADAPTIC 3X8 NADH LF (GAUZE/BANDAGES/DRESSINGS) IMPLANT
DRSG MEPILEX BORDER 4X12 (GAUZE/BANDAGES/DRESSINGS) IMPLANT
DRSG MEPILEX BORDER 4X4 (GAUZE/BANDAGES/DRESSINGS) IMPLANT
DRSG MEPILEX BORDER 4X8 (GAUZE/BANDAGES/DRESSINGS) ×1 IMPLANT
DRSG PAD ABDOMINAL 8X10 ST (GAUZE/BANDAGES/DRESSINGS) ×6 IMPLANT
ELECT REM PT RETURN 9FT ADLT (ELECTROSURGICAL) ×2
ELECTRODE REM PT RTRN 9FT ADLT (ELECTROSURGICAL) ×1 IMPLANT
GAUZE SPONGE 4X4 12PLY STRL (GAUZE/BANDAGES/DRESSINGS) ×2 IMPLANT
GLOVE BIO SURGEON STRL SZ 6.5 (GLOVE) ×6 IMPLANT
GLOVE BIO SURGEON STRL SZ7.5 (GLOVE) ×8 IMPLANT
GLOVE BIOGEL PI IND STRL 7.5 (GLOVE) ×1 IMPLANT
GLOVE BIOGEL PI INDICATOR 7.5 (GLOVE) ×1
GLOVE SURG UNDER POLY LF SZ6.5 (GLOVE) ×2 IMPLANT
GOWN STRL REUS W/ TWL LRG LVL3 (GOWN DISPOSABLE) ×3 IMPLANT
GOWN STRL REUS W/TWL LRG LVL3 (GOWN DISPOSABLE) ×6
K-WIRE 2.0 (WIRE) ×2
K-WIRE FXSTD 280X2XNS SS (WIRE) ×1
KIT BASIN OR (CUSTOM PROCEDURE TRAY) ×2 IMPLANT
KIT TURNOVER KIT B (KITS) ×2 IMPLANT
KWIRE FXSTD 280X2XNS SS (WIRE) IMPLANT
NS IRRIG 1000ML POUR BTL (IV SOLUTION) ×2 IMPLANT
PACK TOTAL JOINT (CUSTOM PROCEDURE TRAY) ×2 IMPLANT
PAD ARMBOARD 7.5X6 YLW CONV (MISCELLANEOUS) ×4 IMPLANT
PAD CAST 4YDX4 CTTN HI CHSV (CAST SUPPLIES) ×1 IMPLANT
PADDING CAST COTTON 4X4 STRL (CAST SUPPLIES) ×2
PADDING CAST COTTON 6X4 STRL (CAST SUPPLIES) ×2 IMPLANT
PLATE FEM DIST NCB PP 278MM (Plate) ×1 IMPLANT
SCREW 5.0 70MM (Screw) ×1 IMPLANT
SCREW 5.0 80MM (Screw) ×4 IMPLANT
SCREW NCB 3.5X75X5X6.2XST (Screw) IMPLANT
SCREW NCB 4.0 32MM (Screw) ×1 IMPLANT
SCREW NCB 5.0X34MM (Screw) ×2 IMPLANT
SCREW NCB 5.0X75MM (Screw) ×2 IMPLANT
SPONGE LAP 18X18 RF (DISPOSABLE) IMPLANT
STAPLER VISISTAT 35W (STAPLE) ×2 IMPLANT
SUCTION FRAZIER HANDLE 10FR (MISCELLANEOUS) ×2
SUCTION TUBE FRAZIER 10FR DISP (MISCELLANEOUS) ×1 IMPLANT
SUT ETHILON 3 0 PS 1 (SUTURE) ×4 IMPLANT
SUT VIC AB 0 CT1 27 (SUTURE)
SUT VIC AB 0 CT1 27XBRD ANBCTR (SUTURE) IMPLANT
SUT VIC AB 1 CT1 27 (SUTURE)
SUT VIC AB 1 CT1 27XBRD ANBCTR (SUTURE) IMPLANT
SUT VIC AB 2-0 CT1 27 (SUTURE) ×4
SUT VIC AB 2-0 CT1 TAPERPNT 27 (SUTURE) ×2 IMPLANT
TOWEL GREEN STERILE (TOWEL DISPOSABLE) ×4 IMPLANT
TRAY FOLEY MTR SLVR 16FR STAT (SET/KITS/TRAYS/PACK) IMPLANT
WATER STERILE IRR 1000ML POUR (IV SOLUTION) ×4 IMPLANT

## 2021-03-31 NOTE — Transfer of Care (Signed)
Immediate Anesthesia Transfer of Care Note  Patient: Casey Watts  Procedure(s) Performed: OPEN REDUCTION INTERNAL FIXATION (ORIF) DISTAL FEMUR FRACTURE (Right )  Patient Location: PACU  Anesthesia Type:General  Level of Consciousness: drowsy and patient cooperative  Airway & Oxygen Therapy: Patient Spontanous Breathing  Post-op Assessment: Report given to RN and Post -op Vital signs reviewed and stable  Post vital signs: Reviewed and stable  Last Vitals:  Vitals Value Taken Time  BP    Temp    Pulse    Resp    SpO2      Last Pain:  Vitals:   03/31/21 0914  TempSrc: Oral  PainSc:          Complications: No complications documented.

## 2021-03-31 NOTE — Plan of Care (Signed)

## 2021-03-31 NOTE — Progress Notes (Signed)
HOSPITAL MEDICINE OVERNIGHT EVENT NOTE    Notified by nursing that patient's hemoglobin is 7.1 this morning.  Review of hemoglobin trend is quite concerning.  Hemoglobin was 12.4 three weeks ago, was 9.9 two days ago and is currently 7.1 now.  Of note, patient was recently initiated on Eliquis.  Patient has not exhibited any chest pain or shortness of breath with this downward trend in hemoglobin.  Patient denies exhibited any obvious evidence of gross bleeding according to nursing.  Patient is to go for operative intervention later today.  Considering the ongoing downward trend in hemoglobin we will transfuse patient with 1 unit of packed red blood cells now.  Will defer consideration of contacting GI for anemia work-up to day team.   Vernelle Emerald  MD [facility]

## 2021-03-31 NOTE — Op Note (Signed)
Orthopaedic Surgery Operative Note (CSN: [id number] ) Date of Surgery: 03/31/2021  Admit Date: 03/29/2021   Diagnoses: Pre-Op Diagnoses: Right supracondylar femur fracture   Post-Op Diagnosis: Same  Procedures: CPT 44315-QMGQ reduction internal fixation of right supracondylar femur fracture   Surgeons : Primary: Shona Needles, MD  Assistant: Patrecia Pace, PA-C  Location: OR 7   Anesthesia:General  Antibiotics: Ancef 2g preop with 1 gm vancomycin powder placed topically   Tourniquet time:None  Estimated Blood Loss: 676PP  Complications:None   Specimens:None   Implants: Implant Name Type Inv. Item Serial No. Manufacturer Lot No. LRB No. Used Action  PLATE FEM DIST NCB PP 278MM - [id number] Plate PLATE FEM DIST NCB PP 278MM  [employer](ORTH,TRAU,BIO,SG)  Right 1 Implanted  CAP LOCK NCB - [id number] Cap CAP LOCK NCB  [employer](ORTH,TRAU,BIO,SG)  Right 7 Implanted  SCREW 5.0 80MM - [id number] Screw SCREW 5.0 80MM  [employer](ORTH,TRAU,BIO,SG)  Right 4 Implanted  SCREW NCB 5.0X34MM - [id number] Screw SCREW NCB 5.0X34MM  [employer](ORTH,TRAU,BIO,SG)  Right 2 Implanted  SCREW NCB 5.0X75MM - [id number] Screw SCREW NCB 5.0X75MM  [employer](ORTH,TRAU,BIO,SG)  Right 1 Implanted  SCREW 5.0 70MM - [id number] Screw SCREW 5.0 70MM  [employer](ORTH,TRAU,BIO,SG)  Right 1 Implanted  SCREW NCB 4.0 32MM - [id number] Screw SCREW NCB 4.0 32MM  [employer](ORTH,TRAU,BIO,SG)  Right 1 Implanted     Indications for Surgery: 87 year old female with a history of dementia who has a right supracondylar femur fracture.  Due to the unstable nature of her injury and the palliative nature of her fracture I recommended proceeding with open reduction internal fixation.  Risks and benefits were discussed with the patient's son.  Risks include but not limited to bleeding, infection, malunion, nonunion, hardware failure, hardware irritation, nerve or blood vessel injury, knee stiffness, even the  possibility of anesthetic complications.  The patient agreed to proceed with surgery.  Operative Findings: Open reduction internal fixation of right femoral shaft fracture using [employer] NCB distal femoral locking plate.  Procedure: The patient was identified in the preoperative holding area. Consent was confirmed with the patient and their family and all questions were answered. The operative extremity was marked after confirmation with the patient. she was then brought back to the operating room by our anesthesia colleagues.  She was placed under general anesthetic and carefully transferred over to a radiolucent flat top table.  A bump was placed under her operative hip.  The right lower extremity was then prepped and draped in usual sterile fashion.  Timeout was performed to verify the patient, the procedure, and the extremity.  Preoperative antibiotics were dosed.  The hip and knee were flexed over a triangle fluoroscopic imaging was obtained that showed the unstable nature of her injury.  A lateral approach to the distal femur was carried down through skin and subcutaneous tissue.  I developed the plane between the vastus lateralis and the lateral shaft of the femur.  Providing some reduction using traction I then slid the plate on the targeting arm along the lateral cortex of the femur.  I held provisionally distally with a 2.0 mm K wire.  I held it proximally with a 3.3 mm drill bit to align the proximal portion of the plate.  I returned to the distal segment and placed two 5.0 millimeter screws to bring the distal portion of plate flush to bone.  I then placed a 5.0 millimeter screws into the femoral shaft.  I then removed the 3.3 mm drill bit  and placed a 4.0 millimeter screw.  I placed locking caps on the 5.0 millimeter screws in the femoral shaft.    I removed the targeting arm and placed four 5.0 millimeter screws distally.  I placed locking caps along the distal screws.  Final  fluoroscopic imaging was obtained.  The incision was copiously irrigated.  A gram of vancomycin powder was placed into the incision and a layered closure of 0 Vicryl, 2-0 Vicryl, 3-0 Monocryl and Dermabond was used to close the skin.  Sterile dressings were placed.  Patient was then awoken from anesthesia and taken to the PACU in stable condition.   Post Op Plan/Instructions: Patient will be weightbearing as tolerated to the right lower extremity.  She will receive postoperative Ancef.  She will be started on her Eliquis once her hemoglobin has stabilized.  We will have her mobilize with physical and Occupational Therapy.  I was present and performed the entire surgery.  Patrecia Pace, PA-C did assist me throughout the case. An assistant was necessary given the difficulty in approach, maintenance of reduction and ability to instrument the fracture.   Katha Hamming, MD [facility]

## 2021-03-31 NOTE — Progress Notes (Signed)
PROGRESS NOTE    Casey Watts  ERX:[id number] DOB: 1924/06/22 DOA: 03/29/2021 PCP: Pcp, No    Brief Narrative:  Patient is a 87 year old female with history of anemia, chronic duodenal ulcer, chronic pain syndrome, constipation, diabetes type 2, GERD, hypertension, CKD who presented after a fall at her assisted living facility.  She was about to be transferred from 1 chair to another and sustained fall during that time.  X-rays showed right comminuted distal femur fracture.  Orthopedics consulted.  Family unsure consenting for surgery right away so family discussing with orthopedics about risks and benefits before proceeding for surgery..  4/27-plan for surgery today. S/p ! Unit PRBC overnight.     Consultants:   orthopedics  Procedures:   Antimicrobials:       Subjective: Pt eyes closed , opens eyes, denies pain, sob, cp  Objective: Vitals:   03/30/21 2012 03/31/21 0311 03/31/21 0642 03/31/21 0657  BP: (!) 88/40 (!) 100/43 98/63 (!) 91/45  Pulse: 92 71 75 81  Resp: 16 16 18 17   Temp: 98.2 F (36.8 C) 97.9 F (36.6 C) (!) 97.4 F (36.3 C) 98.4 F (36.9 C)  TempSrc: Oral Oral Oral Oral  SpO2: 96% 93% 92% 92%  Weight:      Height:        Intake/Output Summary (Last 24 hours) at 03/31/2021 0812 Last data filed at 03/31/2021 0400 Gross per 24 hour  Intake 1233.49 ml  Output 200 ml  Net 1033.49 ml   Filed Weights   03/29/21 2300  Weight: 51.7 kg    Examination:  General exam: Appears calm and comfortable  Respiratory system: Clear to auscultation. Respiratory effort normal. Cardiovascular system: S1 & S2 heard, RRR. No JVD, murmurs, rubs, gallops or clicks.  Gastrointestinal system: Abdomen is nondistended, soft and nontender. Normal bowel sounds heard. Central nervous system: grossly intact Extremities: no edema Skin: warm,d ry      Data Reviewed: I have personally reviewed following labs and imaging studies  CBC: Recent Labs  Lab 03/29/21 1749  03/30/21 0131 03/31/21 0251  WBC 14.8* 12.0* 11.9*  NEUTROABS 12.7*  --  8.0*  HGB 9.9* 8.6* 7.1*  HCT 31.1* 26.7* 21.4*  MCV 101.3* 100.0 100.9*  PLT 269 223 950   Basic Metabolic Panel: Recent Labs  Lab 03/29/21 1749 03/30/21 0131 03/31/21 0251  NA 137 137 136  K 4.3 4.0 3.9  CL 98 101 101  CO2 27 30 28   GLUCOSE 190* 122* 82  BUN 19 20 24*  CREATININE 1.14* 1.12* 1.10*  CALCIUM 8.0* 7.9* 7.8*   GFR: Estimated Creatinine Clearance: 23.1 mL/min (A) (by C-G formula based on SCr of 1.1 mg/dL (H)). Liver Function Tests: No results for input(s): AST, ALT, ALKPHOS, BILITOT, PROT, ALBUMIN in the last 168 hours. No results for input(s): LIPASE, AMYLASE in the last 168 hours. No results for input(s): AMMONIA in the last 168 hours. Coagulation Profile: No results for input(s): INR, PROTIME in the last 168 hours. Cardiac Enzymes: No results for input(s): CKTOTAL, CKMB, CKMBINDEX, TROPONINI in the last 168 hours. BNP (last 3 results) No results for input(s): PROBNP in the last 8760 hours. HbA1C: Recent Labs    03/30/21 0131  HGBA1C 5.8*   CBG: Recent Labs  Lab 03/30/21 1158 03/30/21 1616 03/30/21 2015 03/31/21 0015 03/31/21 0439  GLUCAP 89 141* 108* 103* 80   Lipid Profile: No results for input(s): CHOL, HDL, LDLCALC, TRIG, CHOLHDL, LDLDIRECT in the last 72 hours. Thyroid Function Tests: No results  for input(s): TSH, T4TOTAL, FREET4, T3FREE, THYROIDAB in the last 72 hours. Anemia Panel: Recent Labs    03/30/21 0131  VITAMINB12 553  FOLATE 35.8   Sepsis Labs: No results for input(s): PROCALCITON, LATICACIDVEN in the last 168 hours.  Recent Results (from the past 240 hour(s))  Resp Panel by RT-PCR (Flu A&B, Covid) Nasopharyngeal Swab     Status: None   Collection Time: 03/29/21  5:49 PM   Specimen: Nasopharyngeal Swab; Nasopharyngeal(NP) swabs in vial transport medium  Result Value Ref Range Status   SARS Coronavirus 2 by RT PCR NEGATIVE NEGATIVE Final     Comment: (NOTE) SARS-CoV-2 target nucleic acids are NOT DETECTED.  The SARS-CoV-2 RNA is generally detectable in upper respiratory specimens during the acute phase of infection. The lowest concentration of SARS-CoV-2 viral copies this assay can detect is 138 copies/mL. A negative result does not preclude SARS-Cov-2 infection and should not be used as the sole basis for treatment or other patient management decisions. A negative result may occur with  improper specimen collection/handling, submission of specimen other than nasopharyngeal swab, presence of viral mutation(s) within the areas targeted by this assay, and inadequate number of viral copies(<138 copies/mL). A negative result must be combined with clinical observations, patient history, and epidemiological information. The expected result is Negative.  Fact Sheet for Patients:  EntrepreneurPulse.com.au  Fact Sheet for Healthcare Providers:  IncredibleEmployment.be  This test is no t yet approved or cleared by the [place] FDA and  has been authorized for detection and/or diagnosis of SARS-CoV-2 by FDA under an Emergency Use Authorization (EUA). This EUA will remain  in effect (meaning this test can be used) for the duration of the COVID-19 declaration under Section 564(b)(1) of the Act, 21 U.S.C.section 360bbb-3(b)(1), unless the authorization is terminated  or revoked sooner.       Influenza A by PCR NEGATIVE NEGATIVE Final   Influenza B by PCR NEGATIVE NEGATIVE Final    Comment: (NOTE) The Xpert Xpress SARS-CoV-2/FLU/RSV plus assay is intended as an aid in the diagnosis of influenza from Nasopharyngeal swab specimens and should not be used as a sole basis for treatment. Nasal washings and aspirates are unacceptable for Xpert Xpress SARS-CoV-2/FLU/RSV testing.  Fact Sheet for Patients: EntrepreneurPulse.com.au  Fact Sheet for Healthcare  Providers: IncredibleEmployment.be  This test is not yet approved or cleared by the [place] FDA and has been authorized for detection and/or diagnosis of SARS-CoV-2 by FDA under an Emergency Use Authorization (EUA). This EUA will remain in effect (meaning this test can be used) for the duration of the COVID-19 declaration under Section 564(b)(1) of the Act, 21 U.S.C. section 360bbb-3(b)(1), unless the authorization is terminated or revoked.  Performed at [facility], [place] [address], [place], [place] [address]   Surgical pcr screen     Status: None   Collection Time: 03/30/21 12:50 AM   Specimen: Nasal Mucosa; Nasal Swab  Result Value Ref Range Status   MRSA, PCR NEGATIVE NEGATIVE Final   Staphylococcus aureus NEGATIVE NEGATIVE Final    Comment: (NOTE) The Xpert SA Assay (FDA approved for NASAL specimens in patients 85 years of age and older), is one component of a comprehensive surveillance program. It is not intended to diagnose infection nor to guide or monitor treatment. Performed at [facility], [place] [address] [place], [place] [address]          Radiology Studies: DG Chest 1 View  Result Date: 03/29/2021 CLINICAL DATA:  Fall.  EXAM: CHEST  1 VIEW COMPARISON:  03/20/2018 FINDINGS: Normal cardiac silhouette. Linear atelectasis in the RIGHT midlung. Lungs are hyperinflated. No effusion infiltrate or pneumothorax identified. LEFT shoulder arthroplasty noted. IMPRESSION: 1. No evidence of thoracic trauma. 2. RIGHT lung linear atelectasis. Electronically Signed   By: Suzy Bouchard M.D.   On: 03/29/2021 17:35   DG Pelvis 1-2 Views  Result Date: 03/29/2021 CLINICAL DATA:  87 year old female with fall. EXAM: PELVIS - 1-2 VIEW COMPARISON:  None. FINDINGS: Evaluation is limited due to advanced osteopenia and superimposition of the right hand over the right femur trochanter. No acute fracture identified. There is no dislocation. Moderate left  and advanced right hip arthritic changes. Degenerative changes of the lower lumbar spine. The soft tissues are unremarkable. IMPRESSION: No acute fracture or dislocation. Electronically Signed   By: Anner Crete M.D.   On: 03/29/2021 17:29   CT Head Wo Contrast  Result Date: 03/29/2021 CLINICAL DATA:  Fall.  Head trauma EXAM: CT HEAD WITHOUT CONTRAST TECHNIQUE: Contiguous axial images were obtained from the base of the skull through the vertex without intravenous contrast. COMPARISON:  None. FINDINGS: Brain: No acute intracranial hemorrhage. No focal mass lesion. No CT evidence of acute infarction. No midline shift or mass effect. No hydrocephalus. Basilar cisterns are patent. There are periventricular and subcortical white matter hypodensities. Generalized cortical atrophy. Vascular: No hyperdense vessel or unexpected calcification. Skull: Normal. Negative for fracture or focal lesion. Sinuses/Orbits: Paranasal sinuses and mastoid air cells are clear. Orbits are clear. Other: None. IMPRESSION: 1. No intracranial trauma. 2. Atrophy and white matter microvascular disease Electronically Signed   By: Suzy Bouchard M.D.   On: 03/29/2021 18:01   CT Lumbar Spine Wo Contrast  Result Date: 03/29/2021 CLINICAL DATA:  87 year old post fall when transferring from 1 chair to another. EXAM: CT LUMBAR SPINE WITHOUT CONTRAST TECHNIQUE: Multidetector CT imaging of the lumbar spine was performed without intravenous contrast administration. Multiplanar CT image reconstructions were also generated. COMPARISON:  None. FINDINGS: Segmentation: 5 lumbar type vertebrae.  S1 is partially lumbarized. Alignment: Broad-based dextroscoliotic curvature. 4 mm anterolisthesis of L5 on S1. Vertebrae: No acute fracture. No vertebral body compression. None fusion posterior elements of S1. No fracture of included sacrum. Bones diffusely under mineralized. Paraspinal and other soft tissues: No acute or traumatic findings. Fatty atrophy  of paraspinal musculature. Aortic atherosclerosis. Disc levels: Diffuse degenerative disc disease with disc space narrowing and endplate spurring. There is multilevel facet hypertrophy. Mild bony canal narrowing at L4-L5. No high-grade canal stenosis. IMPRESSION: 1. No acute fracture of the lumbar spine. 2. Broad-based dextroscoliotic curvature with multilevel degenerative disc disease and facet hypertrophy. Aortic Atherosclerosis (ICD10-I70.0). Electronically Signed   By: Keith Rake M.D.   On: 03/29/2021 17:58   DG Knee Complete 4 Views Right  Result Date: 03/29/2021 CLINICAL DATA:  Fall.  87 year old female EXAM: RIGHT KNEE - COMPLETE 4+ VIEW COMPARISON:  None. FINDINGS: Oblique fracture through the distal femoral metadiaphysis with medial displacement of the distal fracture fragment by 1 bone width. Mild comminution along the fracture plane which enters the proximal metaphysis. IMPRESSION: Oblique comminuted displaced fracture of the distal femur as above. Electronically Signed   By: Suzy Bouchard M.D.   On: 03/29/2021 17:29   DG FEMUR, MIN 2 VIEWS RIGHT  Result Date: 03/29/2021 CLINICAL DATA:  87 year old female with fall and trauma to the right lower extremity. EXAM: RIGHT FEMUR 2 VIEWS COMPARISON:  Pelvic radiograph dated 03/29/2021. FINDINGS: There is a comminuted oblique fracture of  the distal femoral diaphysis. There is medial and anterior angulation of the major distal fracture fragment. No dislocation. There is advanced osteopenia with severe degenerative changes of the right knee. There is soft tissue swelling of the anterior soft tissues above the knee with probable small suprapatellar effusion. Vascular calcification. IMPRESSION: Comminuted, displaced and angulated, oblique fracture of the distal femoral diaphysis. Electronically Signed   By: Anner Crete M.D.   On: 03/29/2021 17:32        Scheduled Meds: . cholecalciferol  1,000 Units Oral Q0600  . insulin aspart  0-9  Units Subcutaneous TID WC  . levothyroxine  200 mcg Oral Q0600  . multivitamin with minerals  1 tablet Oral Q0600  . pantoprazole  20 mg Oral Daily  . povidone-iodine  2 application Topical Once   Continuous Infusions: .  ceFAZolin (ANCEF) IV      Assessment & Plan:   Principal Problem:   Closed fracture of right distal femur (HCC) Active Problems:   Chronic duodenal ulcer   GERD (gastroesophageal reflux disease)   Hypothyroidism   Hypertension   Renal insufficiency   Leukocytosis   Chronic pain   Diabetes mellitus type 2 in nonobese (HCC)   Chronic anemia   Iron deficiency anemia   Closed fracture of right distal femur: Fell while being transferred from 1 chair to another.  X-ray showed comminuted distal femur fracture. 4/27-orthopedics following S/p ORIF today Bowel regimen PT when cleared by ortho scd for dvt ppx.  Leukocytosis: Most likely reactive.  Afebrile improving No indication for antibiotic treatment.  Monitor  Recent history of DVT: Recently diagnosed and was started Eliquis 3 weeks ago.  Will resume when cleared by surgery.   Chronic normocytic anemia: Has history of iron deficiency anemia. 4/27-Hg 7.1 , s/p PRBC this am prior to going for surgery Eliquis on hold for surgery Will obtain stool occut.   Hypertension: History of hypertension.  Blood pressure currently soft.  Likely associated with pain medications.  She had to be given fluid boluses. continue to monitor, minimize narcotics.  Home antihypertensives on hold  GERD/chronic duodenal ulcer:continue PPI  Diabetes type 2: On sliding scale insulin.  Monitor blood sugars  CKD stage II: Currently kidney function stable and at baseline.  Avoid  Nephrotoxins.  Dementia/debility: Advanced.  Confused at baseline.  She cannot ambulate and is chair bound.  Confused at baseline.  She lives in an ALF.    DVT prophylaxis: scd Code Status:DNR Family Communication: family at bedside  Status  is: Inpatient  Remains inpatient appropriate because:Inpatient level of care appropriate due to severity of illness   Dispo: The patient is from: ALF              Anticipated d/c is to: SNF              Patient currently is not medically stable to d/c.   Difficult to place patient No            LOS: 2 days   Time spent: 35 min with >50% on coc     Nolberto Hanlon, MD [facility] Pager [phone]  If 7PM-7AM, please contact night-coverage 03/31/2021, 8:12 AM

## 2021-03-31 NOTE — Interval H&P Note (Signed)
History and Physical Interval Note:  03/31/2021 9:09 AM  Casey Watts  has presented today for surgery, with the diagnosis of Right distal femur fx.  The various methods of treatment have been discussed with the patient and family. After consideration of risks, benefits and other options for treatment, the patient has consented to  Procedure(s): OPEN REDUCTION INTERNAL FIXATION (ORIF) DISTAL FEMUR FRACTURE (Right) as a surgical intervention.  The patient's history has been reviewed, patient examined, no change in status, stable for surgery.  I have reviewed the patient's chart and labs.  Questions were answered to the patient's satisfaction.     Lennette Bihari P Ugo Thoma

## 2021-03-31 NOTE — Anesthesia Postprocedure Evaluation (Signed)
Anesthesia Post Note  Patient: Casey Watts  Procedure(s) Performed: OPEN REDUCTION INTERNAL FIXATION (ORIF) DISTAL FEMUR FRACTURE (Right )     Patient location during evaluation: PACU Anesthesia Type: General Level of consciousness: awake and alert, oriented and patient cooperative Pain management: pain level controlled Vital Signs Assessment: post-procedure vital signs reviewed and stable Respiratory status: spontaneous breathing, nonlabored ventilation and respiratory function stable Cardiovascular status: blood pressure returned to baseline and stable Postop Assessment: no apparent nausea or vomiting Anesthetic complications: no   No complications documented.  Last Vitals:  Vitals:   03/31/21 1240 03/31/21 1316  BP: (!) 106/48 122/61  Pulse: 86 76  Resp: 16 16  Temp: 36.6 C 36.7 C  SpO2: 90% 93%    Last Pain:  Vitals:   03/31/21 1316  TempSrc: Oral  PainSc:                  Pervis Hocking

## 2021-03-31 NOTE — Anesthesia Preprocedure Evaluation (Addendum)
Anesthesia Evaluation  Patient identified by MRN, date of birth, ID band Patient awake    Reviewed: Allergy & Precautions, NPO status , Patient's Chart, lab work & pertinent test results  Airway Mallampati: II  TM Distance: >3 FB Neck ROM: Full    Dental no notable dental hx. (+) Chipped, Missing, Dental Advisory Given, Poor Dentition,    Pulmonary asthma (well controlled per son, no inhalers) ,    Pulmonary exam normal breath sounds clear to auscultation       Cardiovascular hypertension, Pt. on medications +CHF (grade 1 diastolic dysfunction, normal LVEF) and + DVT (on eliquis- LD)  Normal cardiovascular exam+ Valvular Problems/Murmurs (mild MR, mild AS) MR and AS  Rhythm:Regular Rate:Normal  Last echo 2018: - Left ventricle: The cavity size was normal. There was mild focal  basal hypertrophy of the septum. Systolic function was vigorous.  The estimated ejection fraction was in the range of 65% to 70%.  Wall motion was normal; there were no regional wall motion  abnormalities. Doppler parameters are consistent with abnormal  left ventricular relaxation (grade 1 diastolic dysfunction).  Doppler parameters are consistent with high ventricular filling  pressure.  - Aortic valve: There was mild stenosis.  - Mitral valve: Calcified annulus. There was mild regurgitation.  - Left atrium: The atrium was mildly dilated.  - Pulmonary arteries: Systolic pressure was moderately increased.   Neuro/Psych PSYCHIATRIC DISORDERS Dementia negative neurological ROS     GI/Hepatic PUD, GERD  Controlled and Medicated,  Endo/Other  diabetes, Well Controlled, Type 2, Oral Hypoglycemic AgentsHypothyroidism   Renal/GU Renal InsufficiencyRenal diseaseCr 1.1  negative genitourinary   Musculoskeletal  (+) Arthritis , Osteoarthritis,  Right distal femur fx   Abdominal   Peds  Hematology  (+) Blood dyscrasia, anemia , hct 21.4,  plt 226   Anesthesia Other Findings   Reproductive/Obstetrics negative OB ROS                            Anesthesia Physical Anesthesia Plan  ASA: IV  Anesthesia Plan: General   Post-op Pain Management:    Induction: Intravenous  PONV Risk Score and Plan: 3 and Ondansetron, Dexamethasone and Treatment may vary due to age or medical condition  Airway Management Planned: Oral ETT  Additional Equipment: None  Intra-op Plan:   Post-operative Plan: Extubation in OR  Informed Consent: I have reviewed the patients History and Physical, chart, labs and discussed the procedure including the risks, benefits and alternatives for the proposed anesthesia with the patient or authorized representative who has indicated his/her understanding and acceptance.   Patient has DNR.  Discussed DNR with patient, Discussed DNR with power of attorney and Suspend DNR.   Dental advisory given and Consent reviewed with POA  Plan Discussed with: CRNA  Anesthesia Plan Comments: (Progressively more anemic since starting eliquis for LE DVT- for eventual GI w/u, given 1 unit prbc this AM D/w son Rush Landmark consent for anesthesia. Family is okay with intubation for procedure and medications to augment blood pressure and heart rate. They wish to continue to withhold chest compressions and defibrillation. They also wish to avoid prolonged intubation.  )       Anesthesia Quick Evaluation

## 2021-03-31 NOTE — Anesthesia Procedure Notes (Signed)
Procedure Name: Intubation Date/Time: 03/31/2021 10:06 AM Performed by: Oletta Lamas, CRNA Pre-anesthesia Checklist: Patient identified, Emergency Drugs available, Suction available and Patient being monitored Patient Re-evaluated:Patient Re-evaluated prior to induction Oxygen Delivery Method: Circle System Utilized Preoxygenation: Pre-oxygenation with 100% oxygen Induction Type: IV induction Ventilation: Mask ventilation without difficulty Laryngoscope Size: Mac and 3 Grade View: Grade III Tube type: Oral Number of attempts: 2 Airway Equipment and Method: Stylet Placement Confirmation: ETT inserted through vocal cords under direct vision,  positive ETCO2 and breath sounds checked- equal and bilateral Secured at: 22 cm Tube secured with: Tape Dental Injury: Teeth and Oropharynx as per pre-operative assessment

## 2021-04-01 ENCOUNTER — Encounter (HOSPITAL_COMMUNITY): Payer: Self-pay | Admitting: Student

## 2021-04-01 DIAGNOSIS — G8929 Other chronic pain: Secondary | ICD-10-CM | POA: Diagnosis not present

## 2021-04-01 DIAGNOSIS — S72401K Unspecified fracture of lower end of right femur, subsequent encounter for closed fracture with nonunion: Secondary | ICD-10-CM | POA: Diagnosis not present

## 2021-04-01 DIAGNOSIS — D649 Anemia, unspecified: Secondary | ICD-10-CM | POA: Diagnosis not present

## 2021-04-01 DIAGNOSIS — K267 Chronic duodenal ulcer without hemorrhage or perforation: Secondary | ICD-10-CM | POA: Diagnosis not present

## 2021-04-01 LAB — URINALYSIS, ROUTINE W REFLEX MICROSCOPIC
Bilirubin Urine: NEGATIVE
Glucose, UA: NEGATIVE mg/dL
Ketones, ur: 5 mg/dL — AB
Nitrite: NEGATIVE
Protein, ur: 100 mg/dL — AB
Specific Gravity, Urine: 1.025 (ref 1.005–1.030)
Squamous Epithelial / HPF: >50 — ABNORMAL HIGH (ref 0–5)
WBC, UA: >50 WBC/hpf — ABNORMAL HIGH (ref 0–5)
pH: 5 (ref 5.0–8.0)

## 2021-04-01 LAB — BASIC METABOLIC PANEL
Anion gap: 8 (ref 5–15)
BUN: 28 mg/dL — ABNORMAL HIGH (ref 8–23)
CO2: 25 mmol/L (ref 22–32)
Calcium: 7.6 mg/dL — ABNORMAL LOW (ref 8.9–10.3)
Chloride: 104 mmol/L (ref 98–111)
Creatinine, Ser: 1.1 mg/dL — ABNORMAL HIGH (ref 0.44–1.00)
GFR, Estimated: 46 mL/min — ABNORMAL LOW (ref >60)
Glucose, Bld: 105 mg/dL — ABNORMAL HIGH (ref 70–99)
Potassium: 4.9 mmol/L (ref 3.5–5.1)
Sodium: 137 mmol/L (ref 135–145)

## 2021-04-01 LAB — POCT I-STAT, CHEM 8
BUN: 24 mg/dL — ABNORMAL HIGH (ref 8–23)
Calcium, Ion: 1.21 mmol/L (ref 1.15–1.40)
Chloride: 99 mmol/L (ref 98–111)
Creatinine, Ser: 1 mg/dL (ref 0.44–1.00)
Glucose, Bld: 102 mg/dL — ABNORMAL HIGH (ref 70–99)
HCT: 24 % — ABNORMAL LOW (ref 36.0–46.0)
Hemoglobin: 8.2 g/dL — ABNORMAL LOW (ref 12.0–15.0)
Potassium: 3.9 mmol/L (ref 3.5–5.1)
Sodium: 138 mmol/L (ref 135–145)
TCO2: 27 mmol/L (ref 22–32)

## 2021-04-01 LAB — TYPE AND SCREEN
ABO/RH(D): O POS
Antibody Screen: NEGATIVE
Unit division: 0

## 2021-04-01 LAB — GLUCOSE, CAPILLARY
Glucose-Capillary: 106 mg/dL — ABNORMAL HIGH (ref 70–99)
Glucose-Capillary: 111 mg/dL — ABNORMAL HIGH (ref 70–99)
Glucose-Capillary: 120 mg/dL — ABNORMAL HIGH (ref 70–99)
Glucose-Capillary: 136 mg/dL — ABNORMAL HIGH (ref 70–99)
Glucose-Capillary: 96 mg/dL (ref 70–99)

## 2021-04-01 LAB — CBC
HCT: 27 % — ABNORMAL LOW (ref 36.0–46.0)
Hemoglobin: 8.6 g/dL — ABNORMAL LOW (ref 12.0–15.0)
MCH: 31.5 pg (ref 26.0–34.0)
MCHC: 31.9 g/dL (ref 30.0–36.0)
MCV: 98.9 fL (ref 80.0–100.0)
Platelets: 282 10*3/uL (ref 150–400)
RBC: 2.73 MIL/uL — ABNORMAL LOW (ref 3.87–5.11)
RDW: 16.7 % — ABNORMAL HIGH (ref 11.5–15.5)
WBC: 16 10*3/uL — ABNORMAL HIGH (ref 4.0–10.5)
nRBC: 0 % (ref 0.0–0.2)

## 2021-04-01 LAB — BPAM RBC
Blood Product Expiration Date: 202205222359
ISSUE DATE / TIME: 202204270634
Unit Type and Rh: 5100

## 2021-04-01 MED ORDER — MIDODRINE HCL 5 MG PO TABS
5.0000 mg | ORAL_TABLET | Freq: Three times a day (TID) | ORAL | Status: DC
  Administered 2021-04-01 – 2021-04-06 (×15): 5 mg via ORAL
  Filled 2021-04-01 (×14): qty 1

## 2021-04-01 MED ORDER — MIDODRINE HCL 5 MG PO TABS
2.5000 mg | ORAL_TABLET | Freq: Three times a day (TID) | ORAL | Status: DC
  Administered 2021-04-01: 2.5 mg via ORAL
  Filled 2021-04-01 (×2): qty 1

## 2021-04-01 MED ORDER — APIXABAN 5 MG PO TABS
5.0000 mg | ORAL_TABLET | Freq: Two times a day (BID) | ORAL | Status: DC
  Administered 2021-04-01 – 2021-04-07 (×12): 5 mg via ORAL
  Filled 2021-04-01 (×13): qty 1

## 2021-04-01 NOTE — Progress Notes (Signed)
ANTICOAGULATION CONSULT NOTE - Initial Consult  Pharmacy Consult for apixaban Indication: Post-op fracture anticoagulation, acute DVT 03/08/21  No Known Allergies  Patient Measurements: Height: 5\' 1"  (154.9 cm) Weight: 51.7 kg (113 lb 15.7 oz) IBW/kg (Calculated) : 47.8   Vital Signs: Temp: 98.6 F (37 C) (04/28 0800) Temp Source: Oral (04/28 0800) BP: 104/48 (04/28 0800) Pulse Rate: 82 (04/28 0800)  Labs: Recent Labs    03/30/21 0131 03/31/21 0251 03/31/21 1035 04/01/21 0857 04/01/21 0903  HGB 8.6* 7.1* 8.2* 8.6*  --   HCT 26.7* 21.4* 24.0* 27.0*  --   PLT 223 226  --  282  --   CREATININE 1.12* 1.10* 1.00  --  1.10*    Estimated Creatinine Clearance: 23.1 mL/min (A) (by C-G formula based on SCr of 1.1 mg/dL (H)).   Medical History: Past Medical History:  Diagnosis Date  . Anemia   . Arthritis   . Asthma    as a child  . Cancer ([place])    skin cancer on left leg  . Cataract   . Cellulitis and abscess of lower extremity 03/21/2018  . Diabetes mellitus    TYPE 2  . Esophageal stricture   . GERD (gastroesophageal reflux disease)   . Glaucoma   . Hypertension   . Hypothyroidism   . Thyroid disease     Assessment: 87 yo W with acute RLE DVT on 03/08/21 on apixaban PTA, which was held for surgery. Pharmacy consulted to restart apixaban POD1 (today) if hgn >8 per Ortho. Hgb is 8.6, received 1 u RBC 4/27. Will resume 5mg  BID given recent DVT. Noted ClCr ~70ml/min, age and weight. Dose appropraite for DVT.    Goal of Therapy:  Monitor platelets by anticoagulation protocol: Yes   Plan:  Restart apixaban 5mg  BID Monitor for signs/symptoms of bleeding   Benetta Spar, PharmD, BCPS, BCCP Clinical Pharmacist  Please check AMION for all [place] phone numbers After 10:00 PM, call [place] [phone]

## 2021-04-01 NOTE — Evaluation (Signed)
Occupational Therapy Evaluation Patient Details Name: COURTLYN AKI MRN: [id number] DOB: 1923-12-12 Today's Date: 04/01/2021    History of Present Illness 87 yo female s/p ORIF R distal femur fx on 4/27 after fall at assisted living facility on 4/25. PMH includes DVT 03/2021 now on eliquis, anemia, chronic duodenal ulcer, chronic pain, constipation, diabetes, GERD, hypertension, hypothyroidism, CKD, cataracts, L RSA 2015, dementia.   Clinical Impression   Pt admitted to ED for concerns listed above. PTA pt was living in an ALF, who provided assistance for functional mobility, basic ADL's, and IADL's. At the time of the evaluation, pt expressed that she was in pain and fearful of moving her R leg. Pt required total assist +2 to come to sitting EOB, with max support to maintain upright posture. Pt continually reported on pain and was fearful of moving further. Pt was moved back to supine in bed with HOB elevated to assess ability to complete basic self care tasks. Speech consulted due to pt coughing/clearing her throat a lot every time she swallowed a sip of drink. Pt to continue to benefit from OT to progress towards higher levels of functional mobility and ADL performance. Acute OT to follow.     Follow Up Recommendations  SNF;Supervision/Assistance - 24 hour    Equipment Recommendations  None recommended by OT    Recommendations for Other Services Speech consult     Precautions / Restrictions Precautions Precautions: Fall;Other (comment) Precaution Comments: Femur fx Restrictions Weight Bearing Restrictions: Yes RLE Weight Bearing: Weight bearing as tolerated      Mobility Bed Mobility Overal bed mobility: Needs Assistance Bed Mobility: Rolling;Supine to Sit;Sit to Supine Rolling: Max assist   Supine to sit: Max assist;+2 for physical assistance;+2 for safety/equipment Sit to supine: Max assist;+2 for physical assistance;+2 for safety/equipment   General bed mobility  comments: OT/PT assisted pt to EOB by pivoting upper body and lower body together. Pt resitive to movement guarding for pain.    Transfers Overall transfer level: Needs assistance Equipment used: 2 person hand held assist             General transfer comment: Pt unable to transfer at this time due to pain and resistance to stand. Pt unable to maintain midline when sitting EOB without support.    Balance Overall balance assessment: Needs assistance Sitting-balance support: Bilateral upper extremity supported;Feet supported Sitting balance-Leahy Scale: Poor Sitting balance - Comments: Pt forcefully leaning backwards sitting EOB. Requiring max support with intermittent min support to maintain midline. Postural control: Posterior lean     Standing balance comment: Pt unable to stand at this time                           ADL either performed or assessed with clinical judgement   ADL Overall ADL's : Needs assistance/impaired Eating/Feeding: Set up;Minimal assistance;Bed level Eating/Feeding Details (indicate cue type and reason): Pt able to bring drink to her mouth supported sitting up with HOB elevated. However, when drinking pt had difficulty swallowing, coughing to clear her throat after every swallow. Grooming: Wash/dry hands;Wash/dry face;Set up;Bed level Grooming Details (indicate cue type and reason): Pt able to wash her face supoort sitting up with HOB elevated Upper Body Bathing: Moderate assistance;Cueing for safety;Cueing for sequencing;Bed level   Lower Body Bathing: Total assistance;+2 for physical assistance;+2 for safety/equipment;Cueing for safety;Cueing for sequencing;Bed level Lower Body Bathing Details (indicate cue type and reason): Pt requires max assist to roll, and due  to pain pt does not like to reach foward to touch her upper legs at this time to assist with bathing. Upper Body Dressing : Minimal assistance;Bed level;Cueing for sequencing   Lower  Body Dressing: Total assistance;Cueing for safety;Cueing for sequencing;Bed level;+2 for physical assistance Lower Body Dressing Details (indicate cue type and reason): Pt unable to reach down to don/doff socks/underwear at this time, requiring max assist to roll for any underwear donning/doffing Toilet Transfer: Total assistance;+2 for physical assistance;+2 for safety/equipment;Squat-pivot;BSC Toilet Transfer Details (indicate cue type and reason): Unable to transfer due to Maili and Hygiene: Total assistance;Cueing for sequencing;Bed level   Tub/ Shower Transfer: Total assistance;+2 for physical assistance;+2 for safety/equipment;Squat-pivot;Tub bench Tub/Shower Transfer Details (indicate cue type and reason): Unable to transfer due to pain/resistance Functional mobility during ADLs: Total assistance;+2 for physical assistance;+2 for safety/equipment General ADL Comments: Pt resistant to movement due to pain. When seated EOB pt forcefully pushed back requiring max support to remain sitting upright EOB. Without support at this time pt would be unable to sit and complete basic ADL's.     Vision Baseline Vision/History: Wears glasses Wears Glasses: At all times Patient Visual Report: No change from baseline Vision Assessment?: No apparent visual deficits     Perception Perception Perception Tested?: No   Praxis Praxis Praxis tested?: Not tested    Pertinent Vitals/Pain Pain Assessment: Faces Faces Pain Scale: Hurts whole lot Pain Location: RLE Pain Descriptors / Indicators: Discomfort;Grimacing;Guarding;Moaning Pain Intervention(s): Monitored during session;Repositioned;Ice applied     Hand Dominance Right   Extremity/Trunk Assessment Upper Extremity Assessment Upper Extremity Assessment: Generalized weakness   Lower Extremity Assessment Lower Extremity Assessment: Defer to PT evaluation   Cervical / Trunk Assessment Cervical /  Trunk Assessment: Normal   Communication Communication Communication: No difficulties   Cognition Arousal/Alertness: Awake/alert Behavior During Therapy: Anxious Overall Cognitive Status: History of cognitive impairments - at baseline                                 General Comments: Pt is a poor historian at baseline, unable to provide much insight into prior levels of functioning or living situation. Pleasantly confused. Resistant against movement due to pain.   General Comments  VSS on RA. Supine BP 138/111 (map 121), Sitting BP 129/75 (map 88)    Exercises     Shoulder Instructions      Home Living Family/patient expects to be discharged to:: Assisted living                             Home Equipment: Bedside commode;Tub bench;Grab bars - toilet;Grab bars - tub/shower;Hand held shower head;Wheelchair - manual          Prior Functioning/Environment Level of Independence: Needs assistance  Gait / Transfers Assistance Needed: Pt has been nonambulatory for 3+ years. She is able to stand and pivot to transfer to [facility], recliner, bsc, etc. ALF provides assistance with all functional mobility and transfers. ADL's / Homemaking Assistance Needed: Pt recieves assistance with all meals, cleaning, and medication managment from ALF/Family. Basic ADL's pt participates in, however per chart requires assistance for all bathing dressing, and set up for grooming.            OT Problem List: Decreased strength;Decreased range of motion;Decreased activity tolerance;Impaired balance (sitting and/or standing);Decreased coordination;Decreased cognition;Decreased safety awareness;Decreased knowledge of use of DME or AE;Decreased  knowledge of precautions;Pain      OT Treatment/Interventions: Self-care/ADL training;Therapeutic exercise;Energy conservation;DME and/or AE instruction;Therapeutic activities;Cognitive remediation/compensation;Patient/family education;Balance  training    OT Goals(Current goals can be found in the care plan section) Acute Rehab OT Goals Patient Stated Goal: To stop hurting OT Goal Formulation: Patient unable to participate in goal setting Time For Goal Achievement: 04/15/21 Potential to Achieve Goals: Fair ADL Goals Pt Will Perform Grooming: with supervision;sitting Pt Will Transfer to Toilet: with mod assist;stand pivot transfer;bedside commode Additional ADL Goal #1: Pt will tolerate sitting EOB for 3+ mins unsupported in preparation for seated ADL's  OT Frequency: Min 2X/week   Barriers to D/C:            Co-evaluation PT/OT/SLP Co-Evaluation/Treatment: Yes Reason for Co-Treatment: Complexity of the patient's impairments (multi-system involvement);For patient/therapist safety;To address functional/ADL transfers PT goals addressed during session: Mobility/safety with mobility;Balance;Strengthening/ROM OT goals addressed during session: ADL's and self-care;Proper use of Adaptive equipment and DME      AM-PAC OT "6 Clicks" Daily Activity     Outcome Measure Help from another person eating meals?: A Little Help from another person taking care of personal grooming?: A Little Help from another person toileting, which includes using toliet, bedpan, or urinal?: Total Help from another person bathing (including washing, rinsing, drying)?: Total Help from another person to put on and taking off regular upper body clothing?: A Little Help from another person to put on and taking off regular lower body clothing?: Total 6 Click Score: 12   End of Session Nurse Communication: Mobility status;Other (comment) (Pt BP)  Activity Tolerance: Patient limited by pain Patient left: in bed;with call bell/phone within reach;with bed alarm set  OT Visit Diagnosis: Unsteadiness on feet (R26.81);Repeated falls (R29.6);Muscle weakness (generalized) (M62.81);History of falling (Z91.81)                Time: 5956-3875 OT Time Calculation  (min): 21 min Charges:  OT General Charges $OT Visit: 1 Visit OT Evaluation $OT Eval Moderate Complexity: 1 Mod  Theresa Wedel H., OTR/L Acute Rehabilitation  Ayauna Mcnay Elane Yolanda Bonine 04/01/2021, 1:46 PM

## 2021-04-01 NOTE — Evaluation (Signed)
Physical Therapy Evaluation Patient Details Name: Casey Watts MRN: [id number] DOB: 06-07-1924 Today's Date: 04/01/2021   History of Present Illness  87 yo female s/p ORIF R distal femur fx on 4/27 after fall at assisted living facility on 4/25. PMH includes DVT 03/2021 now on eliquis, anemia, chronic duodenal ulcer, chronic pain, constipation, diabetes, GERD, hypertension, hypothyroidism, CKD, cataracts, L RSA 2015, dementia.  Clinical Impression   Pt presents with generalized weakness, severe RLE post-operative pain, difficulty performing bed mobility tasks, inability to transfer OOB today, and decreased activity tolerance. Pt with baseline dementia, not oriented to anything but self at this time. Pt to benefit from acute PT to address deficits. Pt requiring max-total +2 assist for moving to/from EOB, pt resistant to mobility secondary to hip pain. Pt tolerated sitting EOB x5 minutes, tolerates very little AROM or AAROM to RLE at this time. PT recommending SNF level of care post-acutely. PT to progress mobility as tolerated, and will continue to follow acutely.      Follow Up Recommendations SNF;Supervision/Assistance - 24 hour    Equipment Recommendations  None recommended by PT    Recommendations for Other Services       Precautions / Restrictions Precautions Precautions: Fall;Other (comment) Precaution Comments: Femur fx Restrictions Weight Bearing Restrictions: No RLE Weight Bearing: Weight bearing as tolerated      Mobility  Bed Mobility Overal bed mobility: Needs Assistance Bed Mobility: Rolling;Supine to Sit;Sit to Supine Rolling: Max assist   Supine to sit: Max assist;+2 for physical assistance;+2 for safety/equipment Sit to supine: Max assist;+2 for physical assistance;+2 for safety/equipment   General bed mobility comments: OT/PT assisted pt to EOB by pivoting upper body and lower body together. Pt resitive to movement, guarding for pain.    Transfers Overall  transfer level: Needs assistance Equipment used: 2 person hand held assist             General transfer comment: Pt unable to transfer at this time due to pain and resistance to stand. Pt unable to maintain midline when sitting EOB without support.  Ambulation/Gait                Stairs            Wheelchair Mobility    Modified Rankin (Stroke Patients Only)       Balance Overall balance assessment: Needs assistance Sitting-balance support: Bilateral upper extremity supported;Feet supported Sitting balance-Leahy Scale: Poor Sitting balance - Comments: Pt forcefully leaning backwards sitting EOB. Requiring max support with intermittent min support to maintain midline. Postural control: Posterior lean     Standing balance comment: Pt unable to stand at this time                             Pertinent Vitals/Pain Pain Assessment: Faces Faces Pain Scale: Hurts whole lot Pain Location: RLE Pain Descriptors / Indicators: Discomfort;Grimacing;Guarding;Moaning Pain Intervention(s): Limited activity within patient's tolerance;Monitored during session;Repositioned    Home Living Family/patient expects to be discharged to:: Assisted living               Home Equipment: Bedside commode;Tub bench;Grab bars - toilet;Grab bars - tub/shower;Hand held shower head;Wheelchair - manual      Prior Function Level of Independence: Needs assistance   Gait / Transfers Assistance Needed: Pt has been nonambulatory for 3+ years. She is able to stand and pivot to transfer to [facility], recliner, bsc, etc. ALF provides assistance with all functional  mobility and transfers.  ADL's / Homemaking Assistance Needed: Pt recieves assistance with all meals, cleaning, and medication managment from ALF/Family. Basic ADL's pt participates in, however per chart requires assistance for all bathing dressing, and set up for grooming.  Comments: history of dementia, information obtained  from combination of chart review and pt report     Hand Dominance   Dominant Hand: Right    Extremity/Trunk Assessment   Upper Extremity Assessment Upper Extremity Assessment: Generalized weakness    Lower Extremity Assessment Lower Extremity Assessment: Generalized weakness;RLE deficits/detail RLE: Unable to fully assess due to pain    Cervical / Trunk Assessment Cervical / Trunk Assessment: Normal  Communication   Communication: No difficulties  Cognition Arousal/Alertness: Awake/alert Behavior During Therapy: Anxious Overall Cognitive Status: History of cognitive impairments - at baseline                                 General Comments: Dementia at baseline, unable to provide much insight into prior levels of functioning or living situation. Pleasantly confused. Resistant against movement due to pain.      General Comments General comments (skin integrity, edema, etc.): VSS, BP WFL with MAP >=88 in supine and sitting    Exercises     Assessment/Plan    PT Assessment Patient needs continued PT services  PT Problem List Decreased strength;Decreased mobility;Decreased safety awareness;Decreased activity tolerance;Decreased balance;Decreased range of motion;Decreased knowledge of use of DME;Pain;Decreased cognition       PT Treatment Interventions DME instruction;Therapeutic activities;Gait training;Therapeutic exercise;Patient/family education;Balance training;Stair training;Functional mobility training;Neuromuscular re-education    PT Goals (Current goals can be found in the Care Plan section)  Acute Rehab PT Goals Patient Stated Goal: To stop hurting PT Goal Formulation: Patient unable to participate in goal setting Time For Goal Achievement: 04/15/21 Potential to Achieve Goals: Fair    Frequency Min 3X/week   Barriers to discharge        Co-evaluation PT/OT/SLP Co-Evaluation/Treatment: Yes Reason for Co-Treatment: For patient/therapist  safety;To address functional/ADL transfers;Necessary to address cognition/behavior during functional activity PT goals addressed during session: Mobility/safety with mobility;Balance;Strengthening/ROM OT goals addressed during session: ADL's and self-care;Proper use of Adaptive equipment and DME       AM-PAC PT "6 Clicks" Mobility  Outcome Measure Help needed turning from your back to your side while in a flat bed without using bedrails?: A Lot Help needed moving from lying on your back to sitting on the side of a flat bed without using bedrails?: Total Help needed moving to and from a bed to a chair (including a wheelchair)?: Total Help needed standing up from a chair using your arms (e.g., wheelchair or bedside chair)?: Total Help needed to walk in hospital room?: Total Help needed climbing 3-5 steps with a railing? : Total 6 Click Score: 7    End of Session   Activity Tolerance: Patient limited by pain;Other (comment) (anxiety) Patient left: in bed;with call bell/phone within reach;with bed alarm set;with SCD's reapplied Nurse Communication: Mobility status PT Visit Diagnosis: Other abnormalities of gait and mobility (R26.89);Difficulty in walking, not elsewhere classified (R26.2)    Time: 8416-6063 PT Time Calculation (min) (ACUTE ONLY): 21 min   Charges:   PT Evaluation $PT Eval Low Complexity: 1 Low          Ceciley Buist S, PT DPT Acute Rehabilitation Services Pager [phone]  Office [phone]  Roxine Caddy E Ruffin Pyo 04/01/2021, 3:28 PM

## 2021-04-01 NOTE — Progress Notes (Signed)
Orthopaedic Trauma Progress Note  SUBJECTIVE: Reports mild pain about operative site. Pain controlled with oral meds, has not needed IV pain medication post-op. No chest pain. No SOB. No nausea/vomiting. No other complaints.  Patient lives at home with her daughter.  States that she does not ambulate much at baseline but is able to stand and transfer.  OBJECTIVE:  Vitals:   04/01/21 0435 04/01/21 0800  BP: (!) 95/46 (!) 104/48  Pulse: 77 82  Resp: 16   Temp: 98.6 F (37 C) 98.6 F (37 C)  SpO2: 99% 97%    General: Sitting up in bed, no acute distress.  Pleasant and cooperative Respiratory: No increased work of breathing.  Right Lower Extremity: Dressing clean, dry, intact.  No significant tenderness with light palpation throughout the thigh or knee.  Tolerates ankle dorsiflexion and plantarflexion.  Endorses sensation to light touch over dorsal and plantar aspect of toes.  Able to wiggle toes.  Foot warm and well-perfused.  IMAGING: Stable post op imaging.   LABS:  Results for orders placed or performed during the hospital encounter of 03/29/21 (from the past 24 hour(s))  Glucose, capillary     Status: Abnormal   Collection Time: 03/31/21 11:27 AM  Result Value Ref Range   Glucose-Capillary 105 (H) 70 - 99 mg/dL   Comment 1 Notify RN   Glucose, capillary     Status: Abnormal   Collection Time: 03/31/21  2:04 PM  Result Value Ref Range   Glucose-Capillary 109 (H) 70 - 99 mg/dL  Glucose, capillary     Status: Abnormal   Collection Time: 03/31/21  6:06 PM  Result Value Ref Range   Glucose-Capillary 118 (H) 70 - 99 mg/dL  Glucose, capillary     Status: Abnormal   Collection Time: 03/31/21  8:18 PM  Result Value Ref Range   Glucose-Capillary 118 (H) 70 - 99 mg/dL  Glucose, capillary     Status: Abnormal   Collection Time: 04/01/21 12:05 AM  Result Value Ref Range   Glucose-Capillary 120 (H) 70 - 99 mg/dL  Glucose, capillary     Status: None   Collection Time: 04/01/21  4:31  AM  Result Value Ref Range   Glucose-Capillary 96 70 - 99 mg/dL  CBC     Status: Abnormal   Collection Time: 04/01/21  8:57 AM  Result Value Ref Range   WBC 16.0 (H) 4.0 - 10.5 K/uL   RBC 2.73 (L) 3.87 - 5.11 MIL/uL   Hemoglobin 8.6 (L) 12.0 - 15.0 g/dL   HCT 27.0 (L) 36.0 - 46.0 %   MCV 98.9 80.0 - 100.0 fL   MCH 31.5 26.0 - 34.0 pg   MCHC 31.9 30.0 - 36.0 g/dL   RDW 16.7 (H) 11.5 - 15.5 %   Platelets 282 150 - 400 K/uL   nRBC 0.0 0.0 - 0.2 %  Basic metabolic panel     Status: Abnormal   Collection Time: 04/01/21  9:03 AM  Result Value Ref Range   Sodium 137 135 - 145 mmol/L   Potassium 4.9 3.5 - 5.1 mmol/L   Chloride 104 98 - 111 mmol/L   CO2 25 22 - 32 mmol/L   Glucose, Bld 105 (H) 70 - 99 mg/dL   BUN 28 (H) 8 - 23 mg/dL   Creatinine, Ser 1.10 (H) 0.44 - 1.00 mg/dL   Calcium 7.6 (L) 8.9 - 10.3 mg/dL   GFR, Estimated 46 (L) >60 mL/min   Anion gap 8 5 - 15  ASSESSMENT: AVON MOLOCK is a 87 y.o. female, 1 Day Post-Op s/p ORIF DISTAL FEMUR FRACTURE  CV/Blood loss: Acute blood loss anemia, Hgb 8.6 this morning. Received 1 unit PRBCs pre-op  PLAN: Weightbearing: WBAT RLE  ROM: Ok for unrestricted ROM of knee Incisional and dressing care: Reinforce dressings as needed  Showering: Do not get RLE dressing wet Orthopedic device(s): None  Pain management:  1. Tylenol 650 mg q 4 hours PRN 2. Norco 5-325 mg q 6 hours PRN 3. Dilaudid 0.5 mg q 3 hours PRN VTE prophylaxis: Okay to restart Eliquis today from Ortho standpoint, SCDs ID: Ancef 2gm post op Foley/Lines:  No foley, KVO IVFs Impediments to Fracture Healing: Diabetes mellitus (well controlled, last A1c 5.8).  Vit D level pending, will start supplementation as indicated Dispo: PT/OT eval today, dispo pending. Continue to monitor CBC.  Plan to change RLE dressing tomorrow  Fllow - up plan: 2 weeks after discharge for repeat x-rays and wound check  Contact information:  Katha Hamming MD, Patrecia Pace PA-C. After hours  and holidays please check [email or web address] for group call information for [facility]   Daelon Dunivan A. Ricci Barker, PA-C [phone] (office) [email or web address]

## 2021-04-01 NOTE — Progress Notes (Signed)
PROGRESS NOTE    Casey Watts  MPN:[id number] DOB: September 21, 1924 DOA: 03/29/2021 PCP: Pcp, No    Brief Narrative:  Patient is a 87 year old female with history of anemia, chronic duodenal ulcer, chronic pain syndrome, constipation, diabetes type 2, GERD, hypertension, CKD who presented after a fall at her assisted living facility.  She was about to be transferred from 1 chair to another and sustained fall during that time.  X-rays showed right comminuted distal femur fracture.  Orthopedics consulted.  Family unsure consenting for surgery right away so family discussing with orthopedics about risks and benefits before proceeding for surgery..  4/27-plan for surgery today. S/p ! Unit PRBC overnight.   4/28- no overnight issues. Ortho cleared for eliquis to be resumed. Hg 8.6  Consultants:   orthopedics  Procedures:   Antimicrobials:       Subjective: Denies sob, cp, dizziness  Objective: Vitals:   04/01/21 0027 04/01/21 0400 04/01/21 0435 04/01/21 0800  BP: (!) 102/53  (!) 95/46 (!) 104/48  Pulse: 88  77 82  Resp: 15  16   Temp: 99 F (37.2 C)  98.6 F (37 C) 98.6 F (37 C)  TempSrc: Axillary  Oral Oral  SpO2: 100% 92% 99% 97%  Weight:      Height:        Intake/Output Summary (Last 24 hours) at 04/01/2021 0804 Last data filed at 04/01/2021 0300 Gross per 24 hour  Intake 1664.83 ml  Output --  Net 1664.83 ml   Filed Weights   03/29/21 2300 03/31/21 0906  Weight: 51.7 kg 51.7 kg    Examination: Calm, nad cta no w/r/r Reg s1/s2 no gallop Soft benign +bs No edema Awake , grossly intact Mood and affect appropriate in current setting    Data Reviewed: I have personally reviewed following labs and imaging studies  CBC: Recent Labs  Lab 03/29/21 1749 03/30/21 0131 03/31/21 0251  WBC 14.8* 12.0* 11.9*  NEUTROABS 12.7*  --  8.0*  HGB 9.9* 8.6* 7.1*  HCT 31.1* 26.7* 21.4*  MCV 101.3* 100.0 100.9*  PLT 269 223 008   Basic Metabolic Panel: Recent  Labs  Lab 03/29/21 1749 03/30/21 0131 03/31/21 0251  NA 137 137 136  K 4.3 4.0 3.9  CL 98 101 101  CO2 27 30 28   GLUCOSE 190* 122* 82  BUN 19 20 24*  CREATININE 1.14* 1.12* 1.10*  CALCIUM 8.0* 7.9* 7.8*   GFR: Estimated Creatinine Clearance: 23.1 mL/min (A) (by C-G formula based on SCr of 1.1 mg/dL (H)). Liver Function Tests: No results for input(s): AST, ALT, ALKPHOS, BILITOT, PROT, ALBUMIN in the last 168 hours. No results for input(s): LIPASE, AMYLASE in the last 168 hours. No results for input(s): AMMONIA in the last 168 hours. Coagulation Profile: No results for input(s): INR, PROTIME in the last 168 hours. Cardiac Enzymes: No results for input(s): CKTOTAL, CKMB, CKMBINDEX, TROPONINI in the last 168 hours. BNP (last 3 results) No results for input(s): PROBNP in the last 8760 hours. HbA1C: Recent Labs    03/30/21 0131  HGBA1C 5.8*   CBG: Recent Labs  Lab 03/31/21 1404 03/31/21 1806 03/31/21 2018 04/01/21 0005 04/01/21 0431  GLUCAP 109* 118* 118* 120* 96   Lipid Profile: No results for input(s): CHOL, HDL, LDLCALC, TRIG, CHOLHDL, LDLDIRECT in the last 72 hours. Thyroid Function Tests: No results for input(s): TSH, T4TOTAL, FREET4, T3FREE, THYROIDAB in the last 72 hours. Anemia Panel: Recent Labs    03/30/21 0131  VITAMINB12 553  FOLATE  35.8   Sepsis Labs: No results for input(s): PROCALCITON, LATICACIDVEN in the last 168 hours.  Recent Results (from the past 240 hour(s))  Resp Panel by RT-PCR (Flu A&B, Covid) Nasopharyngeal Swab     Status: None   Collection Time: 03/29/21  5:49 PM   Specimen: Nasopharyngeal Swab; Nasopharyngeal(NP) swabs in vial transport medium  Result Value Ref Range Status   SARS Coronavirus 2 by RT PCR NEGATIVE NEGATIVE Final    Comment: (NOTE) SARS-CoV-2 target nucleic acids are NOT DETECTED.  The SARS-CoV-2 RNA is generally detectable in upper respiratory specimens during the acute phase of infection. The  lowest concentration of SARS-CoV-2 viral copies this assay can detect is 138 copies/mL. A negative result does not preclude SARS-Cov-2 infection and should not be used as the sole basis for treatment or other patient management decisions. A negative result may occur with  improper specimen collection/handling, submission of specimen other than nasopharyngeal swab, presence of viral mutation(s) within the areas targeted by this assay, and inadequate number of viral copies(<138 copies/mL). A negative result must be combined with clinical observations, patient history, and epidemiological information. The expected result is Negative.  Fact Sheet for Patients:  EntrepreneurPulse.com.au  Fact Sheet for Healthcare Providers:  IncredibleEmployment.be  This test is no t yet approved or cleared by the [place] FDA and  has been authorized for detection and/or diagnosis of SARS-CoV-2 by FDA under an Emergency Use Authorization (EUA). This EUA will remain  in effect (meaning this test can be used) for the duration of the COVID-19 declaration under Section 564(b)(1) of the Act, 21 U.S.C.section 360bbb-3(b)(1), unless the authorization is terminated  or revoked sooner.       Influenza A by PCR NEGATIVE NEGATIVE Final   Influenza B by PCR NEGATIVE NEGATIVE Final    Comment: (NOTE) The Xpert Xpress SARS-CoV-2/FLU/RSV plus assay is intended as an aid in the diagnosis of influenza from Nasopharyngeal swab specimens and should not be used as a sole basis for treatment. Nasal washings and aspirates are unacceptable for Xpert Xpress SARS-CoV-2/FLU/RSV testing.  Fact Sheet for Patients: EntrepreneurPulse.com.au  Fact Sheet for Healthcare Providers: IncredibleEmployment.be  This test is not yet approved or cleared by the [place] FDA and has been authorized for detection and/or diagnosis of SARS-CoV-2 by FDA under  an Emergency Use Authorization (EUA). This EUA will remain in effect (meaning this test can be used) for the duration of the COVID-19 declaration under Section 564(b)(1) of the Act, 21 U.S.C. section 360bbb-3(b)(1), unless the authorization is terminated or revoked.  Performed at [facility], [place] [address] [place], [place] [address]   Surgical pcr screen     Status: None   Collection Time: 03/30/21 12:50 AM   Specimen: Nasal Mucosa; Nasal Swab  Result Value Ref Range Status   MRSA, PCR NEGATIVE NEGATIVE Final   Staphylococcus aureus NEGATIVE NEGATIVE Final    Comment: (NOTE) The Xpert SA Assay (FDA approved for NASAL specimens in patients 85 years of age and older), is one component of a comprehensive surveillance program. It is not intended to diagnose infection nor to guide or monitor treatment. Performed at [facility], [place] [address] [place], [place] [address]          Radiology Studies: DG C-Arm 1-60 Min  Result Date: 03/31/2021 CLINICAL DATA:  Intraoperative imaging.  ORIF. EXAM: RIGHT FEMUR 2 VIEWS; DG C-ARM 1-60 MIN COMPARISON:  Knee radiographs March 29, 2021. FINDINGS: Fluoro time 53 seconds. Reported radiation  3.31 mGy. Seven C-arm fluoroscopic images were obtained intraoperatively and submitted for post operative interpretation. These images demonstrate plate and screw fixation of a comminuted oblique fracture of the distal femoral diaphysis. Improved alignment with approximately quarter width shaft medial displacement of the distal fracture fragment. Please see the performing provider's procedural report for further detail. IMPRESSION: ORIF the femur with plate and screws. Electronically Signed   By: Margaretha Sheffield MD   On: 03/31/2021 11:40   DG FEMUR, MIN 2 VIEWS RIGHT  Result Date: 03/31/2021 CLINICAL DATA:  Status post surgical internal fixation of right distal femoral fracture. EXAM: RIGHT FEMUR 2 VIEWS COMPARISON:  March 29, 2021. FINDINGS:  Status post surgical internal fixation of comminuted distal right femoral shaft fracture. Improved alignment of fracture components is noted. IMPRESSION: Status post open reduction and surgical internal fixation of right distal femoral fracture. Electronically Signed   By: Marijo Conception M.D.   On: 03/31/2021 14:13   DG FEMUR, MIN 2 VIEWS RIGHT  Result Date: 03/31/2021 CLINICAL DATA:  Intraoperative imaging.  ORIF. EXAM: RIGHT FEMUR 2 VIEWS; DG C-ARM 1-60 MIN COMPARISON:  Knee radiographs March 29, 2021. FINDINGS: Fluoro time 53 seconds. Reported radiation 3.31 mGy. Seven C-arm fluoroscopic images were obtained intraoperatively and submitted for post operative interpretation. These images demonstrate plate and screw fixation of a comminuted oblique fracture of the distal femoral diaphysis. Improved alignment with approximately quarter width shaft medial displacement of the distal fracture fragment. Please see the performing provider's procedural report for further detail. IMPRESSION: ORIF the femur with plate and screws. Electronically Signed   By: Margaretha Sheffield MD   On: 03/31/2021 11:40        Scheduled Meds: . cholecalciferol  1,000 Units Oral Q0600  . docusate sodium  100 mg Oral BID  . insulin aspart  0-9 Units Subcutaneous TID WC  . levothyroxine  200 mcg Oral Q0600  . multivitamin with minerals  1 tablet Oral Q0600  . pantoprazole  20 mg Oral Daily   Continuous Infusions: . 0.9 % NaCl with KCl 20 mEq / L 100 mL/hr at 04/01/21 0135  .  ceFAZolin (ANCEF) IV 2 g (03/31/21 2227)  . lactated ringers 10 mL/hr at 03/31/21 3235    Assessment & Plan:   Principal Problem:   Closed fracture of right distal femur (HCC) Active Problems:   Chronic duodenal ulcer   GERD (gastroesophageal reflux disease)   Hypothyroidism   Hypertension   Renal insufficiency   Leukocytosis   Chronic pain   Diabetes mellitus type 2 in nonobese (HCC)   Chronic anemia   Iron deficiency  anemia   Closed fracture of right distal femur: Fell while being transferred from 1 chair to another.  X-ray showed comminuted distal femur fracture. 4/28 postop day 1 status post ORIF on 4/27 Orthopedics following, okay to resume Eliquis  PT OT eval today  Plan to change RLE dressing tomorrow  Follow-up 2 weeks after discharge for repeat x-ray to monitor    Leukocytosis: Increasing, afebrile  Possibly reactive  Will check UA  Hold off on antibiotics as no indication for infection as cause of leukocytosis    Recent history of DVT: Recently diagnosed and was started Eliquis 3 weeks ago.  4/28-cleared by ortho to resume eliquis    Chronic normocytic anemia/post op blood loss: Has history of iron deficiency anemia. 4/27-Hg 7.1 , s/p PRBC this am prior to going for surgery 4/28- Hg stable. Continue to monitor Stool occult pending  Hypertension:now hypotension Continue ivf @75ml  /hr Increase midodrine to 5mg  tid Hold bp meds  GERD/chronic duodenal ulcer:continue PPI  Diabetes type 2: On sliding scale insulin.  Monitor blood sugars  CKD stage II: Currently kidney function stable and at baseline.  Avoid  Nephrotoxins.  Dementia/debility: Advanced.  Confused at baseline.  She cannot ambulate and is chair bound.  Confused at baseline.  She lives in an ALF.    DVT prophylaxis: eliquis Code Status:DNR Family Communication: family at bedside  Status is: Inpatient  Remains inpatient appropriate because:Inpatient level of care appropriate due to severity of illness   Dispo: The patient is from: ALF              Anticipated d/c is to: SNF              Patient currently is not medically stable to d/c.   Difficult to place patient No            LOS: 3 days   Time spent: 35 min with >50% on coc     Nolberto Hanlon, MD [facility] Pager [phone]  If 7PM-7AM, please contact night-coverage 04/01/2021, 8:04 AM

## 2021-04-02 DIAGNOSIS — K267 Chronic duodenal ulcer without hemorrhage or perforation: Secondary | ICD-10-CM | POA: Diagnosis not present

## 2021-04-02 DIAGNOSIS — G8929 Other chronic pain: Secondary | ICD-10-CM | POA: Diagnosis not present

## 2021-04-02 DIAGNOSIS — D649 Anemia, unspecified: Secondary | ICD-10-CM | POA: Diagnosis not present

## 2021-04-02 DIAGNOSIS — S72401K Unspecified fracture of lower end of right femur, subsequent encounter for closed fracture with nonunion: Secondary | ICD-10-CM | POA: Diagnosis not present

## 2021-04-02 LAB — CBC
HCT: 27.4 % — ABNORMAL LOW (ref 36.0–46.0)
Hemoglobin: 8.7 g/dL — ABNORMAL LOW (ref 12.0–15.0)
MCH: 31.8 pg (ref 26.0–34.0)
MCHC: 31.8 g/dL (ref 30.0–36.0)
MCV: 100 fL (ref 80.0–100.0)
Platelets: 283 10*3/uL (ref 150–400)
RBC: 2.74 MIL/uL — ABNORMAL LOW (ref 3.87–5.11)
RDW: 16.3 % — ABNORMAL HIGH (ref 11.5–15.5)
WBC: 14.4 10*3/uL — ABNORMAL HIGH (ref 4.0–10.5)
nRBC: 0 % (ref 0.0–0.2)

## 2021-04-02 LAB — BASIC METABOLIC PANEL
Anion gap: 5 (ref 5–15)
BUN: 31 mg/dL — ABNORMAL HIGH (ref 8–23)
CO2: 27 mmol/L (ref 22–32)
Calcium: 7.8 mg/dL — ABNORMAL LOW (ref 8.9–10.3)
Chloride: 105 mmol/L (ref 98–111)
Creatinine, Ser: 1.11 mg/dL — ABNORMAL HIGH (ref 0.44–1.00)
GFR, Estimated: 46 mL/min — ABNORMAL LOW (ref >60)
Glucose, Bld: 86 mg/dL (ref 70–99)
Potassium: 4.8 mmol/L (ref 3.5–5.1)
Sodium: 137 mmol/L (ref 135–145)

## 2021-04-02 LAB — GLUCOSE, CAPILLARY
Glucose-Capillary: 109 mg/dL — ABNORMAL HIGH (ref 70–99)
Glucose-Capillary: 75 mg/dL (ref 70–99)
Glucose-Capillary: 77 mg/dL (ref 70–99)
Glucose-Capillary: 77 mg/dL (ref 70–99)
Glucose-Capillary: 86 mg/dL (ref 70–99)
Glucose-Capillary: 90 mg/dL (ref 70–99)

## 2021-04-02 LAB — VITAMIN D 25 HYDROXY (VIT D DEFICIENCY, FRACTURES): Vit D, 25-Hydroxy: 34.84 ng/mL (ref 30–100)

## 2021-04-02 MED ORDER — SODIUM CHLORIDE 0.9 % IV SOLN
1.0000 g | INTRAVENOUS | Status: DC
  Administered 2021-04-02 – 2021-04-05 (×4): 1 g via INTRAVENOUS
  Filled 2021-04-02 (×4): qty 10

## 2021-04-02 MED ORDER — LIDOCAINE 5 % EX PTCH
1.0000 | MEDICATED_PATCH | CUTANEOUS | Status: DC
  Administered 2021-04-02 – 2021-04-08 (×7): 1 via TRANSDERMAL
  Filled 2021-04-02 (×7): qty 1

## 2021-04-02 MED ORDER — HYDROMORPHONE HCL 1 MG/ML IJ SOLN
0.5000 mg | INTRAMUSCULAR | Status: DC | PRN
Start: 2021-04-02 — End: 2021-04-06
  Administered 2021-04-03 – 2021-04-05 (×2): 0.5 mg via INTRAVENOUS
  Filled 2021-04-02 (×2): qty 0.5

## 2021-04-02 MED ORDER — DEXTROSE-NACL 5-0.45 % IV SOLN: INTRAVENOUS | Status: DC

## 2021-04-02 MED ORDER — HYDROCODONE-ACETAMINOPHEN 5-325 MG PO TABS: 1.0000 | ORAL_TABLET | Freq: Four times a day (QID) | ORAL | 0 refills | Status: DC | PRN

## 2021-04-02 NOTE — Progress Notes (Signed)
PROGRESS NOTE    Casey Watts  IHK:[id number] DOB: May 23, 1924 DOA: 03/29/2021 PCP: Pcp, No    Brief Narrative:  Patient is a 87 year old female with history of anemia, chronic duodenal ulcer, chronic pain syndrome, constipation, diabetes type 2, GERD, hypertension, CKD who presented after a fall at her assisted living facility.  She was about to be transferred from 1 chair to another and sustained fall during that time.  X-rays showed right comminuted distal femur fracture.  Orthopedics consulted.  Family unsure consenting for surgery right away so family discussing with orthopedics about risks and benefits before proceeding for surgery..  4/27-plan for surgery today. S/p 1 Unit PRBC overnight.   4/29-hg 8.7. c/o rt hip pain  Consultants:   orthopedics  Procedures:   Antimicrobials:       Subjective: Denies sob, cp, dizziness  Objective: Vitals:   04/01/21 2152 04/02/21 0059 04/02/21 0556 04/02/21 0801  BP: (!) 95/40 (!) 107/45 (!) 101/41 (!) 98/51  Pulse: 77 88 80 87  Resp: 15 15 17 18   Temp: 99.1 F (37.3 C) 98.2 F (36.8 C) 98.2 F (36.8 C) (!) 97.5 F (36.4 C)  TempSrc: Oral Oral Oral Oral  SpO2: 93% 90% 94% (!) 89%  Weight:      Height:       No intake or output data in the 24 hours ending 04/02/21 1000 Filed Weights   03/29/21 2300 03/31/21 0906  Weight: 51.7 kg 51.7 kg    Examination: Eyes closed son at bedside yelling pain cta no w/r/r rrr s1/s2  Soft benign +bs No edema  Awake and aleryt x3    Data Reviewed: I have personally reviewed following labs and imaging studies  CBC: Recent Labs  Lab 03/29/21 1749 03/30/21 0131 03/31/21 0251 03/31/21 1035 04/01/21 0857 04/02/21 0012  WBC 14.8* 12.0* 11.9*  --  16.0* 14.4*  NEUTROABS 12.7*  --  8.0*  --   --   --   HGB 9.9* 8.6* 7.1* 8.2* 8.6* 8.7*  HCT 31.1* 26.7* 21.4* 24.0* 27.0* 27.4*  MCV 101.3* 100.0 100.9*  --  98.9 100.0  PLT 269 223 226  --  282 756   Basic Metabolic  Panel: Recent Labs  Lab 03/29/21 1749 03/30/21 0131 03/31/21 0251 03/31/21 1035 04/01/21 0903 04/02/21 0012  NA 137 137 136 138 137 137  K 4.3 4.0 3.9 3.9 4.9 4.8  CL 98 101 101 99 104 105  CO2 27 30 28   --  25 27  GLUCOSE 190* 122* 82 102* 105* 86  BUN 19 20 24* 24* 28* 31*  CREATININE 1.14* 1.12* 1.10* 1.00 1.10* 1.11*  CALCIUM 8.0* 7.9* 7.8*  --  7.6* 7.8*   GFR: Estimated Creatinine Clearance: 22.9 mL/min (A) (by C-G formula based on SCr of 1.11 mg/dL (H)). Liver Function Tests: No results for input(s): AST, ALT, ALKPHOS, BILITOT, PROT, ALBUMIN in the last 168 hours. No results for input(s): LIPASE, AMYLASE in the last 168 hours. No results for input(s): AMMONIA in the last 168 hours. Coagulation Profile: No results for input(s): INR, PROTIME in the last 168 hours. Cardiac Enzymes: No results for input(s): CKTOTAL, CKMB, CKMBINDEX, TROPONINI in the last 168 hours. BNP (last 3 results) No results for input(s): PROBNP in the last 8760 hours. HbA1C: No results for input(s): HGBA1C in the last 72 hours. CBG: Recent Labs  Lab 04/01/21 1624 04/01/21 2030 04/02/21 0015 04/02/21 0441 04/02/21 0811  GLUCAP 111* 106* 90 77 75   Lipid Profile: No  results for input(s): CHOL, HDL, LDLCALC, TRIG, CHOLHDL, LDLDIRECT in the last 72 hours. Thyroid Function Tests: No results for input(s): TSH, T4TOTAL, FREET4, T3FREE, THYROIDAB in the last 72 hours. Anemia Panel: No results for input(s): VITAMINB12, FOLATE, FERRITIN, TIBC, IRON, RETICCTPCT in the last 72 hours. Sepsis Labs: No results for input(s): PROCALCITON, LATICACIDVEN in the last 168 hours.  Recent Results (from the past 240 hour(s))  Resp Panel by RT-PCR (Flu A&B, Covid) Nasopharyngeal Swab     Status: None   Collection Time: 03/29/21  5:49 PM   Specimen: Nasopharyngeal Swab; Nasopharyngeal(NP) swabs in vial transport medium  Result Value Ref Range Status   SARS Coronavirus 2 by RT PCR NEGATIVE NEGATIVE Final     Comment: (NOTE) SARS-CoV-2 target nucleic acids are NOT DETECTED.  The SARS-CoV-2 RNA is generally detectable in upper respiratory specimens during the acute phase of infection. The lowest concentration of SARS-CoV-2 viral copies this assay can detect is 138 copies/mL. A negative result does not preclude SARS-Cov-2 infection and should not be used as the sole basis for treatment or other patient management decisions. A negative result may occur with  improper specimen collection/handling, submission of specimen other than nasopharyngeal swab, presence of viral mutation(s) within the areas targeted by this assay, and inadequate number of viral copies(<138 copies/mL). A negative result must be combined with clinical observations, patient history, and epidemiological information. The expected result is Negative.  Fact Sheet for Patients:  EntrepreneurPulse.com.au  Fact Sheet for Healthcare Providers:  IncredibleEmployment.be  This test is no t yet approved or cleared by the [place] FDA and  has been authorized for detection and/or diagnosis of SARS-CoV-2 by FDA under an Emergency Use Authorization (EUA). This EUA will remain  in effect (meaning this test can be used) for the duration of the COVID-19 declaration under Section 564(b)(1) of the Act, 21 U.S.C.section 360bbb-3(b)(1), unless the authorization is terminated  or revoked sooner.       Influenza A by PCR NEGATIVE NEGATIVE Final   Influenza B by PCR NEGATIVE NEGATIVE Final    Comment: (NOTE) The Xpert Xpress SARS-CoV-2/FLU/RSV plus assay is intended as an aid in the diagnosis of influenza from Nasopharyngeal swab specimens and should not be used as a sole basis for treatment. Nasal washings and aspirates are unacceptable for Xpert Xpress SARS-CoV-2/FLU/RSV testing.  Fact Sheet for Patients: EntrepreneurPulse.com.au  Fact Sheet for Healthcare  Providers: IncredibleEmployment.be  This test is not yet approved or cleared by the [place] FDA and has been authorized for detection and/or diagnosis of SARS-CoV-2 by FDA under an Emergency Use Authorization (EUA). This EUA will remain in effect (meaning this test can be used) for the duration of the COVID-19 declaration under Section 564(b)(1) of the Act, 21 U.S.C. section 360bbb-3(b)(1), unless the authorization is terminated or revoked.  Performed at [facility], [place] [address], [place], [place] [address]   Surgical pcr screen     Status: None   Collection Time: 03/30/21 12:50 AM   Specimen: Nasal Mucosa; Nasal Swab  Result Value Ref Range Status   MRSA, PCR NEGATIVE NEGATIVE Final   Staphylococcus aureus NEGATIVE NEGATIVE Final    Comment: (NOTE) The Xpert SA Assay (FDA approved for NASAL specimens in patients 21 years of age and older), is one component of a comprehensive surveillance program. It is not intended to diagnose infection nor to guide or monitor treatment. Performed at [facility], [place] [address], [place], [place] [address]  Radiology Studies: DG C-Arm 1-60 Min  Result Date: 03/31/2021 CLINICAL DATA:  Intraoperative imaging.  ORIF. EXAM: RIGHT FEMUR 2 VIEWS; DG C-ARM 1-60 MIN COMPARISON:  Knee radiographs March 29, 2021. FINDINGS: Fluoro time 53 seconds. Reported radiation 3.31 mGy. Seven C-arm fluoroscopic images were obtained intraoperatively and submitted for post operative interpretation. These images demonstrate plate and screw fixation of a comminuted oblique fracture of the distal femoral diaphysis. Improved alignment with approximately quarter width shaft medial displacement of the distal fracture fragment. Please see the performing provider's procedural report for further detail. IMPRESSION: ORIF the femur with plate and screws. Electronically Signed   By: Margaretha Sheffield MD   On: 03/31/2021 11:40   DG  FEMUR, MIN 2 VIEWS RIGHT  Result Date: 03/31/2021 CLINICAL DATA:  Status post surgical internal fixation of right distal femoral fracture. EXAM: RIGHT FEMUR 2 VIEWS COMPARISON:  March 29, 2021. FINDINGS: Status post surgical internal fixation of comminuted distal right femoral shaft fracture. Improved alignment of fracture components is noted. IMPRESSION: Status post open reduction and surgical internal fixation of right distal femoral fracture. Electronically Signed   By: Marijo Conception M.D.   On: 03/31/2021 14:13   DG FEMUR, MIN 2 VIEWS RIGHT  Result Date: 03/31/2021 CLINICAL DATA:  Intraoperative imaging.  ORIF. EXAM: RIGHT FEMUR 2 VIEWS; DG C-ARM 1-60 MIN COMPARISON:  Knee radiographs March 29, 2021. FINDINGS: Fluoro time 53 seconds. Reported radiation 3.31 mGy. Seven C-arm fluoroscopic images were obtained intraoperatively and submitted for post operative interpretation. These images demonstrate plate and screw fixation of a comminuted oblique fracture of the distal femoral diaphysis. Improved alignment with approximately quarter width shaft medial displacement of the distal fracture fragment. Please see the performing provider's procedural report for further detail. IMPRESSION: ORIF the femur with plate and screws. Electronically Signed   By: Margaretha Sheffield MD   On: 03/31/2021 11:40        Scheduled Meds: . apixaban  5 mg Oral BID  . cholecalciferol  1,000 Units Oral Q0600  . docusate sodium  100 mg Oral BID  . insulin aspart  0-9 Units Subcutaneous TID WC  . levothyroxine  200 mcg Oral Q0600  . midodrine  5 mg Oral TID WC  . multivitamin with minerals  1 tablet Oral Q0600  . pantoprazole  20 mg Oral Daily   Continuous Infusions: . 0.9 % NaCl with KCl 20 mEq / L 75 mL/hr at 04/02/21 0602    Assessment & Plan:   Principal Problem:   Closed fracture of right distal femur (HCC) Active Problems:   Chronic duodenal ulcer   GERD (gastroesophageal reflux disease)    Hypothyroidism   Hypertension   Renal insufficiency   Leukocytosis   Chronic pain   Diabetes mellitus type 2 in nonobese (HCC)   Chronic anemia   Iron deficiency anemia   Closed fracture of right distal femur: Fell while being transferred from 1 chair to another.  X-ray showed comminuted distal femur fracture. 4/28 postop day 1 status post ORIF on 4/27 Orthopedics following, okay to resume Eliquis  PT OT eval today  Plan to change RLE dressing tomorrow  Follow-up 2 weeks after discharge for repeat x-ray to monitor    Leukocytosis UA positive UCSx GM-  Wbc trending down Will start rocephin   Recent history of DVT: Recently diagnosed and was started Eliquis 3 weeks ago.  4/29-on eliquis    Chronic normocytic anemia/post op blood loss: Has history of iron deficiency anemia. 4/27-Hg 7.1 ,  s/p PRBC this am prior to going for surgery 4/29-Hg stable. Continue to monitor   Hypertension:now hypotension Continue ivf @75ml  /hr Increase midodrine to 5mg  tid Hold bp meds  GERD/chronic duodenal ulcer:continue PPI  Diabetes type 2: On sliding scale insulin.  Monitor blood sugars  CKD stage II: Currently kidney function stable and at baseline.  Avoid  Nephrotoxins.  Dementia/debility: Advanced.  Confused at baseline.  She cannot ambulate and is chair bound.  Confused at baseline.  She lives in an ALF.    DVT prophylaxis: eliquis Code Status:DNR Family Communication: family at bedside  Status is: Inpatient  Remains inpatient appropriate because:Inpatient level of care appropriate due to severity of illness   Dispo: The patient is from: ALF              Anticipated d/c is to: SNF              Patient currently is not medically stable to d/c.   Difficult to place patient No            LOS: 4 days   Time spent: 35 min with >50% on coc     Nolberto Hanlon, MD [facility] Pager [phone]  If 7PM-7AM, please contact night-coverage 04/02/2021,  10:00 AM

## 2021-04-02 NOTE — NC FL2 (Addendum)
[place] LEVEL OF CARE SCREENING TOOL     IDENTIFICATION  Patient Name: Casey Watts Birthdate: 10-01-1924 Sex: female Admission Date (Current Location): 03/29/2021  [facility] and [place] Number:  Herbalist and Address:  [facility] [place], [place] [address], [place], [place] [address]      Provider Number: [id number]  Attending Physician Name and Address:  Nolberto Hanlon, MD  Relative Name and Phone Number:       Current Level of Care: Hospital Recommended Level of Care: [place] Prior Approval Number:    Date Approved/Denied:   PASRR Number: [id number]  Discharge Plan: SNF    Current Diagnoses: Patient Active Problem List   Diagnosis Date Noted  . Closed fracture of right distal femur ([place]) 03/29/2021  . Acute respiratory failure with hypoxia ([place]) 04/01/2018  . Chronic anemia 04/01/2018  . Iron deficiency anemia 04/01/2018  . Pressure injury of skin 03/16/2018  . Lactic acidosis 03/16/2018  . Diabetes mellitus type 2 in nonobese ([place]) 03/16/2018  . Cellulitis of both lower extremities 03/15/2018  . Chest pain 05/02/2017  . Diabetes mellitus with complication ([place]) 28/31/5176  . Renal insufficiency 05/02/2017  . Leukocytosis 05/02/2017  . Chronic pain 05/02/2017  . Atypical chest pain   . GERD (gastroesophageal reflux disease) 02/13/2014  . Constipation 02/13/2014  . Hypothyroidism 02/13/2014  . Hypertension 02/13/2014  . Proximal humeral fracture 02/04/2014  . Stricture and stenosis of esophagus 02/28/2011  . Chronic duodenal ulcer 02/28/2011  . DM 01/19/2011    Orientation RESPIRATION BLADDER Height & Weight     Self,Place  Normal External catheter Weight: 51.7 kg Height:  5\' 1"  (154.9 cm)  BEHAVIORAL SYMPTOMS/MOOD NEUROLOGICAL BOWEL NUTRITION STATUS      Continent Diet (refer to d/c summary)  AMBULATORY STATUS COMMUNICATION OF NEEDS Skin     Verbally Surgical wounds (s/p Open reduction  internal fixation of right supracondylar femur fracture,4/27)                       Personal Care Assistance Level of Assistance              Functional Limitations Info  Sight,Speech,Hearing Sight Info: Adequate Hearing Info: Adequate Speech Info: Adequate    SPECIAL CARE FACTORS FREQUENCY  PT (By licensed PT),OT (By licensed OT)     PT Frequency: 5x/week, evaluate and treat OT Frequency: 5x/week, evaluate and treat            Contractures Contractures Info: Not present    Additional Factors Info  Code Status,Allergies Code Status Info: DNR Allergies Info: No Known Allergies           Current Medications (04/02/2021):  This is the current hospital active medication list Current Facility-Administered Medications  Medication Dose Route Frequency Provider Last Rate Last Admin  . acetaminophen (TYLENOL) tablet 650 mg  650 mg Oral Q4H PRN Delray Alt, PA-C   650 mg at 03/30/21 2228  . apixaban (ELIQUIS) tablet 5 mg  5 mg Oral BID Donnamae Jude, RPH   5 mg at 04/02/21 1006  . cefTRIAXone (ROCEPHIN) 1 g in sodium chloride 0.9 % 100 mL IVPB  1 g Intravenous Q24H Nolberto Hanlon, MD      . cholecalciferol (VITAMIN D3) tablet 1,000 Units  1,000 Units Oral Q0600 Delray Alt, PA-C   1,000 Units at 04/01/21 0800  . dextrose 5 %-0.45 % sodium chloride infusion   Intravenous Continuous Amery,  Sahar, MD 75 mL/hr at 04/02/21 1442 New Bag at 04/02/21 1442  . docusate sodium (COLACE) capsule 100 mg  100 mg Oral BID Delray Alt, PA-C   100 mg at 04/01/21 2154  . HYDROcodone-acetaminophen (NORCO/VICODIN) 5-325 MG per tablet 1-2 tablet  1-2 tablet Oral Q6H PRN Delray Alt, PA-C   1 tablet at 04/02/21 1006  . HYDROmorphone (DILAUDID) injection 0.5 mg  0.5 mg Intravenous Q4H PRN Nolberto Hanlon, MD      . insulin aspart (novoLOG) injection 0-9 Units  0-9 Units Subcutaneous TID WC Patrecia Pace A, PA-C   1 Units at 04/01/21 1148  . levothyroxine (SYNTHROID) tablet 200 mcg  200  mcg Oral Q0600 Delray Alt, PA-C   200 mcg at 04/02/21 0601  . lidocaine (LIDODERM) 5 % 1 patch  1 patch Transdermal Q24H Nolberto Hanlon, MD   1 patch at 04/02/21 1106  . metoCLOPramide (REGLAN) tablet 5-10 mg  5-10 mg Oral Q8H PRN Patrecia Pace A, PA-C       Or  . metoCLOPramide (REGLAN) injection 5-10 mg  5-10 mg Intravenous Q8H PRN Patrecia Pace A, PA-C      . midodrine (PROAMATINE) tablet 5 mg  5 mg Oral TID WC Nolberto Hanlon, MD   5 mg at 04/02/21 1442  . multivitamin with minerals tablet 1 tablet  1 tablet Oral Q0600 Delray Alt, PA-C   1 tablet at 04/01/21 0801  . ondansetron (ZOFRAN) tablet 4 mg  4 mg Oral Q6H PRN Patrecia Pace A, PA-C       Or  . ondansetron [facility]) injection 4 mg  4 mg Intravenous Q6H PRN Delray Alt, PA-C      . pantoprazole (PROTONIX) EC tablet 20 mg  20 mg Oral Daily Patrecia Pace A, PA-C   20 mg at 04/01/21 0801  . polyethylene glycol (MIRALAX / GLYCOLAX) packet 17 g  17 g Oral Daily PRN Delray Alt, PA-C         Discharge Medications: Please see discharge summary for a list of discharge medications.  Relevant Imaging Results:  Relevant Lab Results:   Additional Information SS#: [phone], [place]

## 2021-04-02 NOTE — Discharge Instructions (Signed)
Orthopaedic Trauma Service Discharge Instructions   General Discharge Instructions  WEIGHT BEARING STATUS: Weightbearing as tolerated right lower extremity  RANGE OF MOTION/ACTIVITY: Okay for unrestricted range of motion of hip and the knee  Wound Care: Incisions can be left open to air if there is no drainage. If incision continues to have drainage, follow wound care instructions below. Okay to shower if no drainage from incisions.  DVT/PE prophylaxis: Eliquis  Diet: as you were eating previously.  Can use over the counter stool softeners and bowel preparations, such as Miralax, to help with bowel movements.  Narcotics can be constipating.  Be sure to drink plenty of fluids  PAIN MEDICATION USE AND EXPECTATIONS  You have likely been given narcotic medications to help control your pain.  After a traumatic event that results in an fracture (broken bone) with or without surgery, it is ok to use narcotic pain medications to help control one's pain.  We understand that everyone responds to pain differently and each individual patient will be evaluated on a regular basis for the continued need for narcotic medications. Ideally, narcotic medication use should last no more than 6-8 weeks (coinciding with fracture healing).   As a patient it is your responsibility as well to monitor narcotic medication use and report the amount and frequency you use these medications when you come to your office visit.   We would also advise that if you are using narcotic medications, you should take a dose prior to therapy to maximize you participation.  IF YOU ARE ON NARCOTIC MEDICATIONS IT IS NOT PERMISSIBLE TO OPERATE A MOTOR VEHICLE (MOTORCYCLE/CAR/TRUCK/MOPED) OR HEAVY MACHINERY DO NOT MIX NARCOTICS WITH OTHER CNS (CENTRAL NERVOUS SYSTEM) DEPRESSANTS SUCH AS ALCOHOL   STOP SMOKING OR USING NICOTINE PRODUCTS!!!!  As discussed nicotine severely impairs your body's ability to heal surgical and traumatic  wounds but also impairs bone healing.  Wounds and bone heal by forming microscopic blood vessels (angiogenesis) and nicotine is a vasoconstrictor (essentially, shrinks blood vessels).  Therefore, if vasoconstriction occurs to these microscopic blood vessels they essentially disappear and are unable to deliver necessary nutrients to the healing tissue.  This is one modifiable factor that you can do to dramatically increase your chances of healing your injury.    (This means no smoking, no nicotine gum, patches, etc)  DO NOT USE NONSTEROIDAL ANTI-INFLAMMATORY DRUGS (NSAID'S)  Using products such as Advil (ibuprofen), Aleve (naproxen), Motrin (ibuprofen) for additional pain control during fracture healing can delay and/or prevent the healing response.  If you would like to take over the counter (OTC) medication, Tylenol (acetaminophen) is ok.  However, some narcotic medications that are given for pain control contain acetaminophen as well. Therefore, you should not exceed more than 4000 mg of tylenol in a day if you do not have liver disease.  Also note that there are may OTC medicines, such as cold medicines and allergy medicines that my contain tylenol as well.  If you have any questions about medications and/or interactions please ask your doctor/PA or your pharmacist.      ICE AND ELEVATE INJURED/OPERATIVE EXTREMITY  Using ice and elevating the injured extremity above your heart can help with swelling and pain control.  Icing in a pulsatile fashion, such as 20 minutes on and 20 minutes off, can be followed.    Do not place ice directly on skin. Make sure there is a barrier between to skin and the ice pack.    Using frozen items such as  frozen peas works well as the conform nicely to the are that needs to be iced.  USE AN ACE WRAP OR TED HOSE FOR SWELLING CONTROL  In addition to icing and elevation, Ace wraps or TED hose are used to help limit and resolve swelling.  It is recommended to use Ace wraps or  TED hose until you are informed to stop.    When using Ace Wraps start the wrapping distally (farthest away from the body) and wrap proximally (closer to the body)   Example: If you had surgery on your leg or thing and you do not have a splint on, start the ace wrap at the toes and work your way up to the thigh        If you had surgery on your upper extremity and do not have a splint on, start the ace wrap at your fingers and work your way up to the upper arm   [place]: [phone]   VISIT OUR WEBSITE FOR ADDITIONAL INFORMATION: [email or web address]   Discharge Wound Care Instructions  Do NOT apply any ointments, solutions or lotions to pin sites or surgical wounds.  These prevent needed drainage and even though solutions like hydrogen peroxide kill bacteria, they also damage cells lining the pin sites that help fight infection.  Applying lotions or ointments can keep the wounds moist and can cause them to breakdown and open up as well. This can increase the risk for infection. When in doubt call the office.  If any drainage is noted, use one layer of adaptic, then gauze, Kerlix, and an ace wrap.  Once the incision is completely dry and without drainage, it may be left open to air out.  Showering may begin 36-48 hours later.  Cleaning gently with soap and water.

## 2021-04-02 NOTE — Progress Notes (Signed)
Orthopaedic Trauma Progress Note  SUBJECTIVE: Reports minimal to mild pain about operative site. Pain controlled with oral meds, has not needed IV pain medication post-op. No chest pain. No SOB. No nausea/vomiting. No other complaints this morning.  OBJECTIVE:  Vitals:   04/02/21 0556 04/02/21 0801  BP: (!) 101/41 (!) 98/51  Pulse: 80 87  Resp: 17 18  Temp: 98.2 F (36.8 C) (!) 97.5 F (36.4 C)  SpO2: 94% (!) 89%    General: Sitting up in bed, intermittently falling asleep.  No acute distress.  Pleasant and cooperative Respiratory: No increased work of breathing.  Right Lower Extremity: Dressing changed, incisions clean, dry, intact.  No significant tenderness with light palpation throughout the thigh or knee.  Tolerates ankle dorsiflexion and plantarflexion.  Endorses sensation to light touch over dorsal and plantar aspect of toes.  Pressure sore on great toe stable.  Able to wiggle toes.  Foot warm and well-perfused. + DP pulse  IMAGING: Stable post op imaging.   LABS:  Results for orders placed or performed during the hospital encounter of 03/29/21 (from the past 24 hour(s))  Glucose, capillary     Status: Abnormal   Collection Time: 04/01/21 11:36 AM  Result Value Ref Range   Glucose-Capillary 136 (H) 70 - 99 mg/dL  Urinalysis, Routine w reflex microscopic Urine, Catheterized     Status: Abnormal   Collection Time: 04/01/21 11:49 AM  Result Value Ref Range   Color, Urine YELLOW (A) YELLOW   APPearance TURBID (A) CLEAR   Specific Gravity, Urine 1.025 1.005 - 1.030   pH 5.0 5.0 - 8.0   Glucose, UA NEGATIVE NEGATIVE mg/dL   Hgb urine dipstick SMALL (A) NEGATIVE   Bilirubin Urine NEGATIVE NEGATIVE   Ketones, ur 5 (A) NEGATIVE mg/dL   Protein, ur 100 (A) NEGATIVE mg/dL   Nitrite NEGATIVE NEGATIVE   Leukocytes,Ua MODERATE (A) NEGATIVE   RBC / HPF 11-20 0 - 5 RBC/hpf   WBC, UA >50 (H) 0 - 5 WBC/hpf   Bacteria, UA MANY (A) NONE SEEN   Squamous Epithelial / LPF >50 (H) 0 - 5    WBC Clumps PRESENT    Non Squamous Epithelial 11-20 (A) NONE SEEN  Glucose, capillary     Status: Abnormal   Collection Time: 04/01/21  4:24 PM  Result Value Ref Range   Glucose-Capillary 111 (H) 70 - 99 mg/dL  Glucose, capillary     Status: Abnormal   Collection Time: 04/01/21  8:30 PM  Result Value Ref Range   Glucose-Capillary 106 (H) 70 - 99 mg/dL  Basic metabolic panel     Status: Abnormal   Collection Time: 04/02/21 12:12 AM  Result Value Ref Range   Sodium 137 135 - 145 mmol/L   Potassium 4.8 3.5 - 5.1 mmol/L   Chloride 105 98 - 111 mmol/L   CO2 27 22 - 32 mmol/L   Glucose, Bld 86 70 - 99 mg/dL   BUN 31 (H) 8 - 23 mg/dL   Creatinine, Ser 1.11 (H) 0.44 - 1.00 mg/dL   Calcium 7.8 (L) 8.9 - 10.3 mg/dL   GFR, Estimated 46 (L) >60 mL/min   Anion gap 5 5 - 15  CBC     Status: Abnormal   Collection Time: 04/02/21 12:12 AM  Result Value Ref Range   WBC 14.4 (H) 4.0 - 10.5 K/uL   RBC 2.74 (L) 3.87 - 5.11 MIL/uL   Hemoglobin 8.7 (L) 12.0 - 15.0 g/dL   HCT 27.4 (L)  36.0 - 46.0 %   MCV 100.0 80.0 - 100.0 fL   MCH 31.8 26.0 - 34.0 pg   MCHC 31.8 30.0 - 36.0 g/dL   RDW 16.3 (H) 11.5 - 15.5 %   Platelets 283 150 - 400 K/uL   nRBC 0.0 0.0 - 0.2 %  VITAMIN D 25 Hydroxy (Vit-D Deficiency, Fractures)     Status: None   Collection Time: 04/02/21 12:12 AM  Result Value Ref Range   Vit D, 25-Hydroxy 34.84 30 - 100 ng/mL  Glucose, capillary     Status: None   Collection Time: 04/02/21 12:15 AM  Result Value Ref Range   Glucose-Capillary 90 70 - 99 mg/dL  Glucose, capillary     Status: None   Collection Time: 04/02/21  4:41 AM  Result Value Ref Range   Glucose-Capillary 77 70 - 99 mg/dL  Glucose, capillary     Status: None   Collection Time: 04/02/21  8:11 AM  Result Value Ref Range   Glucose-Capillary 75 70 - 99 mg/dL    ASSESSMENT: Casey Watts is a 87 y.o. female, 2 Days Post-Op s/p ORIF DISTAL FEMUR FRACTURE  CV/Blood loss: Hemoglobin 8.7 this morning, stable.   Received 1 unit PRBCs pre-op  PLAN: Weightbearing: WBAT RLE  ROM: Ok for unrestricted ROM of knee Incisional and dressing care: Incisions may be left open to air.  Change Mepilex as needed Showering: Okay to begin showering at incisions.  Incisions may get wet. Orthopedic device(s): None  Pain management:  1. Tylenol 650 mg q 4 hours PRN 2. Norco 5-325 mg q 6 hours PRN 3. Dilaudid 0.5 mg q 3 hours PRN VTE prophylaxis: Eliquis, SCDs ID: Ancef 2gm post op Foley/Lines:  No foley, KVO IVFs Impediments to Fracture Healing: Diabetes mellitus (well controlled, last A1c 5.8).  Vit D level 34, no need for supplementation  Dispo: Therapies as tolerated, PT/OT recommending SNF.  Patient okay for discharge from ortho standpoint once cleared by therapies and medicine team.  I have signed and placed discharge Rx for pain medication in patient's chart Fllow - up plan: 2 weeks after discharge for repeat x-rays and wound check  Contact information:  Katha Hamming MD, Patrecia Pace PA-C. After hours and holidays please check [email or web address] for group call information for [facility]   Casey Mcclafferty A. Ricci Barker, PA-C [phone] (office) [email or web address]

## 2021-04-03 DIAGNOSIS — G8929 Other chronic pain: Secondary | ICD-10-CM | POA: Diagnosis not present

## 2021-04-03 DIAGNOSIS — K267 Chronic duodenal ulcer without hemorrhage or perforation: Secondary | ICD-10-CM | POA: Diagnosis not present

## 2021-04-03 DIAGNOSIS — D649 Anemia, unspecified: Secondary | ICD-10-CM | POA: Diagnosis not present

## 2021-04-03 DIAGNOSIS — S72401K Unspecified fracture of lower end of right femur, subsequent encounter for closed fracture with nonunion: Secondary | ICD-10-CM | POA: Diagnosis not present

## 2021-04-03 LAB — GLUCOSE, CAPILLARY
Glucose-Capillary: 126 mg/dL — ABNORMAL HIGH (ref 70–99)
Glucose-Capillary: 133 mg/dL — ABNORMAL HIGH (ref 70–99)
Glucose-Capillary: 140 mg/dL — ABNORMAL HIGH (ref 70–99)
Glucose-Capillary: 157 mg/dL — ABNORMAL HIGH (ref 70–99)
Glucose-Capillary: 158 mg/dL — ABNORMAL HIGH (ref 70–99)
Glucose-Capillary: 163 mg/dL — ABNORMAL HIGH (ref 70–99)

## 2021-04-03 LAB — URINE CULTURE: Culture: >=100000 — AB

## 2021-04-03 LAB — BASIC METABOLIC PANEL
Anion gap: 7 (ref 5–15)
BUN: 30 mg/dL — ABNORMAL HIGH (ref 8–23)
CO2: 23 mmol/L (ref 22–32)
Calcium: 7.9 mg/dL — ABNORMAL LOW (ref 8.9–10.3)
Chloride: 106 mmol/L (ref 98–111)
Creatinine, Ser: 1 mg/dL (ref 0.44–1.00)
GFR, Estimated: 52 mL/min — ABNORMAL LOW (ref >60)
Glucose, Bld: 145 mg/dL — ABNORMAL HIGH (ref 70–99)
Potassium: 4.8 mmol/L (ref 3.5–5.1)
Sodium: 136 mmol/L (ref 135–145)

## 2021-04-03 NOTE — Plan of Care (Signed)
  Problem: Pain Managment: Goal: General experience of comfort will improve 04/03/2021 0807 by Mayme Genta, RN Outcome: Progressing 04/03/2021 0806 by Mayme Genta, RN Outcome: Progressing   Problem: Safety: Goal: Ability to remain free from injury will improve 04/03/2021 0807 by Mayme Genta, RN Outcome: Progressing 04/03/2021 0806 by Mayme Genta, RN Outcome: Progressing

## 2021-04-03 NOTE — Plan of Care (Signed)
  Problem: Safety: Goal: Ability to remain free from injury will improve Outcome: Progressing   

## 2021-04-03 NOTE — Progress Notes (Signed)
PROGRESS NOTE    Casey Watts  OAC:[id number] DOB: 24-Jun-1924 DOA: 03/29/2021 PCP: Pcp, No    Brief Narrative:  Patient is a 87 year old female with history of anemia, chronic duodenal ulcer, chronic pain syndrome, constipation, diabetes type 2, GERD, hypertension, CKD who presented after a fall at her assisted living facility.  She was about to be transferred from 1 chair to another and sustained fall during that time.  X-rays showed right comminuted distal femur fracture.  Orthopedics consulted.  Family unsure consenting for surgery right away so family discussing with orthopedics about risks and benefits before proceeding for surgery..  4/27-plan for surgery today. S/p 1 Unit PRBC overnight.   4/30- pt was given 2 pain vicodin, when I saw pt pt was in deep sleep. SLP evaluated pt and placed on dysphagia 2 diet with thin liquids.  Consultants:   orthopedics  Procedures:   Antimicrobials:       Subjective: Unable to ask as she is sleeping  Objective: Vitals:   04/02/21 0801 04/02/21 2241 04/03/21 0414 04/03/21 0800  BP: (!) 98/51 (!) 103/53 (!) 108/51 121/62  Pulse: 87 88 65 68  Resp: 18 19 16 20   Temp: (!) 97.5 F (36.4 C) 98.2 F (36.8 C) (!) 97.4 F (36.3 C) 97.6 F (36.4 C)  TempSrc: Oral Axillary Oral Oral  SpO2: (!) 89% 92% 91% 92%  Weight:      Height:        Intake/Output Summary (Last 24 hours) at 04/03/2021 1320 Last data filed at 04/03/2021 0300 Gross per 24 hour  Intake 1021.85 ml  Output 300 ml  Net 721.85 ml   Filed Weights   03/29/21 2300 03/31/21 0906  Weight: 51.7 kg 51.7 kg    Examination: Sleepying, does not open eyes or reposnd cta anteriorly no w/r/r Regular s1/s2 no gallop Soft benign +bs No edema    Data Reviewed: I have personally reviewed following labs and imaging studies  CBC: Recent Labs  Lab 03/29/21 1749 03/30/21 0131 03/31/21 0251 03/31/21 1035 04/01/21 0857 04/02/21 0012  WBC 14.8* 12.0* 11.9*  --  16.0*  14.4*  NEUTROABS 12.7*  --  8.0*  --   --   --   HGB 9.9* 8.6* 7.1* 8.2* 8.6* 8.7*  HCT 31.1* 26.7* 21.4* 24.0* 27.0* 27.4*  MCV 101.3* 100.0 100.9*  --  98.9 100.0  PLT 269 223 226  --  282 010   Basic Metabolic Panel: Recent Labs  Lab 03/30/21 0131 03/31/21 0251 03/31/21 1035 04/01/21 0903 04/02/21 0012 04/03/21 0212  NA 137 136 138 137 137 136  K 4.0 3.9 3.9 4.9 4.8 4.8  CL 101 101 99 104 105 106  CO2 30 28  --  25 27 23   GLUCOSE 122* 82 102* 105* 86 145*  BUN 20 24* 24* 28* 31* 30*  CREATININE 1.12* 1.10* 1.00 1.10* 1.11* 1.00  CALCIUM 7.9* 7.8*  --  7.6* 7.8* 7.9*   GFR: Estimated Creatinine Clearance: 25.4 mL/min (by C-G formula based on SCr of 1 mg/dL). Liver Function Tests: No results for input(s): AST, ALT, ALKPHOS, BILITOT, PROT, ALBUMIN in the last 168 hours. No results for input(s): LIPASE, AMYLASE in the last 168 hours. No results for input(s): AMMONIA in the last 168 hours. Coagulation Profile: No results for input(s): INR, PROTIME in the last 168 hours. Cardiac Enzymes: No results for input(s): CKTOTAL, CKMB, CKMBINDEX, TROPONINI in the last 168 hours. BNP (last 3 results) No results for input(s): PROBNP in the  last 8760 hours. HbA1C: No results for input(s): HGBA1C in the last 72 hours. CBG: Recent Labs  Lab 04/02/21 2209 04/03/21 0038 04/03/21 0412 04/03/21 0758 04/03/21 1214  GLUCAP 109* 126* 140* 163* 157*   Lipid Profile: No results for input(s): CHOL, HDL, LDLCALC, TRIG, CHOLHDL, LDLDIRECT in the last 72 hours. Thyroid Function Tests: No results for input(s): TSH, T4TOTAL, FREET4, T3FREE, THYROIDAB in the last 72 hours. Anemia Panel: No results for input(s): VITAMINB12, FOLATE, FERRITIN, TIBC, IRON, RETICCTPCT in the last 72 hours. Sepsis Labs: No results for input(s): PROCALCITON, LATICACIDVEN in the last 168 hours.  Recent Results (from the past 240 hour(s))  Resp Panel by RT-PCR (Flu A&B, Covid) Nasopharyngeal Swab     Status: None    Collection Time: 03/29/21  5:49 PM   Specimen: Nasopharyngeal Swab; Nasopharyngeal(NP) swabs in vial transport medium  Result Value Ref Range Status   SARS Coronavirus 2 by RT PCR NEGATIVE NEGATIVE Final    Comment: (NOTE) SARS-CoV-2 target nucleic acids are NOT DETECTED.  The SARS-CoV-2 RNA is generally detectable in upper respiratory specimens during the acute phase of infection. The lowest concentration of SARS-CoV-2 viral copies this assay can detect is 138 copies/mL. A negative result does not preclude SARS-Cov-2 infection and should not be used as the sole basis for treatment or other patient management decisions. A negative result may occur with  improper specimen collection/handling, submission of specimen other than nasopharyngeal swab, presence of viral mutation(s) within the areas targeted by this assay, and inadequate number of viral copies(<138 copies/mL). A negative result must be combined with clinical observations, patient history, and epidemiological information. The expected result is Negative.  Fact Sheet for Patients:  EntrepreneurPulse.com.au  Fact Sheet for Healthcare Providers:  IncredibleEmployment.be  This test is no t yet approved or cleared by the [place] FDA and  has been authorized for detection and/or diagnosis of SARS-CoV-2 by FDA under an Emergency Use Authorization (EUA). This EUA will remain  in effect (meaning this test can be used) for the duration of the COVID-19 declaration under Section 564(b)(1) of the Act, 21 U.S.C.section 360bbb-3(b)(1), unless the authorization is terminated  or revoked sooner.       Influenza A by PCR NEGATIVE NEGATIVE Final   Influenza B by PCR NEGATIVE NEGATIVE Final    Comment: (NOTE) The Xpert Xpress SARS-CoV-2/FLU/RSV plus assay is intended as an aid in the diagnosis of influenza from Nasopharyngeal swab specimens and should not be used as a sole basis for treatment.  Nasal washings and aspirates are unacceptable for Xpert Xpress SARS-CoV-2/FLU/RSV testing.  Fact Sheet for Patients: EntrepreneurPulse.com.au  Fact Sheet for Healthcare Providers: IncredibleEmployment.be  This test is not yet approved or cleared by the [place] FDA and has been authorized for detection and/or diagnosis of SARS-CoV-2 by FDA under an Emergency Use Authorization (EUA). This EUA will remain in effect (meaning this test can be used) for the duration of the COVID-19 declaration under Section 564(b)(1) of the Act, 21 U.S.C. section 360bbb-3(b)(1), unless the authorization is terminated or revoked.  Performed at [facility], [place] [address], [place], [place] [address]   Surgical pcr screen     Status: None   Collection Time: 03/30/21 12:50 AM   Specimen: Nasal Mucosa; Nasal Swab  Result Value Ref Range Status   MRSA, PCR NEGATIVE NEGATIVE Final   Staphylococcus aureus NEGATIVE NEGATIVE Final    Comment: (NOTE) The Xpert SA Assay (FDA approved for NASAL specimens in patients 22 years of  age and older), is one component of a comprehensive surveillance program. It is not intended to diagnose infection nor to guide or monitor treatment. Performed at [facility], [place] [address], [place], [place] [address]   Culture, Urine     Status: Abnormal   Collection Time: 04/01/21  1:41 PM   Specimen: Urine, Catheterized  Result Value Ref Range Status   Specimen Description URINE, CATHETERIZED  Final   Special Requests   Final    NONE Performed at [facility], [address] [place], [place] [address]    Culture >=100,000 COLONIES/mL ESCHERICHIA COLI (A)  Final   Report Status 04/03/2021 FINAL  Final   Organism ID, Bacteria ESCHERICHIA COLI (A)  Final      Susceptibility   Escherichia coli - MIC*    AMPICILLIN >=32 RESISTANT Resistant     CEFAZOLIN 8 SENSITIVE Sensitive     CEFEPIME <=0.12 SENSITIVE Sensitive      CEFTRIAXONE <=0.25 SENSITIVE Sensitive     CIPROFLOXACIN >=4 RESISTANT Resistant     GENTAMICIN <=1 SENSITIVE Sensitive     IMIPENEM <=0.25 SENSITIVE Sensitive     NITROFURANTOIN <=16 SENSITIVE Sensitive     TRIMETH/SULFA <=20 SENSITIVE Sensitive     AMPICILLIN/SULBACTAM 8 SENSITIVE Sensitive     PIP/TAZO <=4 SENSITIVE Sensitive     * >=100,000 COLONIES/mL ESCHERICHIA COLI         Radiology Studies: No results found.      Scheduled Meds: . apixaban  5 mg Oral BID  . cholecalciferol  1,000 Units Oral Q0600  . docusate sodium  100 mg Oral BID  . insulin aspart  0-9 Units Subcutaneous TID WC  . levothyroxine  200 mcg Oral Q0600  . lidocaine  1 patch Transdermal Q24H  . midodrine  5 mg Oral TID WC  . multivitamin with minerals  1 tablet Oral Q0600  . pantoprazole  20 mg Oral Daily   Continuous Infusions: . cefTRIAXone (ROCEPHIN)  IV 1 g (04/02/21 1712)  . dextrose 5 % and 0.45% NaCl 75 mL/hr at 04/03/21 9563    Assessment & Plan:   Principal Problem:   Closed fracture of right distal femur (HCC) Active Problems:   Chronic duodenal ulcer   GERD (gastroesophageal reflux disease)   Hypothyroidism   Hypertension   Renal insufficiency   Leukocytosis   Chronic pain   Diabetes mellitus type 2 in nonobese (HCC)   Chronic anemia   Iron deficiency anemia   Closed fracture of right distal femur: Fell while being transferred from 1 chair to another.  X-ray showed comminuted distal femur fracture. status post ORIF on 4/27 Orthopedics following, okay to resume Eliquis  PT OT -SNF Follow-up 2 weeks after discharge for repeat x-ray to monitor  4/30- discussed pain mx with son on phone ,as son did not want her to even have little pain. Discussed with him that  it is fine line about overdosing pt with pain meds with complications of respiratory depression, hypotension, confusion, lethargic, etc. Did express to him we have to use different means to control the pain including  pain med, lidocaine patch, and tylenol for now  Leukocytosis-secondary to UTI Trending downward Continue to monitor   E. coli UTI-sensitive to Rocephin Continue antibiotics with IV Rocephin  Recent history of DVT: Recently diagnosed and was started Eliquis 3 weeks ago.  4/29-on eliquis    Chronic normocytic anemia/post op blood loss: Has history of iron deficiency anemia. 4/27-Hg 7.1 , s/p  PRBC this am prior to going for surgery Continue to monitor   Hypertension:now hypotension Continue ivf @75ml  /hr Continue midodrine bp improving   GERD/chronic duodenal ulcer:continue PPI  Diabetes type 2: On sliding scale insulin.  Monitor blood sugars  CKD stage II: Currently kidney function stable and at baseline.  Avoid  Nephrotoxins.  Dementia/debility: Advanced.  Confused at baseline.  She cannot ambulate and is chair bound.  Confused at baseline.  She lives in an ALF.    DVT prophylaxis: eliquis Code Status:DNR Family Communication: Updated son by phone  Status is: Inpatient  Remains inpatient appropriate because:Inpatient level of care appropriate due to severity of illness   Dispo: The patient is from: ALF              Anticipated d/c is to: SNF              Patient currently is not medically stable to d/c.   Difficult to place patient No            LOS: 5 days   Time spent: 35 min with >50% on coc     Nolberto Hanlon, MD [facility] Pager [phone]  If 7PM-7AM, please contact night-coverage 04/03/2021, 1:20 PM

## 2021-04-03 NOTE — Evaluation (Signed)
Clinical/Bedside Swallow Evaluation Patient Details  Name: Casey Watts MRN: [id number] Date of Birth: 23-Apr-1924  Today's Date: 04/03/2021 Time: SLP Start Time (ACUTE ONLY): 0902 SLP Stop Time (ACUTE ONLY): 0917 SLP Time Calculation (min) (ACUTE ONLY): 15 min  Past Medical History:  Past Medical History:  Diagnosis Date  . Anemia   . Arthritis   . Asthma    as a child  . Cancer ([place])    skin cancer on left leg  . Cataract   . Cellulitis and abscess of lower extremity 03/21/2018  . Diabetes mellitus    TYPE 2  . Esophageal stricture   . GERD (gastroesophageal reflux disease)   . Glaucoma   . Hypertension   . Hypothyroidism   . Thyroid disease    Past Surgical History:  Past Surgical History:  Procedure Laterality Date  . APPENDECTOMY    . CATARACT EXTRACTION Left   . EYE SURGERY    . FOOT SURGERY Bilateral    Bunionectomy  . ORIF FEMUR FRACTURE Right 03/31/2021   Procedure: OPEN REDUCTION INTERNAL FIXATION (ORIF) DISTAL FEMUR FRACTURE;  Surgeon: Shona Needles, MD;  Location: [facility];  Service: Orthopedics;  Laterality: Right;  . REVERSE SHOULDER ARTHROPLASTY Left 02/04/2014   DR MURPHY  . REVERSE SHOULDER ARTHROPLASTY Left 02/04/2014   Procedure: REVERSE SHOULDER ARTHROPLASTY;  Surgeon: Renette Butters, MD;  Location: [facility];  Service: Orthopedics;  Laterality: Left;   HPI:  Casey Watts is a 87 y.o. female with medical history significant of anemia, chronic duodenal ulcer, chronic pain, constipation, diabetes, GERD, hypertension, hypothyroidism, CKD, cataracts who presents after a fall at her assisted living facility.  She was found to have a right distal femur fracture and is status post open reduction internal fixation.  Most recent chest xray was showing no evidence of thoracic trauma and right lung linear atelectasis.  CT of the head was showing no acute intracranial abnormality with atrophy and white matter microvascular disease noted.   Assessment / Plan /  Recommendation Clinical Impression  Clinical swallowing evaluation was completed using thin liquids via cup and straw, pureed material and solids from her breakfast tray (ie coffee, applesauce, french toast and sausage).  Per the patient's daughter whom was present she had issues with chicken yesterday.  Prior to admission she was on a regular diet with thin liquids.  Cranial nerve exam was attempted and unable to be completed due to the patient's current mentation.  She presented with a likely oral dysphagia and a possible pharyngeal dysphagia.  She was very slow to masticate regular and soft solids.  Mild oral residue was noted post swallow that cleared with a liquid wash.  Swallow trigger was appreciated to palpation and consistent overt s/s aspiration were not seen.  Patient with cough x 1 given straw sip of her coffee that was not replicated.  Suggest a dysphagia 2 diet with thin liquids.  RN reported that they have been crushing medication in pureed material and patient has been doing well.  RN was encouraged to contact ST if patient having ongoing coughing with intake and to downgrade her to a dysphagia 1/pureed diet if she continued to have issues with mastication.  Safe swallow precautions should be followed and include the following:  be completely upright for all intake, check mouth for pocketing/residue, feed slowly and alternate food/liquids.  ST will follow up for therapeutic diet tolerance and possible diet advancement as her mentation clears. SLP Visit Diagnosis: Dysphagia, unspecified (R13.10)  Aspiration Risk  Mild aspiration risk    Diet Recommendation   Dysphagia 2 diet with thin liquids  Medication Administration: Crushed with puree    Other  Recommendations Oral Care Recommendations: Oral care BID   Follow up Recommendations Other (comment) (TBD)      Frequency and Duration min 2x/week  2 weeks       Prognosis Prognosis for Safe Diet Advancement: Good Barriers to Reach  Goals: Cognitive deficits      Swallow Study   General Date of Onset: 03/29/21 HPI: Casey Watts is a 87 y.o. female with medical history significant of anemia, chronic duodenal ulcer, chronic pain, constipation, diabetes, GERD, hypertension, hypothyroidism, CKD, cataracts who presents after a fall at her assisted living facility.  She was found to have a right distal femur fracture and is status post open reduction internal fixation.  Most recent chest xray was showing no evidence of thoracic trauma and right lung linear atelectasis.  CT of the head was showing no acute intracranial abnormality with atrophy and white matter microvascular disease noted. Type of Study: Bedside Swallow Evaluation Previous Swallow Assessment: None noted at [facility]. Diet Prior to this Study: Regular;Thin liquids Temperature Spikes Noted: No Respiratory Status: Room air History of Recent Intubation: Yes Length of Intubations (days):  (for surgery only) Behavior/Cognition: Alert;Confused;Doesn't follow directions Oral Cavity Assessment: Dry Oral Care Completed by SLP: No Self-Feeding Abilities: Total assist Patient Positioning: Upright in bed Baseline Vocal Quality: Normal Volitional Cough: Cognitively unable to elicit Volitional Swallow: Unable to elicit    Oral/Motor/Sensory Function Overall Oral Motor/Sensory Function: Other (comment) (unable to assess)   Ice Chips Ice chips: Not tested   Thin Liquid Thin Liquid: Impaired Presentation: Straw;Cup Oral Phase Impairments: Reduced labial seal Oral Phase Functional Implications: Right anterior spillage;Left anterior spillage    Nectar Thick Nectar Thick Liquid: Not tested   Honey Thick Honey Thick Liquid: Not tested   Puree Puree: Within functional limits Presentation: Spoon   Solid     Solid: Impaired Presentation: Spoon Oral Phase Impairments: Impaired mastication Oral Phase Functional Implications: Oral residue;Prolonged oral transit;Impaired  mastication     Shelly Flatten, MA, CCC-SLP Acute Rehab SLP [phone]  Lamar Sprinkles 04/03/2021,9:29 AM

## 2021-04-03 NOTE — Plan of Care (Signed)
  Problem: Pain Managment: Goal: General experience of comfort will improve Outcome: Progressing   Problem: Safety: Goal: Ability to remain free from injury will improve Outcome: Progressing   

## 2021-04-04 DIAGNOSIS — K267 Chronic duodenal ulcer without hemorrhage or perforation: Secondary | ICD-10-CM | POA: Diagnosis not present

## 2021-04-04 DIAGNOSIS — D649 Anemia, unspecified: Secondary | ICD-10-CM | POA: Diagnosis not present

## 2021-04-04 DIAGNOSIS — G8929 Other chronic pain: Secondary | ICD-10-CM | POA: Diagnosis not present

## 2021-04-04 DIAGNOSIS — S72401K Unspecified fracture of lower end of right femur, subsequent encounter for closed fracture with nonunion: Secondary | ICD-10-CM | POA: Diagnosis not present

## 2021-04-04 LAB — CBC
HCT: 33.6 % — ABNORMAL LOW (ref 36.0–46.0)
Hemoglobin: 10.9 g/dL — ABNORMAL LOW (ref 12.0–15.0)
MCH: 32.2 pg (ref 26.0–34.0)
MCHC: 32.4 g/dL (ref 30.0–36.0)
MCV: 99.4 fL (ref 80.0–100.0)
Platelets: 366 10*3/uL (ref 150–400)
RBC: 3.38 MIL/uL — ABNORMAL LOW (ref 3.87–5.11)
RDW: 15.9 % — ABNORMAL HIGH (ref 11.5–15.5)
WBC: 12.7 10*3/uL — ABNORMAL HIGH (ref 4.0–10.5)
nRBC: 0 % (ref 0.0–0.2)

## 2021-04-04 LAB — GLUCOSE, CAPILLARY
Glucose-Capillary: 117 mg/dL — ABNORMAL HIGH (ref 70–99)
Glucose-Capillary: 126 mg/dL — ABNORMAL HIGH (ref 70–99)
Glucose-Capillary: 156 mg/dL — ABNORMAL HIGH (ref 70–99)
Glucose-Capillary: 174 mg/dL — ABNORMAL HIGH (ref 70–99)
Glucose-Capillary: 74 mg/dL (ref 70–99)
Glucose-Capillary: 91 mg/dL (ref 70–99)
Glucose-Capillary: 98 mg/dL (ref 70–99)

## 2021-04-04 MED ORDER — HALOPERIDOL LACTATE 5 MG/ML IJ SOLN: 0.5000 mg | Freq: Four times a day (QID) | INTRAMUSCULAR | Status: DC | PRN

## 2021-04-04 MED ORDER — TRAZODONE HCL 50 MG PO TABS: 25.0000 mg | ORAL_TABLET | Freq: Every evening | ORAL | Status: DC | PRN

## 2021-04-04 NOTE — Progress Notes (Addendum)
PROGRESS NOTE    Casey Watts  [id number] DOB: [birth year] DOA: 03/29/2021 PCP: Pcp, No    Brief Narrative:  Patient is a 87 year old female with history of anemia, chronic duodenal ulcer, chronic pain syndrome, constipation, diabetes type 2, GERD, hypertension, CKD who presented after a fall at her assisted living facility.  She was about to be transferred from 1 chair to another and sustained fall during that time.  X-rays showed right comminuted distal femur fracture.  Orthopedics consulted.  Family unsure consenting for surgery right away so family discussing with orthopedics about risks and benefits before proceeding for surgery..  4/27-plan for surgery today. S/p 1 Unit PRBC overnight.   4/30- pt was given 2 pain vicodin, when I saw pt pt was in deep sleep. SLP evaluated pt and placed on dysphagia 2 diet with thin liquids.  5/1-family concerned with pts pain. Son and wife at bedside. Discussed adding gabapentin to regimen. Son reports brother wants her to have more pain meds or even iv pain gtt...told son, we cannot put her on ivgtt pain med as this is not standard of care and dangerous with her age. Discussed that it is a fine line how much pain meds we can give her to prevent complications including repiratory depression, lethargey, confusion, sleeping all bed causing less po intake , and more. Had a discussion with son Rush Landmark via phone who would like to pursue hospice evaluation as he is worried about her mom not bouncing back to her baseline and suffering of too much pain.   Consultants:   orthopedics  Procedures:   Antimicrobials:       Subjective: Pt confused this am. Denies sob, cp  Objective: Vitals:   04/03/21 1451 04/03/21 1932 04/04/21 0519 04/04/21 0759  BP: 119/71 115/63 113/75 126/74  Pulse: (!) 107 (!) 108 (!) 108 (!) 108  Resp: 18 18 18    Temp: 97.8 F (36.6 C) (!) 97.5 F (36.4 C) (!) 97.5 F (36.4 C) (!) 97.5 F (36.4 C)  TempSrc: Oral Oral Oral  Oral  SpO2: (!) 86% 97% 97% 97%  Weight:      Height:        Intake/Output Summary (Last 24 hours) at 04/04/2021 0930 Last data filed at 04/03/2021 2030 Gross per 24 hour  Intake --  Output 250 ml  Net -250 ml   Filed Weights   03/29/21 2300 03/31/21 0906  Weight: 51.7 kg 51.7 kg    Examination: Lying in bed, nad cta no w/r/r Regular s1/s2 no gallop Soft benign +bs No edema Confused, grossly intact   Data Reviewed: I have personally reviewed following labs and imaging studies  CBC: Recent Labs  Lab 03/29/21 1749 03/30/21 0131 03/31/21 0251 03/31/21 1035 04/01/21 0857 04/02/21 0012 04/04/21 0104  WBC 14.8* 12.0* 11.9*  --  16.0* 14.4* 12.7*  NEUTROABS 12.7*  --  8.0*  --   --   --   --   HGB 9.9* 8.6* 7.1* 8.2* 8.6* 8.7* 10.9*  HCT 31.1* 26.7* 21.4* 24.0* 27.0* 27.4* 33.6*  MCV 101.3* 100.0 100.9*  --  98.9 100.0 99.4  PLT 269 223 226  --  282 283 [id number]   Basic Metabolic Panel: Recent Labs  Lab 03/30/21 0131 03/31/21 0251 03/31/21 1035 04/01/21 0903 04/02/21 0012 04/03/21 0212  NA 137 136 138 137 137 136  K 4.0 3.9 3.9 4.9 4.8 4.8  CL 101 101 99 104 105 106  CO2 30 28  --  25 27 23  GLUCOSE 122* 82 102* 105* 86 145*  BUN 20 24* 24* 28* 31* 30*  CREATININE 1.12* 1.10* 1.00 1.10* 1.11* 1.00  CALCIUM 7.9* 7.8*  --  7.6* 7.8* 7.9*   GFR: Estimated Creatinine Clearance: 25.4 mL/min (by C-G formula based on SCr of 1 mg/dL). Liver Function Tests: No results for input(s): AST, ALT, ALKPHOS, BILITOT, PROT, ALBUMIN in the last 168 hours. No results for input(s): LIPASE, AMYLASE in the last 168 hours. No results for input(s): AMMONIA in the last 168 hours. Coagulation Profile: No results for input(s): INR, PROTIME in the last 168 hours. Cardiac Enzymes: No results for input(s): CKTOTAL, CKMB, CKMBINDEX, TROPONINI in the last 168 hours. BNP (last 3 results) No results for input(s): PROBNP in the last 8760 hours. HbA1C: No results for input(s): HGBA1C in the  last 72 hours. CBG: Recent Labs  Lab 04/03/21 1629 04/03/21 2025 04/04/21 0010 04/04/21 0427 04/04/21 0522  GLUCAP 158* 133* 126* 117* 98   Lipid Profile: No results for input(s): CHOL, HDL, LDLCALC, TRIG, CHOLHDL, LDLDIRECT in the last 72 hours. Thyroid Function Tests: No results for input(s): TSH, T4TOTAL, FREET4, T3FREE, THYROIDAB in the last 72 hours. Anemia Panel: No results for input(s): VITAMINB12, FOLATE, FERRITIN, TIBC, IRON, RETICCTPCT in the last 72 hours. Sepsis Labs: No results for input(s): PROCALCITON, LATICACIDVEN in the last 168 hours.  Recent Results (from the past 240 hour(s))  Resp Panel by RT-PCR (Flu A&B, Covid) Nasopharyngeal Swab     Status: None   Collection Time: 03/29/21  5:49 PM   Specimen: Nasopharyngeal Swab; Nasopharyngeal(NP) swabs in vial transport medium  Result Value Ref Range Status   SARS Coronavirus 2 by RT PCR NEGATIVE NEGATIVE Final    Comment: (NOTE) SARS-CoV-2 target nucleic acids are NOT DETECTED.  The SARS-CoV-2 RNA is generally detectable in upper respiratory specimens during the acute phase of infection. The lowest concentration of SARS-CoV-2 viral copies this assay can detect is 138 copies/mL. A negative result does not preclude SARS-Cov-2 infection and should not be used as the sole basis for treatment or other patient management decisions. A negative result may occur with  improper specimen collection/handling, submission of specimen other than nasopharyngeal swab, presence of viral mutation(s) within the areas targeted by this assay, and inadequate number of viral copies(<138 copies/mL). A negative result must be combined with clinical observations, patient history, and epidemiological information. The expected result is Negative.  Fact Sheet for Patients:  EntrepreneurPulse.com.au  Fact Sheet for Healthcare Providers:  IncredibleEmployment.be  This test is no t yet approved or cleared  by the [place] FDA and  has been authorized for detection and/or diagnosis of SARS-CoV-2 by FDA under an Emergency Use Authorization (EUA). This EUA will remain  in effect (meaning this test can be used) for the duration of the COVID-19 declaration under Section 564(b)(1) of the Act, 21 U.S.C.section 360bbb-3(b)(1), unless the authorization is terminated  or revoked sooner.       Influenza A by PCR NEGATIVE NEGATIVE Final   Influenza B by PCR NEGATIVE NEGATIVE Final    Comment: (NOTE) The Xpert Xpress SARS-CoV-2/FLU/RSV plus assay is intended as an aid in the diagnosis of influenza from Nasopharyngeal swab specimens and should not be used as a sole basis for treatment. Nasal washings and aspirates are unacceptable for Xpert Xpress SARS-CoV-2/FLU/RSV testing.  Fact Sheet for Patients: EntrepreneurPulse.com.au  Fact Sheet for Healthcare Providers: IncredibleEmployment.be  This test is not yet approved or cleared by the [place] and has been authorized  for detection and/or diagnosis of SARS-CoV-2 by FDA under an Emergency Use Authorization (EUA). This EUA will remain in effect (meaning this test can be used) for the duration of the COVID-19 declaration under Section 564(b)(1) of the Act, 21 U.S.C. section 360bbb-3(b)(1), unless the authorization is terminated or revoked.  Performed at [facility], [place] [address], [place], [place] [address]   Surgical pcr screen     Status: None   Collection Time: 03/30/21 12:50 AM   Specimen: Nasal Mucosa; Nasal Swab  Result Value Ref Range Status   MRSA, PCR NEGATIVE NEGATIVE Final   Staphylococcus aureus NEGATIVE NEGATIVE Final    Comment: (NOTE) The Xpert SA Assay (FDA approved for NASAL specimens in patients 43 years of age and older), is one component of a comprehensive surveillance program. It is not intended to diagnose infection nor to guide or monitor treatment. Performed at  [facility], [place] [address] [place], [place] [address]   Culture, Urine     Status: Abnormal   Collection Time: 04/01/21  1:41 PM   Specimen: Urine, Catheterized  Result Value Ref Range Status   Specimen Description URINE, CATHETERIZED  Final   Special Requests   Final    NONE Performed at [facility], [address], [place], [place] [address]    Culture >=100,000 COLONIES/mL ESCHERICHIA COLI (A)  Final   Report Status 04/03/2021 FINAL  Final   Organism ID, Bacteria ESCHERICHIA COLI (A)  Final      Susceptibility   Escherichia coli - MIC*    AMPICILLIN >=32 RESISTANT Resistant     CEFAZOLIN 8 SENSITIVE Sensitive     CEFEPIME <=0.12 SENSITIVE Sensitive     CEFTRIAXONE <=0.25 SENSITIVE Sensitive     CIPROFLOXACIN >=4 RESISTANT Resistant     GENTAMICIN <=1 SENSITIVE Sensitive     IMIPENEM <=0.25 SENSITIVE Sensitive     NITROFURANTOIN <=16 SENSITIVE Sensitive     TRIMETH/SULFA <=20 SENSITIVE Sensitive     AMPICILLIN/SULBACTAM 8 SENSITIVE Sensitive     PIP/TAZO <=4 SENSITIVE Sensitive     * >=100,000 COLONIES/mL ESCHERICHIA COLI         Radiology Studies: No results found.      Scheduled Meds: . apixaban  5 mg Oral BID  . cholecalciferol  1,000 Units Oral Q0600  . docusate sodium  100 mg Oral BID  . insulin aspart  0-9 Units Subcutaneous TID WC  . levothyroxine  200 mcg Oral Q0600  . lidocaine  1 patch Transdermal Q24H  . midodrine  5 mg Oral TID WC  . multivitamin with minerals  1 tablet Oral Q0600  . pantoprazole  20 mg Oral Daily   Continuous Infusions: . cefTRIAXone (ROCEPHIN)  IV 1 g (04/03/21 1526)  . dextrose 5 % and 0.45% NaCl 75 mL/hr at 04/03/21 5361    Assessment & Plan:   Principal Problem:   Closed fracture of right distal femur (HCC) Active Problems:   Chronic duodenal ulcer   GERD (gastroesophageal reflux disease)   Hypothyroidism   Hypertension   Renal insufficiency   Leukocytosis   Chronic pain   Diabetes mellitus type  2 in nonobese (HCC)   Chronic anemia   Iron deficiency anemia   Closed fracture of right distal femur: Fell while being transferred from 1 chair to another.  X-ray showed comminuted distal femur fracture. status post ORIF on 4/27 Orthopedics following, okay to resume Eliquis  PT OT -SNF Follow-up 2 weeks after discharge for  repeat x-ray to monitor  4/30- discussed pain mx with son on phone ,as son did not want her to even have little pain. Discussed with him that  it is fine line about overdosing pt with pain meds with complications of respiratory depression, hypotension, confusion, lethargic, etc. Did express to him we have to use different means to control the pain including pain med, lidocaine patch, and tylenol for now 5/1-add gabapentin 100mg  bid Per son's request consult to hospice  Leukocytosis-secondary to UTI Trending downward Continue to monitor  E. coli UTI-sensitive to Rocephin Continue with IV Rocephin  Recent history of DVT: Recently diagnosed and was started Eliquis 3 weeks ago.  5/1-on Eliquis H&H stable    Chronic normocytic anemia/post op blood loss: Has history of iron deficiency anemia. 4/27-Hg 7.1 , s/p PRBC this am prior to going for surgery Continue to monitor   Hypertension:now hypotension Continue ivf @75ml  /hr Continue midodrine bp improving   GERD/chronic duodenal ulcer:continue PPI  Diabetes type 2: On sliding scale insulin.  Monitor blood sugars  CKD stage II: Currently kidney function stable and at baseline.  Avoid  Nephrotoxins.  Dementia/debility: Advanced.  Confused at baseline.  She cannot ambulate and is chair bound.  Confused at baseline.  She lives in an ALF.    DVT prophylaxis: eliquis Code Status:DNR Family Communication: Updated son, bill  by phone. Also other family members at bedside  Status is: Inpatient  Remains inpatient appropriate because:Inpatient level of care appropriate due to severity of  illness   Dispo: The patient is from: ALF              Anticipated d/c is to: SNF              Patient currently is not medically stable to d/c.   Difficult to place patient No            LOS: 6 days   Time spent: 35 min with >50% on coc     Nolberto Hanlon, MD [facility] Pager [phone]  If 7PM-7AM, please contact night-coverage 04/04/2021, 9:30 AM

## 2021-04-04 NOTE — TOC Progression Note (Signed)
Transition of Care [facility]) - Progression Note    Patient Details  Name: Casey Watts MRN: [id number] Date of Birth: March 18, 1924  Transition of Care [facility]) CM/SW [place], [place] Phone Number: 04/04/2021, 11:17 AM  Clinical Narrative:    CSW spoke with pt's son Casey Watts. Requesting MD call in reference to questions about possible hospice care vs SNF. Casey Watts states that he would choose [facility] for SNF but wants to speak with MD first. Casey Watts states that he feels pt's current state is a poor quality of life and is concerned about if pt can complete rehab. CSW adv that she would reach out to MD.    Expected Discharge Plan: [place] Barriers to Discharge: Continued Medical Work up  Expected Discharge Plan and Services Expected Discharge Plan: [place]   Discharge Planning Services: CM Consult                                           Social Determinants of Health (SDOH) Interventions    Readmission Risk Interventions No flowsheet data found.

## 2021-04-04 NOTE — Progress Notes (Signed)
[facility] ([facility]) Hospital Liaison Note  Received request from TOC to meet with family to discuss hospice services after discharge. Hospital liaison met at bedside with pt's son Wally and his spouse to initiate education related to hospice philosophy and services and to answer any questions at this time.  Wally verbalized understanding of information given.  Per discussion the plan is to evaluate pt for rehab at a SNF vs. Seeking permanent placement at a SNF or possibly at [facility] if they would take pt back with hospice support.  Wally plans to discuss with his brother.    Plan made for an [facility] liaison to follow along to assist as needed.    [facility] information and contact numbers given to Wally.  Above information shared with Angela, TOC Manager.  Please call with any questions or concerns.  Thank you for the opportunity to participate in this pt's care.  [name] King, BSN, RN [facility] Hospital Liaison [phone] (24h on call) 

## 2021-04-04 NOTE — Progress Notes (Incomplete)
Manufacturing engineer [facility]) hospital liaison note

## 2021-04-05 DIAGNOSIS — D649 Anemia, unspecified: Secondary | ICD-10-CM | POA: Diagnosis not present

## 2021-04-05 DIAGNOSIS — S72401K Unspecified fracture of lower end of right femur, subsequent encounter for closed fracture with nonunion: Secondary | ICD-10-CM | POA: Diagnosis not present

## 2021-04-05 DIAGNOSIS — G8929 Other chronic pain: Secondary | ICD-10-CM | POA: Diagnosis not present

## 2021-04-05 DIAGNOSIS — D508 Other iron deficiency anemias: Secondary | ICD-10-CM

## 2021-04-05 DIAGNOSIS — K267 Chronic duodenal ulcer without hemorrhage or perforation: Secondary | ICD-10-CM | POA: Diagnosis not present

## 2021-04-05 LAB — GLUCOSE, CAPILLARY
Glucose-Capillary: 107 mg/dL — ABNORMAL HIGH (ref 70–99)
Glucose-Capillary: 117 mg/dL — ABNORMAL HIGH (ref 70–99)
Glucose-Capillary: 134 mg/dL — ABNORMAL HIGH (ref 70–99)
Glucose-Capillary: 153 mg/dL — ABNORMAL HIGH (ref 70–99)
Glucose-Capillary: 111 mg/dL — ABNORMAL HIGH (ref 70–99)

## 2021-04-05 MED ORDER — HYDROMORPHONE HCL 1 MG/ML IJ SOLN: 0.5000 mg | Freq: Once | INTRAMUSCULAR | Status: DC

## 2021-04-05 MED ORDER — HYDROMORPHONE HCL 1 MG/ML IJ SOLN
1.0000 mg | INTRAMUSCULAR | Status: DC | PRN
  Administered 2021-04-05: 1 mg via INTRAVENOUS
  Filled 2021-04-05: qty 1

## 2021-04-05 NOTE — Progress Notes (Signed)
Occupational Therapy Treatment Patient Details Name: Casey Watts MRN: [id number] DOB: 05/25/1924 Today's Date: 04/05/2021    History of present illness 87 yo female s/p ORIF R distal femur fx on 4/27 after fall at assisted living facility on 4/25. PMH includes DVT 03/2021 now on eliquis, anemia, chronic duodenal ulcer, chronic pain, constipation, diabetes, GERD, hypertension, hypothyroidism, CKD, cataracts, L RSA 2015, dementia.   OT comments  Pt is having difficulties progressing towards OT goals due to pain. Pt requiring Total assist +2 for bed mobility and transfers. OT assisted pt with washing her face and eating a few bites of breakfast. Pt requiring tactile cues and minimal assist to attend to tasks once initiated. OT to continue following up to assist with positioning and mobility.    Follow Up Recommendations  SNF;Supervision/Assistance - 24 hour    Equipment Recommendations  None recommended by OT    Recommendations for Other Services      Precautions / Restrictions Precautions Precautions: Fall;Other (comment) Precaution Comments: Femur fx Restrictions Weight Bearing Restrictions: Yes RLE Weight Bearing: Weight bearing as tolerated       Mobility Bed Mobility Overal bed mobility: Needs Assistance Bed Mobility: Supine to Sit     Supine to sit: Max assist;+2 for physical assistance;+2 for safety/equipment     General bed mobility comments: OT and tech assisted pt to EOB using the helicopter manuever. Pt pushing back and to the left, requiring max support to maintain upright posture EOB.    Transfers Overall transfer level: Needs assistance Equipment used: 2 person hand held assist Transfers: Sit to/from [employer] Sit to Stand: Total assist;+2 physical assistance;+2 safety/equipment   Squat pivot transfers: Total assist;+2 physical assistance;+2 safety/equipment     General transfer comment: Pt resistant to stand due to pain, requiring max  encouragement and total assist for transfer    Balance Overall balance assessment: Needs assistance Sitting-balance support: Bilateral upper extremity supported;Feet supported Sitting balance-Leahy Scale: Poor Sitting balance - Comments: Pt forcefully leaning backwards sitting EOB. Requiring max support with intermittent min support to maintain midline. Postural control: Posterior lean;Left lateral lean Standing balance support: Bilateral upper extremity supported Standing balance-Leahy Scale: Zero Standing balance comment: Pt requiring total assist +2 to squat transfer to chair.                           ADL either performed or assessed with clinical judgement   ADL Overall ADL's : Needs assistance/impaired Eating/Feeding: Minimal assistance;Bed level Eating/Feeding Details (indicate cue type and reason): Pt given apple sauce, attempting to feed herself, however having difficulty bringing to mouth to eat. Min assist for motor planning to complete action of bringing applesauce to mouth. Grooming: Dance movement psychotherapist;Bed level;Minimal assistance Grooming Details (indicate cue type and reason): Pt able to bring wash cloth to her face, however needed assistance in sustaining the task to wash her face.                 Toilet Transfer: Total assistance;+2 for physical assistance;+2 for safety/equipment;Squat-pivot;BSC Toilet Transfer Details (indicate cue type and reason): Simulated transferring to chair. Pt requuired total assistance +2 to complete a squat pivot to chair. Maxi move pad placed under her to assist nursing in placing her back to bed later.         Functional mobility during ADLs: Total assistance;+2 for physical assistance;+2 for safety/equipment General ADL Comments: Pt resistant to movement due to pain. When seated EOB pt forcefully pushed  back requiring max support to remain sitting upright EOB. Without support at this time pt would be unable to sit and complete  basic ADL's.     Vision       Perception     Praxis      Cognition Arousal/Alertness: Awake/alert Behavior During Therapy: Anxious Overall Cognitive Status: History of cognitive impairments - at baseline                                 General Comments: Dementia at baseline, unable to provide much insight into prior levels of functioning or living situation. Pleasantly confused. Resistant against movement due to pain.        Exercises     Shoulder Instructions       General Comments VSS on RA    Pertinent Vitals/ Pain       Pain Assessment: Faces Faces Pain Scale: Hurts whole lot Pain Location: RLE Pain Descriptors / Indicators: Discomfort;Grimacing;Guarding;Moaning Pain Intervention(s): Limited activity within patient's tolerance;Repositioned;Premedicated before session  Home Living                                          Prior Functioning/Environment              Frequency  Min 2X/week        Progress Toward Goals  OT Goals(current goals can now be found in the care plan section)  Progress towards OT goals: Progressing toward goals  Acute Rehab OT Goals Patient Stated Goal: To stop hurting OT Goal Formulation: Patient unable to participate in goal setting Time For Goal Achievement: 04/15/21 Potential to Achieve Goals: Fair ADL Goals Pt Will Perform Grooming: with supervision;sitting Pt Will Transfer to Toilet: with mod assist;stand pivot transfer;bedside commode Additional ADL Goal #1: Pt will tolerate sitting EOB for 3+ mins unsupported in preparation for seated ADL's  Plan Discharge plan remains appropriate;Frequency remains appropriate    Co-evaluation                 AM-PAC OT "6 Clicks" Daily Activity     Outcome Measure   Help from another person eating meals?: A Little Help from another person taking care of personal grooming?: A Little Help from another person toileting, which includes using  toliet, bedpan, or urinal?: Total Help from another person bathing (including washing, rinsing, drying)?: Total Help from another person to put on and taking off regular upper body clothing?: A Little Help from another person to put on and taking off regular lower body clothing?: Total 6 Click Score: 12    End of Session Equipment Utilized During Treatment: Gait belt  OT Visit Diagnosis: Unsteadiness on feet (R26.81);Repeated falls (R29.6);Muscle weakness (generalized) (M62.81);History of falling (Z91.81)   Activity Tolerance Patient limited by pain   Patient Left in chair;with call bell/phone within reach;with chair alarm set   Nurse Communication Mobility status;Need for lift equipment        Time: [phone] OT Time Calculation (min): 29 min  Charges: OT General Charges $OT Visit: 1 Visit OT Treatments $Self Care/Home Management : 23-37 mins  Eugene Isadore H., OTR/L [place] 04/05/2021, 10:24 AM

## 2021-04-05 NOTE — Progress Notes (Signed)
PT Cancellation Note  Patient Details Name: Casey Watts MRN: [id number] DOB: 09-Oct-1924   Cancelled Treatment:    Reason Eval/Treat Not Completed: (P) Other (comment) (Order d/c as patient is now comfort care.  Will defer PT at this time based on change of POC.)   Talena Neira J Stann [place] 04/05/2021, 2:46 PM  Erasmo Leventhal , PTA Acute Rehabilitation Services Pager [phone] Office [phone]

## 2021-04-05 NOTE — TOC Progression Note (Addendum)
Transition of Care [facility]) - Progression Note    Patient Details  Name: Casey Watts MRN: [id number] Date of Birth: [birth year]  [facility] CM/SW Contact  Sharin Mons, RN Phone Number: 04/05/2021, 3:23 PM  Clinical Narrative:    NCM received message from Jennifer/[facility]  Informing NCM pt doesn't qualify for residential hospice care. NCM shared information with son Rush Landmark. Bill stated he would like for pt to d/c back to [facility] with home hospice care. NCM called [facility] and spoke with nurse Coralyn Mark. Coralyn Mark confirmed pt can return back to [facility] with hospice care. Pt's son Rush Landmark also stated he spoke with Loretto Anderson [place] and was told pt can return to ALF once medically ready.  NCM made Jennifer/[facility] aware of d/c plan, ALF with hospice care. Per son Rush Landmark  pt will  need a hospital bed @ ALF, NCM made [facility] aware. Anderson Malta stated hospital bed will be ordered and  delivered to ALF prior to d/c pending Authorization for home hospice care.  TOC team monitoring and will continue to assist with needs.   04/07/2021 @ 1315 NCM made aware by [facility]/ [name]  hospice approval received.  NCM called [employer] [phone]) Anderson Malta ( Administrator) and  informed her of pt's hospice approval. Anderson Malta stated since pt has been gone from facility > 3 days and they would like to assess pt @ hospital to determine if they can manage @ ALF. Nurse Nicole Kindred will come to hospital this evening or tomorrow am to do assessment.    Expected Discharge Plan: Assisted Living (with hospice care) Barriers to Discharge: Continued Medical Work up  Expected Discharge Plan and Services Expected Discharge Plan: Assisted Living (with hospice care)   Discharge Planning Services: CM Consult                                           Social Determinants of Health (SDOH) Interventions     Readmission Risk Interventions No flowsheet data found.

## 2021-04-05 NOTE — Progress Notes (Signed)
PROGRESS NOTE    Casey Watts  RCV:[id number] DOB: 07/05/1924 DOA: 03/29/2021 PCP: Pcp, No    Brief Narrative:  Patient is a 87 year old female with history of anemia, chronic duodenal ulcer, chronic pain syndrome, constipation, diabetes type 2, GERD, hypertension, CKD who presented after a fall at her assisted living facility.  She was about to be transferred from 1 chair to another and sustained fall during that time.  X-rays showed right comminuted distal femur fracture.  Orthopedics consulted.  Family unsure consenting for surgery right away so family discussing with orthopedics about risks and benefits before proceeding for surgery..  4/27-plan for surgery today. S/p 1 Unit PRBC overnight.   4/30- pt was given 2 pain vicodin, when I saw pt pt was in deep sleep. SLP evaluated pt and placed on dysphagia 2 diet with thin liquids.  5/1-family concerned with pts pain. Son and wife at bedside. Discussed adding gabapentin to regimen. Son reports brother wants her to have more pain meds or even iv pain gtt...told son, we cannot put her on ivgtt pain med as this is not standard of care and dangerous with her age. Discussed that it is a fine line how much pain meds we can give her to prevent complications including repiratory depression, lethargey, confusion, sleeping all bed causing less po intake , and more. Had a discussion with son Rush Landmark via phone who would like to pursue hospice evaluation as he is worried about her mom not bouncing back to her baseline and suffering of too much pain.   5/2-had long discussion with son Rush Landmark this am in hospital. He reports was interested pt going to hospice house. He wanted mom to be given more iv pain meds as she is uncomfortable and reports he understands the complications of increase iv pain meds. He also does not want PT for her mom as he thinks its causing her more pain with movement. We discussed if she has no mobility px is even worse, and he reports he  understands and just does not want pt to suffer from pain. Pt continues with decrease po intake. Although this am ate little more than other days.  Consultants:   orthopedics  Procedures:   Antimicrobials:       Subjective: Patient sitting in chair, comfortable.  Not interactive  Objective: Vitals:   04/04/21 2009 04/05/21 0357 04/05/21 0733 04/05/21 0913  BP: 113/68 110/72 135/88   Pulse: (!) 108 100 (!) 106 98  Resp: 16 18 17    Temp: 98 F (36.7 C) 98.6 F (37 C) (!) 97.4 F (36.3 C)   TempSrc: Oral Oral Axillary   SpO2: 98% 99% 97%   Weight:      Height:        Intake/Output Summary (Last 24 hours) at 04/05/2021 1426 Last data filed at 04/05/2021 1409 Gross per 24 hour  Intake 2617.34 ml  Output 1050 ml  Net 1567.34 ml   Filed Weights   03/29/21 2300 03/31/21 0906  Weight: 51.7 kg 51.7 kg    Examination: Confused CTA no wheezing Regular S1-S2 Soft benign positive bowel sounds No edema   Data Reviewed: I have personally reviewed following labs and imaging studies  CBC: Recent Labs  Lab 03/29/21 1749 03/30/21 0131 03/31/21 0251 03/31/21 1035 04/01/21 0857 04/02/21 0012 04/04/21 0104  WBC 14.8* 12.0* 11.9*  --  16.0* 14.4* 12.7*  NEUTROABS 12.7*  --  8.0*  --   --   --   --   HGB  9.9* 8.6* 7.1* 8.2* 8.6* 8.7* 10.9*  HCT 31.1* 26.7* 21.4* 24.0* 27.0* 27.4* 33.6*  MCV 101.3* 100.0 100.9*  --  98.9 100.0 99.4  PLT 269 223 226  --  282 283 456   Basic Metabolic Panel: Recent Labs  Lab 03/30/21 0131 03/31/21 0251 03/31/21 1035 04/01/21 0903 04/02/21 0012 04/03/21 0212  NA 137 136 138 137 137 136  K 4.0 3.9 3.9 4.9 4.8 4.8  CL 101 101 99 104 105 106  CO2 30 28  --  25 27 23   GLUCOSE 122* 82 102* 105* 86 145*  BUN 20 24* 24* 28* 31* 30*  CREATININE 1.12* 1.10* 1.00 1.10* 1.11* 1.00  CALCIUM 7.9* 7.8*  --  7.6* 7.8* 7.9*   GFR: Estimated Creatinine Clearance: 25.4 mL/min (by C-G formula based on SCr of 1 mg/dL). Liver Function Tests: No  results for input(s): AST, ALT, ALKPHOS, BILITOT, PROT, ALBUMIN in the last 168 hours. No results for input(s): LIPASE, AMYLASE in the last 168 hours. No results for input(s): AMMONIA in the last 168 hours. Coagulation Profile: No results for input(s): INR, PROTIME in the last 168 hours. Cardiac Enzymes: No results for input(s): CKTOTAL, CKMB, CKMBINDEX, TROPONINI in the last 168 hours. BNP (last 3 results) No results for input(s): PROBNP in the last 8760 hours. HbA1C: No results for input(s): HGBA1C in the last 72 hours. CBG: Recent Labs  Lab 04/04/21 2047 04/04/21 2319 04/05/21 0401 04/05/21 0738 04/05/21 1154  GLUCAP 91 74 107* 117* 134*   Lipid Profile: No results for input(s): CHOL, HDL, LDLCALC, TRIG, CHOLHDL, LDLDIRECT in the last 72 hours. Thyroid Function Tests: No results for input(s): TSH, T4TOTAL, FREET4, T3FREE, THYROIDAB in the last 72 hours. Anemia Panel: No results for input(s): VITAMINB12, FOLATE, FERRITIN, TIBC, IRON, RETICCTPCT in the last 72 hours. Sepsis Labs: No results for input(s): PROCALCITON, LATICACIDVEN in the last 168 hours.  Recent Results (from the past 240 hour(s))  Resp Panel by RT-PCR (Flu A&B, Covid) Nasopharyngeal Swab     Status: None   Collection Time: 03/29/21  5:49 PM   Specimen: Nasopharyngeal Swab; Nasopharyngeal(NP) swabs in vial transport medium  Result Value Ref Range Status   SARS Coronavirus 2 by RT PCR NEGATIVE NEGATIVE Final    Comment: (NOTE) SARS-CoV-2 target nucleic acids are NOT DETECTED.  The SARS-CoV-2 RNA is generally detectable in upper respiratory specimens during the acute phase of infection. The lowest concentration of SARS-CoV-2 viral copies this assay can detect is 138 copies/mL. A negative result does not preclude SARS-Cov-2 infection and should not be used as the sole basis for treatment or other patient management decisions. A negative result may occur with  improper specimen collection/handling, submission  of specimen other than nasopharyngeal swab, presence of viral mutation(s) within the areas targeted by this assay, and inadequate number of viral copies(<138 copies/mL). A negative result must be combined with clinical observations, patient history, and epidemiological information. The expected result is Negative.  Fact Sheet for Patients:  EntrepreneurPulse.com.au  Fact Sheet for Healthcare Providers:  IncredibleEmployment.be  This test is no t yet approved or cleared by the [place] FDA and  has been authorized for detection and/or diagnosis of SARS-CoV-2 by FDA under an Emergency Use Authorization (EUA). This EUA will remain  in effect (meaning this test can be used) for the duration of the COVID-19 declaration under Section 564(b)(1) of the Act, 21 U.S.C.section 360bbb-3(b)(1), unless the authorization is terminated  or revoked sooner.  Influenza A by PCR NEGATIVE NEGATIVE Final   Influenza B by PCR NEGATIVE NEGATIVE Final    Comment: (NOTE) The Xpert Xpress SARS-CoV-2/FLU/RSV plus assay is intended as an aid in the diagnosis of influenza from Nasopharyngeal swab specimens and should not be used as a sole basis for treatment. Nasal washings and aspirates are unacceptable for Xpert Xpress SARS-CoV-2/FLU/RSV testing.  Fact Sheet for Patients: EntrepreneurPulse.com.au  Fact Sheet for Healthcare Providers: IncredibleEmployment.be  This test is not yet approved or cleared by the [place] FDA and has been authorized for detection and/or diagnosis of SARS-CoV-2 by FDA under an Emergency Use Authorization (EUA). This EUA will remain in effect (meaning this test can be used) for the duration of the COVID-19 declaration under Section 564(b)(1) of the Act, 21 U.S.C. section 360bbb-3(b)(1), unless the authorization is terminated or revoked.  Performed at [facility], [place] [address],  [place], [place] [address]   Surgical pcr screen     Status: None   Collection Time: 03/30/21 12:50 AM   Specimen: Nasal Mucosa; Nasal Swab  Result Value Ref Range Status   MRSA, PCR NEGATIVE NEGATIVE Final   Staphylococcus aureus NEGATIVE NEGATIVE Final    Comment: (NOTE) The Xpert SA Assay (FDA approved for NASAL specimens in patients 93 years of age and older), is one component of a comprehensive surveillance program. It is not intended to diagnose infection nor to guide or monitor treatment. Performed at [facility], [place] [address] [place], [place] [address]   Culture, Urine     Status: Abnormal   Collection Time: 04/01/21  1:41 PM   Specimen: Urine, Catheterized  Result Value Ref Range Status   Specimen Description URINE, CATHETERIZED  Final   Special Requests   Final    NONE Performed at [facility], [address] [place], [place] [address]    Culture >=100,000 COLONIES/mL ESCHERICHIA COLI (A)  Final   Report Status 04/03/2021 FINAL  Final   Organism ID, Bacteria ESCHERICHIA COLI (A)  Final      Susceptibility   Escherichia coli - MIC*    AMPICILLIN >=32 RESISTANT Resistant     CEFAZOLIN 8 SENSITIVE Sensitive     CEFEPIME <=0.12 SENSITIVE Sensitive     CEFTRIAXONE <=0.25 SENSITIVE Sensitive     CIPROFLOXACIN >=4 RESISTANT Resistant     GENTAMICIN <=1 SENSITIVE Sensitive     IMIPENEM <=0.25 SENSITIVE Sensitive     NITROFURANTOIN <=16 SENSITIVE Sensitive     TRIMETH/SULFA <=20 SENSITIVE Sensitive     AMPICILLIN/SULBACTAM 8 SENSITIVE Sensitive     PIP/TAZO <=4 SENSITIVE Sensitive     * >=100,000 COLONIES/mL ESCHERICHIA COLI         Radiology Studies: No results found.      Scheduled Meds: . apixaban  5 mg Oral BID  . cholecalciferol  1,000 Units Oral Q0600  . docusate sodium  100 mg Oral BID  . insulin aspart  0-9 Units Subcutaneous TID WC  . levothyroxine  200 mcg Oral Q0600  . lidocaine  1 patch Transdermal Q24H  . midodrine  5 mg Oral  TID WC  . multivitamin with minerals  1 tablet Oral Q0600  . pantoprazole  20 mg Oral Daily   Continuous Infusions: . cefTRIAXone (ROCEPHIN)  IV Stopped (04/04/21 1555)  . dextrose 5 % and 0.45% NaCl 75 mL/hr at 04/05/21 1409    Assessment & Plan:   Principal Problem:   Closed fracture of right distal femur ([place])  Active Problems:   Chronic duodenal ulcer   GERD (gastroesophageal reflux disease)   Hypothyroidism   Hypertension   Renal insufficiency   Leukocytosis   Chronic pain   Diabetes mellitus type 2 in nonobese (HCC)   Chronic anemia   Iron deficiency anemia   Closed fracture of right distal femur: Fell while being transferred from 1 chair to another.  X-ray showed comminuted distal femur fracture. status post ORIF on 4/27 Orthopedics following, okay to resume Eliquis  PT OT -SNF Follow-up 2 weeks after discharge for repeat x-ray to monitor  4/30- discussed pain mx with son on phone ,as son did not want her to even have little pain. Discussed with him that  it is fine line about overdosing pt with pain meds with complications of respiratory depression, hypotension, confusion, lethargic, etc. Did express to him we have to use different means to control the pain including pain med, lidocaine patch, and tylenol for now 5/2 continue gabapentin teen  Changed IV Dilaudid dose to 1 mg from 0.5  Discussed with hospice liaison about hospice house but they do not think patient needs criteria at this time will discuss with attending    Leukocytosis-secondary to UTI Trending down Continue with IV Rocephin  E. coli UTI-sensitive to Rocephin Continue with IV Rocephin  Recent history of DVT: Recently diagnosed and was started Eliquis 3 weeks ago.  Continue on Eliquis Hemoglobin improved stable    Chronic normocytic anemia/post op blood loss: Has history of iron deficiency anemia. 4/27-Hg 7.1 , s/p PRBC this am prior to going for surgery Continue to  monitor   Hypertension:now hypotension BP improved.  Will DC IV fluids Continue midodrine  GERD/chronic duodenal ulcer:continue PPI  Diabetes type 2: On sliding scale insulin.  Monitor blood sugars  CKD stage II: Currently kidney function stable and at baseline.  Avoid  Nephrotoxins.  Dementia/debility: Advanced.  Confused at baseline.  She cannot ambulate and is chair bound.  Confused at baseline.  She lives in an ALF.    DVT prophylaxis: eliquis Code Status:DNR Family Communication: Updated son, bill  by phone. Also other family members at bedside  Status is: Inpatient  Remains inpatient appropriate because:Inpatient level of care appropriate due to severity of illness   Dispo: The patient is from: ALF              Anticipated d/c is to: snf with hospice or hospice house.              Patient currently is not medically stable to d/c.   Difficult to place patient No            LOS: 7 days   Time spent: 35 min with >50% on coc     Nolberto Hanlon, MD [facility] Pager [phone]  If 7PM-7AM, please contact night-coverage 04/05/2021, 2:26 PM

## 2021-04-06 DIAGNOSIS — G8929 Other chronic pain: Secondary | ICD-10-CM | POA: Diagnosis not present

## 2021-04-06 DIAGNOSIS — K267 Chronic duodenal ulcer without hemorrhage or perforation: Secondary | ICD-10-CM | POA: Diagnosis not present

## 2021-04-06 DIAGNOSIS — D649 Anemia, unspecified: Secondary | ICD-10-CM | POA: Diagnosis not present

## 2021-04-06 DIAGNOSIS — S72401K Unspecified fracture of lower end of right femur, subsequent encounter for closed fracture with nonunion: Secondary | ICD-10-CM | POA: Diagnosis not present

## 2021-04-06 LAB — GLUCOSE, CAPILLARY
Glucose-Capillary: 81 mg/dL (ref 70–99)
Glucose-Capillary: 84 mg/dL (ref 70–99)
Glucose-Capillary: 156 mg/dL — ABNORMAL HIGH (ref 70–99)

## 2021-04-06 MED ORDER — MORPHINE SULFATE (CONCENTRATE) 10 MG/0.5ML PO SOLN
10.0000 mg | ORAL | Status: DC
  Administered 2021-04-06 – 2021-04-08 (×13): 10 mg via ORAL
  Filled 2021-04-06 (×13): qty 0.5

## 2021-04-06 MED ORDER — HYDROMORPHONE HCL 1 MG/ML IJ SOLN
1.0000 mg | INTRAMUSCULAR | Status: DC | PRN
  Filled 2021-04-06: qty 1

## 2021-04-06 MED ORDER — LIDOCAINE 5 % EX PTCH: 1.0000 | MEDICATED_PATCH | CUTANEOUS | 0 refills | Status: AC

## 2021-04-06 MED ORDER — TRAZODONE HCL 50 MG PO TABS: 25.0000 mg | ORAL_TABLET | Freq: Every evening | ORAL | Status: AC | PRN

## 2021-04-06 NOTE — Discharge Summary (Signed)
Casey Watts:[id number] DOB: [birth year] DOA: 03/29/2021  PCP: Merryl Hacker, No  Admit date: 03/29/2021 Discharge date: 04/06/2021  Admitted From: home Disposition:  Hospice house    Discharge Condition:stable CODE STATUS:DNR  Diet recommendation: dysphagia 2 as tolerated Brief/Interim Summary: Per HPI: Patient is a 87 year old female with history of anemia, chronic duodenal ulcer, chronic pain syndrome, constipation, diabetes type 2, GERD, hypertension, CKD who presented after a fall at her assisted living facility. She was about to be transferred from 1 chair to another and sustained fall during that time. X-rays showed right comminuted distal femur fracture. Orthopedics consulted.  Patient underwent ORIF on 4/27.  She was also found with E. coli UTI which she was treated for.  She continued having pain even with light touch and family decided to discontinue physical therapy.  Due to her poor p.o. intake and nonambulatory at this point and pain family has decided on hospice.  Palliative care was consulted and they felt patient meets criteria for hospice facility for her care as prognosis is less than 2 weeks.  She was started on comfort measures.  Closed fracture of right distal femur: Fell while being transferred from 1 chair to another. X-ray showed comminuted distal femur fracture. status post ORIF on 4/27 Orthopedics following, okay to resume Eliquis  PT OT -SNF Follow-up 2 weeks after discharge for repeat x-ray to monitor - but now patient going to hospice facility .  Leukocytosis-secondary to UTI Trended down. Was tx with iv rocephin    E. coli UTI-sensitive to Rocephin Completed course with IV Rocephin  Recent history of DVT: Recently diagnosed and was started Eliquis 3 weeks ago.  Continue on Eliquis for now.      Chronic normocytic anemia/post op blood loss:Has history of iron deficiency anemia.  s/p PRBC this prior to going for surgery    Hypertension:now  hypotension Received IVF Was placed on midodrine  GERD/chronic duodenal ulcer:continue PPI  Diabetes type 2:On sliding scale insulin. Monitor blood sugars  CKD stage II: Currently kidney function stable and at baseline.AvoidNephrotoxins.  Dementia/debility:Advanced. Confused at baseline.She cannot ambulate and is chair bound. Confused at baseline.She lives in an ALF.      Discharge Diagnoses:  Principal Problem:   Closed fracture of right distal femur ([place]) Active Problems:   Chronic duodenal ulcer   GERD (gastroesophageal reflux disease)   Hypothyroidism   Hypertension   Renal insufficiency   Leukocytosis   Chronic pain   Diabetes mellitus type 2 in nonobese (HCC)   Chronic anemia   Iron deficiency anemia    Discharge Instructions   Allergies as of 04/06/2021   No Known Allergies     Medication List    STOP taking these medications   AQUAPHOR EX   Biofreeze 4 % Gel Generic drug: Menthol (Topical Analgesic)   feeding supplement (PRO-STAT SUGAR FREE 64) Liqd   ferrous sulfate 325 (65 FE) MG tablet   furosemide 20 MG tablet Commonly known as: LASIX   glimepiride 1 MG tablet Commonly known as: AMARYL   lansoprazole 15 MG capsule Commonly known as: PREVACID   levothyroxine 200 MCG tablet Commonly known as: SYNTHROID   multivitamin with minerals Tabs tablet   Remedy Calazime/Olivamine Pste   valsartan-hydrochlorothiazide 80-12.5 MG tablet Commonly known as: DIOVAN-HCT   vitamin C 500 MG tablet Commonly known as: ASCORBIC ACID     TAKE these medications   acetaminophen 650 MG CR tablet Commonly known as: TYLENOL Take 650 mg by mouth See admin instructions. Take  one tablet (650 mg) by mouth twice daily, may also take one tablet (650 mg) daily as needed for pain   cholecalciferol 1000 units tablet Commonly known as: VITAMIN D Take 1,000 Units by mouth daily at 6 (six) AM.   docusate sodium 100 MG capsule Commonly known as:  COLACE Take 1 capsule (100 mg total) by mouth 2 (two) times daily as needed for mild constipation.   Eliquis 5 MG Tabs tablet Generic drug: apixaban Take 5 mg by mouth 2 (two) times daily.   HYDROcodone-acetaminophen 5-325 MG tablet Commonly known as: NORCO/VICODIN Take 1 tablet by mouth every 6 (six) hours as needed for severe pain.   lidocaine 5 % Commonly known as: LIDODERM Place 1 patch onto the skin daily. Remove & Discard patch within 12 hours or as directed by MD Start taking on: Apr 07, 2021   traZODone 50 MG tablet Commonly known as: DESYREL Take 0.5 tablets (25 mg total) by mouth at bedtime as needed for sleep.       Follow-up Information    Haddix, Thomasene Lot, MD. Schedule an appointment as soon as possible for a visit in 2 week(s).   Specialty: Orthopedic Surgery Why: for wound check and repeat x-rays Contact information: [place] [address] [phone]              No Known Allergies  Consultations:  Palliative care, hospice, orthopedics   Procedures/Studies: DG Chest 1 View  Result Date: 03/29/2021 CLINICAL DATA:  Fall. EXAM: CHEST  1 VIEW COMPARISON:  03/20/2018 FINDINGS: Normal cardiac silhouette. Linear atelectasis in the RIGHT midlung. Lungs are hyperinflated. No effusion infiltrate or pneumothorax identified. LEFT shoulder arthroplasty noted. IMPRESSION: 1. No evidence of thoracic trauma. 2. RIGHT lung linear atelectasis. Electronically Signed   By: Suzy Bouchard M.D.   On: 03/29/2021 17:35   DG Pelvis 1-2 Views  Result Date: 03/29/2021 CLINICAL DATA:  87 year old female with fall. EXAM: PELVIS - 1-2 VIEW COMPARISON:  None. FINDINGS: Evaluation is limited due to advanced osteopenia and superimposition of the right hand over the right femur trochanter. No acute fracture identified. There is no dislocation. Moderate left and advanced right hip arthritic changes. Degenerative changes of the lower lumbar spine. The soft tissues are  unremarkable. IMPRESSION: No acute fracture or dislocation. Electronically Signed   By: Anner Crete M.D.   On: 03/29/2021 17:29   CT Head Wo Contrast  Result Date: 03/29/2021 CLINICAL DATA:  Fall.  Head trauma EXAM: CT HEAD WITHOUT CONTRAST TECHNIQUE: Contiguous axial images were obtained from the base of the skull through the vertex without intravenous contrast. COMPARISON:  None. FINDINGS: Brain: No acute intracranial hemorrhage. No focal mass lesion. No CT evidence of acute infarction. No midline shift or mass effect. No hydrocephalus. Basilar cisterns are patent. There are periventricular and subcortical white matter hypodensities. Generalized cortical atrophy. Vascular: No hyperdense vessel or unexpected calcification. Skull: Normal. Negative for fracture or focal lesion. Sinuses/Orbits: Paranasal sinuses and mastoid air cells are clear. Orbits are clear. Other: None. IMPRESSION: 1. No intracranial trauma. 2. Atrophy and white matter microvascular disease Electronically Signed   By: Suzy Bouchard M.D.   On: 03/29/2021 18:01   CT Lumbar Spine Wo Contrast  Result Date: 03/29/2021 CLINICAL DATA:  87 year old post fall when transferring from 1 chair to another. EXAM: CT LUMBAR SPINE WITHOUT CONTRAST TECHNIQUE: Multidetector CT imaging of the lumbar spine was performed without intravenous contrast administration. Multiplanar CT image reconstructions were also generated. COMPARISON:  None.  FINDINGS: Segmentation: 5 lumbar type vertebrae.  S1 is partially lumbarized. Alignment: Broad-based dextroscoliotic curvature. 4 mm anterolisthesis of L5 on S1. Vertebrae: No acute fracture. No vertebral body compression. None fusion posterior elements of S1. No fracture of included sacrum. Bones diffusely under mineralized. Paraspinal and other soft tissues: No acute or traumatic findings. Fatty atrophy of paraspinal musculature. Aortic atherosclerosis. Disc levels: Diffuse degenerative disc disease with disc  space narrowing and endplate spurring. There is multilevel facet hypertrophy. Mild bony canal narrowing at L4-L5. No high-grade canal stenosis. IMPRESSION: 1. No acute fracture of the lumbar spine. 2. Broad-based dextroscoliotic curvature with multilevel degenerative disc disease and facet hypertrophy. Aortic Atherosclerosis (ICD10-I70.0). Electronically Signed   By: Keith Rake M.D.   On: 03/29/2021 17:58   DG Knee Complete 4 Views Right  Result Date: 03/29/2021 CLINICAL DATA:  Fall.  87 year old female EXAM: RIGHT KNEE - COMPLETE 4+ VIEW COMPARISON:  None. FINDINGS: Oblique fracture through the distal femoral metadiaphysis with medial displacement of the distal fracture fragment by 1 bone width. Mild comminution along the fracture plane which enters the proximal metaphysis. IMPRESSION: Oblique comminuted displaced fracture of the distal femur as above. Electronically Signed   By: Suzy Bouchard M.D.   On: 03/29/2021 17:29   DG C-Arm 1-60 Min  Result Date: 03/31/2021 CLINICAL DATA:  Intraoperative imaging.  ORIF. EXAM: RIGHT FEMUR 2 VIEWS; DG C-ARM 1-60 MIN COMPARISON:  Knee radiographs March 29, 2021. FINDINGS: Fluoro time 53 seconds. Reported radiation 3.31 mGy. Seven C-arm fluoroscopic images were obtained intraoperatively and submitted for post operative interpretation. These images demonstrate plate and screw fixation of a comminuted oblique fracture of the distal femoral diaphysis. Improved alignment with approximately quarter width shaft medial displacement of the distal fracture fragment. Please see the performing provider's procedural report for further detail. IMPRESSION: ORIF the femur with plate and screws. Electronically Signed   By: Margaretha Sheffield MD   On: 03/31/2021 11:40   DG FEMUR, MIN 2 VIEWS RIGHT  Result Date: 03/31/2021 CLINICAL DATA:  Status post surgical internal fixation of right distal femoral fracture. EXAM: RIGHT FEMUR 2 VIEWS COMPARISON:  March 29, 2021. FINDINGS:  Status post surgical internal fixation of comminuted distal right femoral shaft fracture. Improved alignment of fracture components is noted. IMPRESSION: Status post open reduction and surgical internal fixation of right distal femoral fracture. Electronically Signed   By: Marijo Conception M.D.   On: 03/31/2021 14:13   DG FEMUR, MIN 2 VIEWS RIGHT  Result Date: 03/31/2021 CLINICAL DATA:  Intraoperative imaging.  ORIF. EXAM: RIGHT FEMUR 2 VIEWS; DG C-ARM 1-60 MIN COMPARISON:  Knee radiographs March 29, 2021. FINDINGS: Fluoro time 53 seconds. Reported radiation 3.31 mGy. Seven C-arm fluoroscopic images were obtained intraoperatively and submitted for post operative interpretation. These images demonstrate plate and screw fixation of a comminuted oblique fracture of the distal femoral diaphysis. Improved alignment with approximately quarter width shaft medial displacement of the distal fracture fragment. Please see the performing provider's procedural report for further detail. IMPRESSION: ORIF the femur with plate and screws. Electronically Signed   By: Margaretha Sheffield MD   On: 03/31/2021 11:40   DG FEMUR, MIN 2 VIEWS RIGHT  Result Date: 03/29/2021 CLINICAL DATA:  87 year old female with fall and trauma to the right lower extremity. EXAM: RIGHT FEMUR 2 VIEWS COMPARISON:  Pelvic radiograph dated 03/29/2021. FINDINGS: There is a comminuted oblique fracture of the distal femoral diaphysis. There is medial and anterior angulation of the major distal fracture fragment. No dislocation.  There is advanced osteopenia with severe degenerative changes of the right knee. There is soft tissue swelling of the anterior soft tissues above the knee with probable small suprapatellar effusion. Vascular calcification. IMPRESSION: Comminuted, displaced and angulated, oblique fracture of the distal femoral diaphysis. Electronically Signed   By: Anner Crete M.D.   On: 03/29/2021 17:32   VAS [place] LOWER EXTREMITY VENOUS (DVT)  (ONLY [facility] & [facility] 7a-7p)  Result Date: 03/08/2021  Lower Venous DVT Study Indications: pain, pitting edema, and weeping of right lower extremity.  Risk Factors: Patient is wheelchair bound. Limitations: Patient's positioning and significant calf edema. Comparison Study: No prior Right LE study on file Performing Technologist: Sharion Dove RVS  Examination Guidelines: A complete evaluation includes B-mode imaging, spectral Doppler, color Doppler, and power Doppler as needed of all accessible portions of each vessel. Bilateral testing is considered an integral part of a complete examination. Limited examinations for reoccurring indications may be performed as noted. The reflux portion of the exam is performed with the patient in reverse Trendelenburg.  +---------+---------------+---------+-----------+----------+-------------------+ RIGHT    CompressibilityPhasicitySpontaneityPropertiesThrombus Aging      +---------+---------------+---------+-----------+----------+-------------------+ CFV      Full                                         pulsatile           +---------+---------------+---------+-----------+----------+-------------------+ SFJ      Full                                                             +---------+---------------+---------+-----------+----------+-------------------+ FV Prox  Full                                                             +---------+---------------+---------+-----------+----------+-------------------+ FV Mid   Full                                                             +---------+---------------+---------+-----------+----------+-------------------+ FV DistalFull                                                             +---------+---------------+---------+-----------+----------+-------------------+ PFV      Full                                                              +---------+---------------+---------+-----------+----------+-------------------+ POP      Full  pulsatile           +---------+---------------+---------+-----------+----------+-------------------+ PTV      None                                                             +---------+---------------+---------+-----------+----------+-------------------+ PERO                                                  Not well visualized +---------+---------------+---------+-----------+----------+-------------------+   Left Technical Findings: Left leg not evaluated.   Summary: RIGHT: - Findings consistent with acute deep vein thrombosis involving the right posterior tibial veins. significant interstitial edema noted throughout calf   *See table(s) above for measurements and observations. Electronically signed by Ruta Hinds MD on 03/08/2021 at 3:04:11 PM.    Final        Subjective: C/o pain. Minimal po intake  Discharge Exam: Vitals:   04/06/21 0448 04/06/21 0749  BP: 113/65 (!) 97/57  Pulse:  76  Resp: 18 16  Temp: 99.5 F (37.5 C) 97.7 F (36.5 C)  SpO2: 93% 99%   Vitals:   04/05/21 1439 04/05/21 2027 04/06/21 0448 04/06/21 0749  BP: (!) 101/55 (!) 125/107 113/65 (!) 97/57  Pulse: (!) 105 (!) 59  76  Resp:  18 18 16   Temp:  99.1 F (37.3 C) 99.5 F (37.5 C) 97.7 F (36.5 C)  TempSrc:  Oral Oral Oral  SpO2:  98% 93% 99%  Weight:      Height:        General: nad, keeps eyes closed.  Cardiovascular: RRR, S1/S2  no gallops Respiratory: CTA bilaterally, no wheezing, no rhonchi Abdominal: Soft, NT, ND, bowel sounds + Extremities: no edema    The results of significant diagnostics from this hospitalization (including imaging, microbiology, ancillary and laboratory) are listed below for reference.     Microbiology: Recent Results (from the past 240 hour(s))  Resp Panel by RT-PCR (Flu A&B, Covid) Nasopharyngeal Swab     Status:  None   Collection Time: 03/29/21  5:49 PM   Specimen: Nasopharyngeal Swab; Nasopharyngeal(NP) swabs in vial transport medium  Result Value Ref Range Status   SARS Coronavirus 2 by RT PCR NEGATIVE NEGATIVE Final    Comment: (NOTE) SARS-CoV-2 target nucleic acids are NOT DETECTED.  The SARS-CoV-2 RNA is generally detectable in upper respiratory specimens during the acute phase of infection. The lowest concentration of SARS-CoV-2 viral copies this assay can detect is 138 copies/mL. A negative result does not preclude SARS-Cov-2 infection and should not be used as the sole basis for treatment or other patient management decisions. A negative result may occur with  improper specimen collection/handling, submission of specimen other than nasopharyngeal swab, presence of viral mutation(s) within the areas targeted by this assay, and inadequate number of viral copies(<138 copies/mL). A negative result must be combined with clinical observations, patient history, and epidemiological information. The expected result is Negative.  Fact Sheet for Patients:  EntrepreneurPulse.com.au  Fact Sheet for Healthcare Providers:  IncredibleEmployment.be  This test is no t yet approved or cleared by the [place] FDA and  has been authorized for detection and/or diagnosis of SARS-CoV-2 by FDA under  an Emergency Use Authorization (EUA). This EUA will remain  in effect (meaning this test can be used) for the duration of the COVID-19 declaration under Section 564(b)(1) of the Act, 21 U.S.C.section 360bbb-3(b)(1), unless the authorization is terminated  or revoked sooner.       Influenza A by PCR NEGATIVE NEGATIVE Final   Influenza B by PCR NEGATIVE NEGATIVE Final    Comment: (NOTE) The Xpert Xpress SARS-CoV-2/FLU/RSV plus assay is intended as an aid in the diagnosis of influenza from Nasopharyngeal swab specimens and should not be used as a sole basis for  treatment. Nasal washings and aspirates are unacceptable for Xpert Xpress SARS-CoV-2/FLU/RSV testing.  Fact Sheet for Patients: EntrepreneurPulse.com.au  Fact Sheet for Healthcare Providers: IncredibleEmployment.be  This test is not yet approved or cleared by the [place] FDA and has been authorized for detection and/or diagnosis of SARS-CoV-2 by FDA under an Emergency Use Authorization (EUA). This EUA will remain in effect (meaning this test can be used) for the duration of the COVID-19 declaration under Section 564(b)(1) of the Act, 21 U.S.C. section 360bbb-3(b)(1), unless the authorization is terminated or revoked.  Performed at [facility], [place] [address] [place], [place] [address]   Surgical pcr screen     Status: None   Collection Time: 03/30/21 12:50 AM   Specimen: Nasal Mucosa; Nasal Swab  Result Value Ref Range Status   MRSA, PCR NEGATIVE NEGATIVE Final   Staphylococcus aureus NEGATIVE NEGATIVE Final    Comment: (NOTE) The Xpert SA Assay (FDA approved for NASAL specimens in patients 34 years of age and older), is one component of a comprehensive surveillance program. It is not intended to diagnose infection nor to guide or monitor treatment. Performed at [facility], [place] [address], [place], [place] [address]   Culture, Urine     Status: Abnormal   Collection Time: 04/01/21  1:41 PM   Specimen: Urine, Catheterized  Result Value Ref Range Status   Specimen Description URINE, CATHETERIZED  Final   Special Requests   Final    NONE Performed at [facility], [address] [place], [place] [address]    Culture >=100,000 COLONIES/mL ESCHERICHIA COLI (A)  Final   Report Status 04/03/2021 FINAL  Final   Organism ID, Bacteria ESCHERICHIA COLI (A)  Final      Susceptibility   Escherichia coli - MIC*    AMPICILLIN >=32 RESISTANT Resistant     CEFAZOLIN 8 SENSITIVE Sensitive     CEFEPIME <=0.12 SENSITIVE  Sensitive     CEFTRIAXONE <=0.25 SENSITIVE Sensitive     CIPROFLOXACIN >=4 RESISTANT Resistant     GENTAMICIN <=1 SENSITIVE Sensitive     IMIPENEM <=0.25 SENSITIVE Sensitive     NITROFURANTOIN <=16 SENSITIVE Sensitive     TRIMETH/SULFA <=20 SENSITIVE Sensitive     AMPICILLIN/SULBACTAM 8 SENSITIVE Sensitive     PIP/TAZO <=4 SENSITIVE Sensitive     * >=100,000 COLONIES/mL ESCHERICHIA COLI     Labs: BNP (last 3 results) No results for input(s): BNP in the last 8760 hours. Basic Metabolic Panel: Recent Labs  Lab 03/31/21 0251 03/31/21 1035 04/01/21 0903 04/02/21 0012 04/03/21 0212  NA 136 138 137 137 136  K 3.9 3.9 4.9 4.8 4.8  CL 101 99 104 105 106  CO2 28  --  25 27 23   GLUCOSE 82 102* 105* 86 145*  BUN 24* 24* 28* 31* 30*  CREATININE 1.10* 1.00 1.10* 1.11* 1.00  CALCIUM 7.8*  --  7.6* 7.8* 7.9*   Liver Function Tests: No results for input(s): AST, ALT, ALKPHOS, BILITOT, PROT, ALBUMIN in the last 168 hours. No results for input(s): LIPASE, AMYLASE in the last 168 hours. No results for input(s): AMMONIA in the last 168 hours. CBC: Recent Labs  Lab 03/31/21 0251 03/31/21 1035 04/01/21 0857 04/02/21 0012 04/04/21 0104  WBC 11.9*  --  16.0* 14.4* 12.7*  NEUTROABS 8.0*  --   --   --   --   HGB 7.1* 8.2* 8.6* 8.7* 10.9*  HCT 21.4* 24.0* 27.0* 27.4* 33.6*  MCV 100.9*  --  98.9 100.0 99.4  PLT 226  --  282 283 366   Cardiac Enzymes: No results for input(s): CKTOTAL, CKMB, CKMBINDEX, TROPONINI in the last 168 hours. BNP: Invalid input(s): POCBNP CBG: Recent Labs  Lab 04/05/21 1540 04/05/21 2031 04/06/21 0647 04/06/21 0751 04/06/21 1145  GLUCAP 153* 111* 81 84 156*   D-Dimer No results for input(s): DDIMER in the last 72 hours. Hgb A1c No results for input(s): HGBA1C in the last 72 hours. Lipid Profile No results for input(s): CHOL, HDL, LDLCALC, TRIG, CHOLHDL, LDLDIRECT in the last 72 hours. Thyroid function studies No results for input(s): TSH, T4TOTAL,  T3FREE, THYROIDAB in the last 72 hours.  Invalid input(s): FREET3 Anemia work up No results for input(s): VITAMINB12, FOLATE, FERRITIN, TIBC, IRON, RETICCTPCT in the last 72 hours. Urinalysis    Component Value Date/Time   COLORURINE YELLOW (A) 04/01/2021 1149   APPEARANCEUR TURBID (A) 04/01/2021 1149   LABSPEC 1.025 04/01/2021 1149   PHURINE 5.0 04/01/2021 1149   GLUCOSEU NEGATIVE 04/01/2021 1149   HGBUR SMALL (A) 04/01/2021 1149   BILIRUBINUR NEGATIVE 04/01/2021 1149   KETONESUR 5 (A) 04/01/2021 1149   PROTEINUR 100 (A) 04/01/2021 1149   NITRITE NEGATIVE 04/01/2021 1149   LEUKOCYTESUR MODERATE (A) 04/01/2021 1149   Sepsis Labs Invalid input(s): PROCALCITONIN,  WBC,  LACTICIDVEN Microbiology Recent Results (from the past 240 hour(s))  Resp Panel by RT-PCR (Flu A&B, Covid) Nasopharyngeal Swab     Status: None   Collection Time: 03/29/21  5:49 PM   Specimen: Nasopharyngeal Swab; Nasopharyngeal(NP) swabs in vial transport medium  Result Value Ref Range Status   SARS Coronavirus 2 by RT PCR NEGATIVE NEGATIVE Final    Comment: (NOTE) SARS-CoV-2 target nucleic acids are NOT DETECTED.  The SARS-CoV-2 RNA is generally detectable in upper respiratory specimens during the acute phase of infection. The lowest concentration of SARS-CoV-2 viral copies this assay can detect is 138 copies/mL. A negative result does not preclude SARS-Cov-2 infection and should not be used as the sole basis for treatment or other patient management decisions. A negative result may occur with  improper specimen collection/handling, submission of specimen other than nasopharyngeal swab, presence of viral mutation(s) within the areas targeted by this assay, and inadequate number of viral copies(<138 copies/mL). A negative result must be combined with clinical observations, patient history, and epidemiological information. The expected result is Negative.  Fact Sheet for Patients:   BloggerCourse.comhttps://www.fda.gov/media/152166/download  Fact Sheet for Healthcare Providers:  SeriousBroker.ithttps://www.fda.gov/media/152162/download  This test is no t yet approved or cleared by the [place] FDA and  has been authorized for detection and/or diagnosis of SARS-CoV-2 by FDA under an Emergency Use Authorization (EUA). This EUA will remain  in effect (meaning this test can be used) for the duration of the COVID-19 declaration under Section 564(b)(1) of the Act, 21 U.S.C.section 360bbb-3(b)(1), unless the authorization is terminated  or revoked sooner.  Influenza A by PCR NEGATIVE NEGATIVE Final   Influenza B by PCR NEGATIVE NEGATIVE Final    Comment: (NOTE) The Xpert Xpress SARS-CoV-2/FLU/RSV plus assay is intended as an aid in the diagnosis of influenza from Nasopharyngeal swab specimens and should not be used as a sole basis for treatment. Nasal washings and aspirates are unacceptable for Xpert Xpress SARS-CoV-2/FLU/RSV testing.  Fact Sheet for Patients: EntrepreneurPulse.com.au  Fact Sheet for Healthcare Providers: IncredibleEmployment.be  This test is not yet approved or cleared by the [place] FDA and has been authorized for detection and/or diagnosis of SARS-CoV-2 by FDA under an Emergency Use Authorization (EUA). This EUA will remain in effect (meaning this test can be used) for the duration of the COVID-19 declaration under Section 564(b)(1) of the Act, 21 U.S.C. section 360bbb-3(b)(1), unless the authorization is terminated or revoked.  Performed at [facility], [place] [address], [place], [place] [address]   Surgical pcr screen     Status: None   Collection Time: 03/30/21 12:50 AM   Specimen: Nasal Mucosa; Nasal Swab  Result Value Ref Range Status   MRSA, PCR NEGATIVE NEGATIVE Final   Staphylococcus aureus NEGATIVE NEGATIVE Final    Comment: (NOTE) The Xpert SA Assay (FDA approved for NASAL specimens in patients  70 years of age and older), is one component of a comprehensive surveillance program. It is not intended to diagnose infection nor to guide or monitor treatment. Performed at [facility], [place] [address] [place], [place] [address]   Culture, Urine     Status: Abnormal   Collection Time: 04/01/21  1:41 PM   Specimen: Urine, Catheterized  Result Value Ref Range Status   Specimen Description URINE, CATHETERIZED  Final   Special Requests   Final    NONE Performed at [facility], [address], [place], [place] [address]    Culture >=100,000 COLONIES/mL ESCHERICHIA COLI (A)  Final   Report Status 04/03/2021 FINAL  Final   Organism ID, Bacteria ESCHERICHIA COLI (A)  Final      Susceptibility   Escherichia coli - MIC*    AMPICILLIN >=32 RESISTANT Resistant     CEFAZOLIN 8 SENSITIVE Sensitive     CEFEPIME <=0.12 SENSITIVE Sensitive     CEFTRIAXONE <=0.25 SENSITIVE Sensitive     CIPROFLOXACIN >=4 RESISTANT Resistant     GENTAMICIN <=1 SENSITIVE Sensitive     IMIPENEM <=0.25 SENSITIVE Sensitive     NITROFURANTOIN <=16 SENSITIVE Sensitive     TRIMETH/SULFA <=20 SENSITIVE Sensitive     AMPICILLIN/SULBACTAM 8 SENSITIVE Sensitive     PIP/TAZO <=4 SENSITIVE Sensitive     * >=100,000 COLONIES/mL ESCHERICHIA COLI     Time coordinating discharge: Over 30 minutes  SIGNED:   Nolberto Hanlon, MD  [facility] 04/06/2021, 3:20 PM Pager   If 7PM-7AM, please contact night-coverage [email or web address] Password [email or web address]

## 2021-04-06 NOTE — Progress Notes (Signed)
Palliative Medicine RN Note: Consult order noted for "needs hospice." TOC is already working on this, and has spoken to [facility], [facility] to give them the referral. Family is aware of and in agreement with hospice.  There is no role for PMT at this time, as GOC are clear and d/c plan for hospice is in place. Our team will not see Ms Tindol at this time.  Marjie Skiff Leyan Branden, RN, BSN, [facility] Palliative Medicine Team 04/06/2021 9:31 AM Office [phone]

## 2021-04-06 NOTE — Consult Note (Signed)
Full consult note to follow. Patient seen and evaluated. She is grimacing, keeps her eyes closed through conversation and only provides simple answers, cannot tolerate any movement-son says she is very stoic at baseline. Current pain is not well controlled, she is not eating, it is difficult to get her to swallow pills. She appears to be approaching EOL. My recommendation will be for a hospice facility for her care. Prognosis is <2 weeks.  Will add scheduled Roxanol and leave prn hydromorphone.  Comfort measures Only- discussed with son- he is in agreement.  Lane Hacker, DO Palliative Medicine

## 2021-04-06 NOTE — Progress Notes (Signed)
Speech Language Pathology Discharge Patient Details Name: Casey Watts MRN: [id number] DOB: 1924/03/12 Today's Date: 04/06/2021 Time: 1422-     Patient discharged from SLP services secondary to : pt politely declined po's. Palliative care MD arrived and plan for possible admit to hospice/[facility].   Please see latest therapy progress note for current level of functioning and progress toward goals.    Progress and discharge plan discussed with patient and/or caregiver: yes  GO     Houston Siren 04/06/2021, 2:38 PM

## 2021-04-06 NOTE — Progress Notes (Addendum)
[facility])   Addendum:  Based on Dr. Delanna Watts referral and her assessment Casey Watts has been deemed eligible for [facility] by [facility] MD.  Liaison team will continue to follow.  No beds available at this time.   Chart and pt information has been reviewed by Casey Watts physician.  Hospice eligibility confirmed.  [facility]atts liaison spoke with son in law Casey Watts at bedside and with Casey Watts, son and HCPOA, by phone to continue discussion related to hospice philosophy and services and to answer any questions at this time.  Both verbalized understanding of information given.    Per discussion the plan is for pt to be evaluated by Casey Watts to confirm her return there with hospice support.  Once that is confirmed [facility] liaison will order a Watts bed and overbed table.   Pease send signed and completed DNR home with pt/family.  Please provide prescriptions at discharge as needed to ensure ongoing symptom management until pt can be admitted home with hospice.    [facility] information and contact numbers given to Casey Watts.  Above information shared with Casey Watts Manager.  Please call with any questions or concerns.  Thank you for the opportunity to participate in this pt's care.  Casey Watts, BSN, RN [place] [facility] [phone] (24h on call)           Note Details  Casey Watts, Casey Watts Time 04/06/2021 1:21 PM  Author Type (none) Status Signed  Last Editor Casey Watts Service (none)  [place] # [id number] Admit Date 04/02/2021

## 2021-04-06 NOTE — Plan of Care (Signed)
Joey- Son-in-law at bedside.  Problem: Health Behavior/Discharge Planning: Goal: Ability to manage health-related needs will improve Outcome: Progressing   Problem: Clinical Measurements: Goal: Ability to maintain clinical measurements within normal limits will improve Outcome: Progressing   Problem: Activity: Goal: Risk for activity intolerance will decrease Outcome: Progressing   Problem: Nutrition: Goal: Adequate nutrition will be maintained Outcome: Progressing   Problem: Coping: Goal: Level of anxiety will decrease Outcome: Progressing   Problem: Elimination: Goal: Will not experience complications related to bowel motility Outcome: Progressing   Problem: Pain Managment: Goal: General experience of comfort will improve Outcome: Progressing   Problem: Safety: Goal: Ability to remain free from injury will improve Outcome: Progressing   Problem: Skin Integrity: Goal: Risk for impaired skin integrity will decrease Outcome: Progressing

## 2021-04-07 DIAGNOSIS — S72401K Unspecified fracture of lower end of right femur, subsequent encounter for closed fracture with nonunion: Secondary | ICD-10-CM | POA: Diagnosis not present

## 2021-04-07 NOTE — Progress Notes (Signed)
Manufacturing engineer [facility]  Ms. Ke is approved for residential hospice at [facility], however we do not have a bed to offer today.  [facility] will follow up with family and hospital once our bed status changes.  Venia Carbon RN, BSN, [facility] Liaison

## 2021-04-07 NOTE — Care Management (Signed)
Case Manager spoke with Coralyn Mark from [facility] concerning patient returning to their facility. CM explained that presidentalliative MD, Dr. Hilma Favors recommends patient go to residential hospice facility for EOL care, patient's son is in agreement. CM spoke with Venia Carbon with [facility] and bed may be available on tomorrow. TOC team will continue to monitor.   Ricki Miller, RN BSN Case Manager [phone]

## 2021-04-07 NOTE — Hospital Course (Signed)
Per H&P and prior progress notes: "Patient is a 87 year old female with history of anemia, chronic duodenal ulcer, chronic pain syndrome, constipation, diabetes type 2, GERD, hypertension, CKD who presented after a fall at her assisted living facility.  She was about to be transferred from 1 chair to another and sustained fall during that time.  X-rays showed right comminuted distal femur fracture.  Orthopedics consulted.  Patient underwent ORIF on 4/27.  She was also found with E. coli UTI which she was treated for.  She continued having pain even with light touch and family decided to discontinue physical therapy.  Due to her poor p.o. intake and nonambulatory at this point and pain family has decided on hospice.  Palliative care was consulted and they felt patient meets criteria for hospice facility for her care as prognosis is less than 2 weeks.  She was started on comfort measures."

## 2021-04-07 NOTE — Progress Notes (Signed)
PROGRESS NOTE    Casey Watts   NLZ:[id number]  DOB: 1924/07/04  PCP: Pcp, No    DOA: 03/29/2021 LOS: 9   Brief Narrative   Per H&P and prior progress notes: "Patient is a 87 year old female with history of anemia, chronic duodenal ulcer, chronic pain syndrome, constipation, diabetes type 2, GERD, hypertension, CKD who presented after a fall at her assisted living facility.  She was about to be transferred from 1 chair to another and sustained fall during that time.  X-rays showed right comminuted distal femur fracture.  Orthopedics consulted.  Patient underwent ORIF on 4/27.  She was also found with E. coli UTI which she was treated for.  She continued having pain even with light touch and family decided to discontinue physical therapy.  Due to her poor p.o. intake and nonambulatory at this point and pain family has decided on hospice.  Palliative care was consulted and they felt patient meets criteria for hospice facility for her care as prognosis is less than 2 weeks.  She was started on comfort measures."    Assessment & Plan   Principal Problem:   Closed fracture of right distal femur ([place]) Active Problems:   Chronic duodenal ulcer   GERD (gastroesophageal reflux disease)   Hypothyroidism   Hypertension   Renal insufficiency   Leukocytosis   Chronic pain   Diabetes mellitus type 2 in nonobese (HCC)   Chronic anemia   Iron deficiency anemia   Closed fracture of right distal femur: Fell while being transferred from 1 chair to another. X-ray showed comminuted distal femur fracture. status post ORIF on 4/27 Orthopedics following, okay to resume Eliquis  PT OT -SNF Ortho rec 2 weeks after dc for repeat x-ray, now hospice dispo Awaiting discharge to hospice house.  Leukocytosis-secondary to UTI Trended down.  Completed abx.  E. coli UTI-sensitive to Rocephin Completed course with IV Rocephin  Recent history of DVT: Recently diagnosed and was started Eliquis 3  weeks ago. Continue on Eliquis for now.   Chronic normocytic anemia/post op blood loss:Has history of iron deficiency anemia. s/p PRBC prior to surgery. Monitor CBC  Hypertension:now hypotension Hypotension better with IVF Started on midodrine  GERD/chronic duodenal ulcer:continue PPI  Diabetes type 2:On sliding scale insulin. Monitor blood sugars  CKD stage II: Currently kidney function stable and at baseline.AvoidNephrotoxins.  Dementia/debility:Advanced. Stable baseline confusion.  Non-ambulatory at baseline, chair bound. Was living at ALF prior to admission.   Patient BMI: Body mass index is 21.54 kg/m.   DVT prophylaxis:    Diet:  Diet Orders (From admission, onward)    Start     Ordered   04/03/21 0931  DIET DYS 2 Room service appropriate? Yes; Fluid consistency: Thin  Diet effective now       Question Answer Comment  Room service appropriate? Yes   Fluid consistency: Thin      04/03/21 0930            Code Status: DNR    Subjective 04/07/21    Pt sitting up in bed.  Says having only a little pain right now.  Denies other complaints.   Disposition Plan & Communication   Status is: Inpatient  Inpatient status appropriate as patient is on comfort care and awaiting hospice bed, d/c when bed available.  Dispo: The patient is from: ALF              Anticipated d/c is to: hospice  Patient currently not stable for d/c   Difficult to place patient no     Consults, Procedures, Significant Events   Consultants:   Orthopedics  Procedures:   4/27 - ORIF of right supracondylar femur fracture  Antimicrobials:  Anti-infectives (From admission, onward)   Start     Dose/Rate Route Frequency Ordered Stop   04/02/21 1530  cefTRIAXone (ROCEPHIN) 1 g in sodium chloride 0.9 % 100 mL IVPB  Status:  Discontinued        1 g 200 mL/hr over 30 Minutes Intravenous Every 24 hours 04/02/21 1440 04/06/21 1504   03/31/21 2200  ceFAZolin  (ANCEF) IVPB 2g/100 mL premix        2 g 200 mL/hr over 30 Minutes Intravenous Every 12 hours 03/31/21 1316 04/01/21 0836   03/31/21 1038  vancomycin (VANCOCIN) powder  Status:  Discontinued          As needed 03/31/21 1038 03/31/21 1116   03/31/21 0945  ceFAZolin (ANCEF) IVPB 2g/100 mL premix        2 g 200 mL/hr over 30 Minutes Intravenous On call to O.R. 03/30/21 1556 03/31/21 1015        Micro    Objective   Vitals:   04/06/21 0749 04/06/21 1500 04/06/21 2057 04/07/21 0535  BP: (!) 97/57 (!) 107/54 (!) 107/48 (!) 108/53  Pulse: 76 (!) 54 (!) 59 (!) 111  Resp: 16 17 16 16   Temp: 97.7 F (36.5 C) 98.8 F (37.1 C) 97.6 F (36.4 C) 98.2 F (36.8 C)  TempSrc: Oral Oral    SpO2: 99% 97% 100% 90%  Weight:      Height:        Intake/Output Summary (Last 24 hours) at 04/07/2021 1720 Last data filed at 04/07/2021 1420 Gross per 24 hour  Intake 50 ml  Output 600 ml  Net -550 ml   Filed Weights   03/29/21 2300 03/31/21 0906  Weight: 51.7 kg 51.7 kg    Physical Exam:  General exam: awake, alert, no acute distress Respiratory system: CTAB, normal respiratory effort. Cardiovascular system: normal S1/S2, RRR   Gastrointestinal system: soft, NT, ND Central nervous system: no gross focal neurologic deficits, normal speech Extremities: no edema, normal tone   Labs   Data Reviewed: I have personally reviewed following labs and imaging studies  CBC: Recent Labs  Lab 04/01/21 0857 04/02/21 0012 04/04/21 0104  WBC 16.0* 14.4* 12.7*  HGB 8.6* 8.7* 10.9*  HCT 27.0* 27.4* 33.6*  MCV 98.9 100.0 99.4  PLT 282 283 [id number]   Basic Metabolic Panel: Recent Labs  Lab 04/01/21 0903 04/02/21 0012 04/03/21 0212  NA 137 137 136  K 4.9 4.8 4.8  CL 104 105 106  CO2 25 27 23   GLUCOSE 105* 86 145*  BUN 28* 31* 30*  CREATININE 1.10* 1.11* 1.00  CALCIUM 7.6* 7.8* 7.9*   GFR: Estimated Creatinine Clearance: 25.4 mL/min (by C-G formula based on SCr of 1 mg/dL). Liver Function  Tests: No results for input(s): AST, ALT, ALKPHOS, BILITOT, PROT, ALBUMIN in the last 168 hours. No results for input(s): LIPASE, AMYLASE in the last 168 hours. No results for input(s): AMMONIA in the last 168 hours. Coagulation Profile: No results for input(s): INR, PROTIME in the last 168 hours. Cardiac Enzymes: No results for input(s): CKTOTAL, CKMB, CKMBINDEX, TROPONINI in the last 168 hours. BNP (last 3 results) No results for input(s): PROBNP in the last 8760 hours. HbA1C: No results for input(s): HGBA1C in the last 72  hours. CBG: Recent Labs  Lab 04/05/21 1540 04/05/21 2031 04/06/21 0647 04/06/21 0751 04/06/21 1145  GLUCAP 153* 111* 81 84 156*   Lipid Profile: No results for input(s): CHOL, HDL, LDLCALC, TRIG, CHOLHDL, LDLDIRECT in the last 72 hours. Thyroid Function Tests: No results for input(s): TSH, T4TOTAL, FREET4, T3FREE, THYROIDAB in the last 72 hours. Anemia Panel: No results for input(s): VITAMINB12, FOLATE, FERRITIN, TIBC, IRON, RETICCTPCT in the last 72 hours. Sepsis Labs: No results for input(s): PROCALCITON, LATICACIDVEN in the last 168 hours.  Recent Results (from the past 240 hour(s))  Resp Panel by RT-PCR (Flu A&B, Covid) Nasopharyngeal Swab     Status: None   Collection Time: 03/29/21  5:49 PM   Specimen: Nasopharyngeal Swab; Nasopharyngeal(NP) swabs in vial transport medium  Result Value Ref Range Status   SARS Coronavirus 2 by RT PCR NEGATIVE NEGATIVE Final    Comment: (NOTE) SARS-CoV-2 target nucleic acids are NOT DETECTED.  The SARS-CoV-2 RNA is generally detectable in upper respiratory specimens during the acute phase of infection. The lowest concentration of SARS-CoV-2 viral copies this assay can detect is 138 copies/mL. A negative result does not preclude SARS-Cov-2 infection and should not be used as the sole basis for treatment or other patient management decisions. A negative result may occur with  improper specimen collection/handling,  submission of specimen other than nasopharyngeal swab, presence of viral mutation(s) within the areas targeted by this assay, and inadequate number of viral copies(<138 copies/mL). A negative result must be combined with clinical observations, patient history, and epidemiological information. The expected result is Negative.  Fact Sheet for Patients:  EntrepreneurPulse.com.au  Fact Sheet for Healthcare Providers:  IncredibleEmployment.be  This test is no t yet approved or cleared by the [place] FDA and  has been authorized for detection and/or diagnosis of SARS-CoV-2 by FDA under an Emergency Use Authorization (EUA). This EUA will remain  in effect (meaning this test can be used) for the duration of the COVID-19 declaration under Section 564(b)(1) of the Act, 21 U.S.C.section 360bbb-3(b)(1), unless the authorization is terminated  or revoked sooner.       Influenza A by PCR NEGATIVE NEGATIVE Final   Influenza B by PCR NEGATIVE NEGATIVE Final    Comment: (NOTE) The Xpert Xpress SARS-CoV-2/FLU/RSV plus assay is intended as an aid in the diagnosis of influenza from Nasopharyngeal swab specimens and should not be used as a sole basis for treatment. Nasal washings and aspirates are unacceptable for Xpert Xpress SARS-CoV-2/FLU/RSV testing.  Fact Sheet for Patients: EntrepreneurPulse.com.au  Fact Sheet for Healthcare Providers: IncredibleEmployment.be  This test is not yet approved or cleared by the [place] FDA and has been authorized for detection and/or diagnosis of SARS-CoV-2 by FDA under an Emergency Use Authorization (EUA). This EUA will remain in effect (meaning this test can be used) for the duration of the COVID-19 declaration under Section 564(b)(1) of the Act, 21 U.S.C. section 360bbb-3(b)(1), unless the authorization is terminated or revoked.  Performed at [facility], [place] [address], [place],  [address]   Surgical pcr screen     Status: None   Collection Time: 03/30/21 12:50 AM   Specimen: Nasal Mucosa; Nasal Swab  Result Value Ref Range Status   MRSA, PCR NEGATIVE NEGATIVE Final   Staphylococcus aureus NEGATIVE NEGATIVE Final    Comment: (NOTE) The Xpert SA Assay (FDA approved for NASAL specimens in patients 79 years of age and older), is one component of a comprehensive surveillance program. It is  not intended to diagnose infection nor to guide or monitor treatment. Performed at [facility], [place] [address] [place], [place] [address]   Culture, Urine     Status: Abnormal   Collection Time: 04/01/21  1:41 PM   Specimen: Urine, Catheterized  Result Value Ref Range Status   Specimen Description URINE, CATHETERIZED  Final   Special Requests   Final    NONE Performed at [facility], [address] [place], [place] [address]    Culture >=100,000 COLONIES/mL ESCHERICHIA COLI (A)  Final   Report Status 04/03/2021 FINAL  Final   Organism ID, Bacteria ESCHERICHIA COLI (A)  Final      Susceptibility   Escherichia coli - MIC*    AMPICILLIN >=32 RESISTANT Resistant     CEFAZOLIN 8 SENSITIVE Sensitive     CEFEPIME <=0.12 SENSITIVE Sensitive     CEFTRIAXONE <=0.25 SENSITIVE Sensitive     CIPROFLOXACIN >=4 RESISTANT Resistant     GENTAMICIN <=1 SENSITIVE Sensitive     IMIPENEM <=0.25 SENSITIVE Sensitive     NITROFURANTOIN <=16 SENSITIVE Sensitive     TRIMETH/SULFA <=20 SENSITIVE Sensitive     AMPICILLIN/SULBACTAM 8 SENSITIVE Sensitive     PIP/TAZO <=4 SENSITIVE Sensitive     * >=100,000 COLONIES/mL ESCHERICHIA COLI      Imaging Studies   No results found.   Medications   Scheduled Meds: . docusate sodium  100 mg Oral BID  . lidocaine  1 patch Transdermal Q24H  . morphine CONCENTRATE  10 mg Oral Q4H  . pantoprazole  20 mg Oral Daily   Continuous Infusions:     LOS: 9 days    Time spent: 25 minutes with > 50% spent in  coordination of care and at bedside    Ezekiel Slocumb, DO [facility]  04/07/2021, 5:20 PM      If 7PM-7AM, please contact night-coverage. How to contact the [facility] Attending or Consulting provider [place] or covering provider during after hours [place], for this patient?    1. Check the care team in [facility] and look for a) attending/consulting TRH provider listed and b) the [facility] team listed 2. Log into [email or web address] and use [place]'s universal password to access. If you do not have the password, please contact the hospital operator. 3. Locate the [facility] provider you are looking for under [facility] and page to a number that you can be directly reached. 4. If you still have difficulty reaching the provider, please page the [facility] (Director on Call) for the Hospitalists listed on amion for assistance.

## 2021-04-08 LAB — RESP PANEL BY RT-PCR (FLU A&B, COVID) ARPGX2
Influenza A by PCR: NEGATIVE
Influenza B by PCR: NEGATIVE
SARS Coronavirus 2 by RT PCR: NEGATIVE

## 2021-04-08 MED ORDER — SCOPOLAMINE 1 MG/3DAYS TD PT72
1.0000 | MEDICATED_PATCH | TRANSDERMAL | Status: DC
  Administered 2021-04-08: 1.5 mg via TRANSDERMAL
  Filled 2021-04-08: qty 1

## 2021-04-08 MED ORDER — HYDROCODONE-ACETAMINOPHEN 5-325 MG PO TABS: 1.0000 | ORAL_TABLET | Freq: Four times a day (QID) | ORAL | 0 refills | Status: AC | PRN

## 2021-04-08 MED ORDER — GLYCOPYRROLATE 0.2 MG/ML IJ SOLN
0.1000 mg | Freq: Once | INTRAMUSCULAR | Status: DC
  Filled 2021-04-08: qty 0.5

## 2021-04-08 MED ORDER — POLYETHYLENE GLYCOL 3350 17 G PO PACK: 17.0000 g | PACK | Freq: Every day | ORAL | 0 refills | Status: AC | PRN

## 2021-04-08 MED ORDER — MORPHINE SULFATE (CONCENTRATE) 10 MG/0.5ML PO SOLN: 10.0000 mg | ORAL | 0 refills | Status: AC

## 2021-04-08 NOTE — TOC Transition Note (Signed)
Transition of Care [facility]) - CM/SW Discharge Note   Patient Details  Name: Casey Watts MRN: [id number] Date of Birth: 1924/08/02  [facility] CM/SW Contact:  Bethann Berkshire, [place] Phone Number: Apr 16, 2021, 2:25 PM   Clinical Narrative:     Patient will DC to: [facility] Anticipated DC date: 04-16-21 Family notified: Ulla Potash Daughter in law Transport by: Corey Harold   Per MD patient ready for DC to [facility]. RN, patient, patient's family, and facility notified of DC. Discharge Summary sent to facility. RN to call report prior to discharge [phone]). DC packet on chart. Ambulance transport requested for patient.   CSW will sign off for now as social work intervention is no longer needed. Please consult [place] again if new needs arise.   Final next level of care: [place] Barriers to Discharge: No Barriers Identified   Patient Goals and CMS Choice        Discharge Placement              Patient chooses bed at:  [facility]) Patient to be transferred to facility by: [facility] Name of family member notified: Virgin Zellers Daughter in law Patient and family notified of of transfer: 16-Apr-2021  Discharge Plan and Services   Discharge Planning Services: CM Consult                                 Social Determinants of Health (SDOH) Interventions     Readmission Risk Interventions No flowsheet data found.

## 2021-04-08 NOTE — Discharge Summary (Signed)
Physician Discharge Summary  Casey Watts FGH:[id number] DOB: 01-20-1924 DOA: 03/29/2021  PCP: Merryl Hacker, No  Admit date: 03/29/2021 Discharge date: 04-19-21  Admitted From: ALF Disposition:  hospice  Recommendations for Outpatient Follow-up:  1. Follow up with hospice medical team  Home Health: n/a  Equipment/Devices: *n/a  Discharge Condition: stable  CODE STATUS: DNR Diet recommendation:Dysphagia level 2  Discharge Diagnoses: Principal Problem:   Closed fracture of right distal femur ([place]) Active Problems:   Chronic duodenal ulcer   GERD (gastroesophageal reflux disease)   Hypothyroidism   Hypertension   Renal insufficiency   Leukocytosis   Chronic pain   Diabetes mellitus type 2 in nonobese (HCC)   Chronic anemia   Iron deficiency anemia    Summary of HPI and Hospital Course:  Per H&P and prior progress notes: "Patient is a 87 year old female with history of anemia, chronic duodenal ulcer, chronic pain syndrome, constipation, diabetes type 2, GERD, hypertension, CKD who presented after a fall at her assisted living facility.  She was about to be transferred from 1 chair to another and sustained fall during that time.  X-rays showed right comminuted distal femur fracture.  Orthopedics consulted.  Patient underwent ORIF on 4/27.  She was also found with E. coli UTI which she was treated for.  She continued having pain even with light touch and family decided to discontinue physical therapy.  Due to her poor p.o. intake and nonambulatory at this point and pain family has decided on hospice.  Palliative care was consulted and they felt patient meets criteria for hospice facility for her care as prognosis is less than 2 weeks.  She was started on comfort measures."    Closed fracture of right distal femur: Fell while being transferred from 1 chair to another. X-ray showed comminuted distal femur fracture. status post ORIF on 4/27 Orthopedics following, okay to resume Eliquis   PT OT -SNF Ortho rec 2 weeks after dc for repeat x-ray, now hospice dispo  Discharge to hospice house today.  Pt clinically stable for transfer.  Leukocytosis-secondary to UTI Trended down.  Completed abx.  E. coli UTI-sensitive to Rocephin, course completed  Recent history of DVT:  Recently diagnosed and was started Eliquis 3 weeks ago. Stopped Eliquisgiven comfort GOC.  Chronic normocytic anemia/post op blood loss:Has history of iron deficiency anemia. s/p PRBC prior to surgery.  Hypertension: with hypotension during admission. Hypotension improved with IVF Started onmidodrine but stopped given comfort goals.  GERD/chronic duodenal ulcer: okay to continue PPI if helpful for comfort, otherwise would hold.  Diabetes type 2:covered with sliding scale insulin. Monitor blood sugars  CKD stage II: Currently kidney function stable and at baseline.AvoidNephrotoxins.  Dementia/debility:Advanced. Stable baseline confusion.  Non-ambulatory at baseline, chair bound. Was living at ALF prior to admission.    Discharge Instructions   Discharge Instructions    Call MD for:  persistant nausea and vomiting   Complete by: As directed    Call MD for:  severe uncontrolled pain   Complete by: As directed    Diet - low sodium heart healthy   Complete by: As directed    Discharge wound care:   Complete by: As directed    Monitor wounds closely.  Local wound care for comfort.   Increase activity slowly   Complete by: As directed      Allergies as of Apr 19, 2021   No Known Allergies     Medication List    STOP taking these medications   AQUAPHOR EX  Biofreeze 4 % Gel Generic drug: Menthol (Topical Analgesic)   cholecalciferol 1000 units tablet Commonly known as: VITAMIN D   Eliquis 5 MG Tabs tablet Generic drug: apixaban   feeding supplement (PRO-STAT SUGAR FREE 64) Liqd   ferrous sulfate 325 (65 FE) MG tablet   furosemide 20 MG tablet Commonly known  as: LASIX   glimepiride 1 MG tablet Commonly known as: AMARYL   lansoprazole 15 MG capsule Commonly known as: PREVACID   levothyroxine 200 MCG tablet Commonly known as: SYNTHROID   multivitamin with minerals Tabs tablet   Remedy Calazime/Olivamine Pste   valsartan-hydrochlorothiazide 80-12.5 MG tablet Commonly known as: DIOVAN-HCT   vitamin C 500 MG tablet Commonly known as: ASCORBIC ACID     TAKE these medications   acetaminophen 650 MG CR tablet Commonly known as: TYLENOL Take 650 mg by mouth See admin instructions. Take one tablet (650 mg) by mouth twice daily, may also take one tablet (650 mg) daily as needed for pain   docusate sodium 100 MG capsule Commonly known as: COLACE Take 1 capsule (100 mg total) by mouth 2 (two) times daily as needed for mild constipation.   HYDROcodone-acetaminophen 5-325 MG tablet Commonly known as: NORCO/VICODIN Take 1 tablet by mouth every 6 (six) hours as needed for severe pain.   lidocaine 5 % Commonly known as: LIDODERM Place 1 patch onto the skin daily. Remove & Discard patch within 12 hours or as directed by MD   morphine CONCENTRATE 10 MG/0.5ML Soln concentrated solution Take 0.5 mLs (10 mg total) by mouth every 4 (four) hours.   polyethylene glycol 17 g packet Commonly known as: MIRALAX / GLYCOLAX Take 17 g by mouth daily as needed for mild constipation.   traZODone 50 MG tablet Commonly known as: DESYREL Take 0.5 tablets (25 mg total) by mouth at bedtime as needed for sleep.            Discharge Care Instructions  (From admission, onward)         Start     Ordered   01/28/2021 0000  Discharge wound care:       Comments: Monitor wounds closely.  Local wound care for comfort.   01/28/2021 1155          Follow-up Information    Haddix, Gillie MannersKevin P, MD. Schedule an appointment as soon as possible for a visit in 2 week(s).   Specialty: Orthopedic Surgery Why: for wound check and repeat x-rays Contact  information: [address] [place] [address] [phone]              No Known Allergies   If you experience worsening of your admission symptoms, develop shortness of breath, life threatening emergency, suicidal or homicidal thoughts you must seek medical attention immediately by calling 911 or calling your MD immediately  if symptoms less severe.    Please note   You were cared for by a hospitalist during your hospital stay. If you have any questions about your discharge medications or the care you received while you were in the hospital after you are discharged, you can call the unit and asked to speak with the hospitalist on call if the hospitalist that took care of you is not available. Once you are discharged, your primary care physician will handle any further medical issues. Please note that NO REFILLS for any discharge medications will be authorized once you are discharged, as it is imperative that you return to your primary care physician (or establish  a relationship with a primary care physician if you do not have one) for your aftercare needs so that they can reassess your need for medications and monitor your lab values.   Consultations:  Palliative care  Orthopedics    Procedures/Studies: DG Chest 1 View  Result Date: 03/29/2021 CLINICAL DATA:  Fall. EXAM: CHEST  1 VIEW COMPARISON:  03/20/2018 FINDINGS: Normal cardiac silhouette. Linear atelectasis in the RIGHT midlung. Lungs are hyperinflated. No effusion infiltrate or pneumothorax identified. LEFT shoulder arthroplasty noted. IMPRESSION: 1. No evidence of thoracic trauma. 2. RIGHT lung linear atelectasis. Electronically Signed   By: Suzy Bouchard M.D.   On: 03/29/2021 17:35   DG Pelvis 1-2 Views  Result Date: 03/29/2021 CLINICAL DATA:  87 year old female with fall. EXAM: PELVIS - 1-2 VIEW COMPARISON:  None. FINDINGS: Evaluation is limited due to advanced osteopenia and superimposition of the right hand  over the right femur trochanter. No acute fracture identified. There is no dislocation. Moderate left and advanced right hip arthritic changes. Degenerative changes of the lower lumbar spine. The soft tissues are unremarkable. IMPRESSION: No acute fracture or dislocation. Electronically Signed   By: Anner Crete M.D.   On: 03/29/2021 17:29   CT Head Wo Contrast  Result Date: 03/29/2021 CLINICAL DATA:  Fall.  Head trauma EXAM: CT HEAD WITHOUT CONTRAST TECHNIQUE: Contiguous axial images were obtained from the base of the skull through the vertex without intravenous contrast. COMPARISON:  None. FINDINGS: Brain: No acute intracranial hemorrhage. No focal mass lesion. No CT evidence of acute infarction. No midline shift or mass effect. No hydrocephalus. Basilar cisterns are patent. There are periventricular and subcortical white matter hypodensities. Generalized cortical atrophy. Vascular: No hyperdense vessel or unexpected calcification. Skull: Normal. Negative for fracture or focal lesion. Sinuses/Orbits: Paranasal sinuses and mastoid air cells are clear. Orbits are clear. Other: None. IMPRESSION: 1. No intracranial trauma. 2. Atrophy and white matter microvascular disease Electronically Signed   By: Suzy Bouchard M.D.   On: 03/29/2021 18:01   CT Lumbar Spine Wo Contrast  Result Date: 03/29/2021 CLINICAL DATA:  87 year old post fall when transferring from 1 chair to another. EXAM: CT LUMBAR SPINE WITHOUT CONTRAST TECHNIQUE: Multidetector CT imaging of the lumbar spine was performed without intravenous contrast administration. Multiplanar CT image reconstructions were also generated. COMPARISON:  None. FINDINGS: Segmentation: 5 lumbar type vertebrae.  S1 is partially lumbarized. Alignment: Broad-based dextroscoliotic curvature. 4 mm anterolisthesis of L5 on S1. Vertebrae: No acute fracture. No vertebral body compression. None fusion posterior elements of S1. No fracture of included sacrum. Bones diffusely  under mineralized. Paraspinal and other soft tissues: No acute or traumatic findings. Fatty atrophy of paraspinal musculature. Aortic atherosclerosis. Disc levels: Diffuse degenerative disc disease with disc space narrowing and endplate spurring. There is multilevel facet hypertrophy. Mild bony canal narrowing at L4-L5. No high-grade canal stenosis. IMPRESSION: 1. No acute fracture of the lumbar spine. 2. Broad-based dextroscoliotic curvature with multilevel degenerative disc disease and facet hypertrophy. Aortic Atherosclerosis (ICD10-I70.0). Electronically Signed   By: Keith Rake M.D.   On: 03/29/2021 17:58   DG Knee Complete 4 Views Right  Result Date: 03/29/2021 CLINICAL DATA:  Fall.  87 year old female EXAM: RIGHT KNEE - COMPLETE 4+ VIEW COMPARISON:  None. FINDINGS: Oblique fracture through the distal femoral metadiaphysis with medial displacement of the distal fracture fragment by 1 bone width. Mild comminution along the fracture plane which enters the proximal metaphysis. IMPRESSION: Oblique comminuted displaced fracture of the distal femur as above. Electronically Signed  By: Suzy Bouchard M.D.   On: 03/29/2021 17:29   DG C-Arm 1-60 Min  Result Date: 03/31/2021 CLINICAL DATA:  Intraoperative imaging.  ORIF. EXAM: RIGHT FEMUR 2 VIEWS; DG C-ARM 1-60 MIN COMPARISON:  Knee radiographs March 29, 2021. FINDINGS: Fluoro time 53 seconds. Reported radiation 3.31 mGy. Seven C-arm fluoroscopic images were obtained intraoperatively and submitted for post operative interpretation. These images demonstrate plate and screw fixation of a comminuted oblique fracture of the distal femoral diaphysis. Improved alignment with approximately quarter width shaft medial displacement of the distal fracture fragment. Please see the performing provider's procedural report for further detail. IMPRESSION: ORIF the femur with plate and screws. Electronically Signed   By: Margaretha Sheffield MD   On: 03/31/2021 11:40   DG  FEMUR, MIN 2 VIEWS RIGHT  Result Date: 03/31/2021 CLINICAL DATA:  Status post surgical internal fixation of right distal femoral fracture. EXAM: RIGHT FEMUR 2 VIEWS COMPARISON:  March 29, 2021. FINDINGS: Status post surgical internal fixation of comminuted distal right femoral shaft fracture. Improved alignment of fracture components is noted. IMPRESSION: Status post open reduction and surgical internal fixation of right distal femoral fracture. Electronically Signed   By: Marijo Conception M.D.   On: 03/31/2021 14:13   DG FEMUR, MIN 2 VIEWS RIGHT  Result Date: 03/31/2021 CLINICAL DATA:  Intraoperative imaging.  ORIF. EXAM: RIGHT FEMUR 2 VIEWS; DG C-ARM 1-60 MIN COMPARISON:  Knee radiographs March 29, 2021. FINDINGS: Fluoro time 53 seconds. Reported radiation 3.31 mGy. Seven C-arm fluoroscopic images were obtained intraoperatively and submitted for post operative interpretation. These images demonstrate plate and screw fixation of a comminuted oblique fracture of the distal femoral diaphysis. Improved alignment with approximately quarter width shaft medial displacement of the distal fracture fragment. Please see the performing provider's procedural report for further detail. IMPRESSION: ORIF the femur with plate and screws. Electronically Signed   By: Margaretha Sheffield MD   On: 03/31/2021 11:40   DG FEMUR, MIN 2 VIEWS RIGHT  Result Date: 03/29/2021 CLINICAL DATA:  87 year old female with fall and trauma to the right lower extremity. EXAM: RIGHT FEMUR 2 VIEWS COMPARISON:  Pelvic radiograph dated 03/29/2021. FINDINGS: There is a comminuted oblique fracture of the distal femoral diaphysis. There is medial and anterior angulation of the major distal fracture fragment. No dislocation. There is advanced osteopenia with severe degenerative changes of the right knee. There is soft tissue swelling of the anterior soft tissues above the knee with probable small suprapatellar effusion. Vascular calcification.  IMPRESSION: Comminuted, displaced and angulated, oblique fracture of the distal femoral diaphysis. Electronically Signed   By: Anner Crete M.D.   On: 03/29/2021 17:32       Subjective: Pt sleeping but wakes to voice. Daughter and daughter-in-law at bedside.  Pt occasionally winces as if in pain.  No other complaints at this time.   Discharge Exam: Vitals:   04/07/21 0535 04/07/21 1949  BP: (!) 108/53 (!) 110/47  Pulse: (!) 111 84  Resp: 16 14  Temp: 98.2 F (36.8 C) 98.7 F (37.1 C)  SpO2: 90% 90%   Vitals:   04/06/21 1500 04/06/21 2057 04/07/21 0535 04/07/21 1949  BP: (!) 107/54 (!) 107/48 (!) 108/53 (!) 110/47  Pulse: (!) 54 (!) 59 (!) 111 84  Resp: 17 16 16 14   Temp: 98.8 F (37.1 C) 97.6 F (36.4 C) 98.2 F (36.8 C) 98.7 F (37.1 C)  TempSrc: Oral   Oral  SpO2: 97% 100% 90% 90%  Weight:  Height:        General: Pt is alert, awake, not in acute distress Cardiovascular: RRR, S1/S2 +, no rubs, no gallops Respiratory: CTA bilaterally, no wheezing, no rhonchi Abdominal: Soft, NT, ND, bowel sounds + Extremities: LUE in dressing clean dry intact, warm extremities, moves all.    The results of significant diagnostics from this hospitalization (including imaging, microbiology, ancillary and laboratory) are listed below for reference.     Microbiology: Recent Results (from the past 240 hour(s))  Resp Panel by RT-PCR (Flu A&B, Covid) Nasopharyngeal Swab     Status: None   Collection Time: 03/29/21  5:49 PM   Specimen: Nasopharyngeal Swab; Nasopharyngeal(NP) swabs in vial transport medium  Result Value Ref Range Status   SARS Coronavirus 2 by RT PCR NEGATIVE NEGATIVE Final    Comment: (NOTE) SARS-CoV-2 target nucleic acids are NOT DETECTED.  The SARS-CoV-2 RNA is generally detectable in upper respiratory specimens during the acute phase of infection. The lowest concentration of SARS-CoV-2 viral copies this assay can detect is 138 copies/mL. A negative  result does not preclude SARS-Cov-2 infection and should not be used as the sole basis for treatment or other patient management decisions. A negative result may occur with  improper specimen collection/handling, submission of specimen other than nasopharyngeal swab, presence of viral mutation(s) within the areas targeted by this assay, and inadequate number of viral copies(<138 copies/mL). A negative result must be combined with clinical observations, patient history, and epidemiological information. The expected result is Negative.  Fact Sheet for Patients:  EntrepreneurPulse.com.au  Fact Sheet for Healthcare Providers:  IncredibleEmployment.be  This test is no t yet approved or cleared by the [place] FDA and  has been authorized for detection and/or diagnosis of SARS-CoV-2 by FDA under an Emergency Use Authorization (EUA). This EUA will remain  in effect (meaning this test can be used) for the duration of the COVID-19 declaration under Section 564(b)(1) of the Act, 21 U.S.C.section 360bbb-3(b)(1), unless the authorization is terminated  or revoked sooner.       Influenza A by PCR NEGATIVE NEGATIVE Final   Influenza B by PCR NEGATIVE NEGATIVE Final    Comment: (NOTE) The Xpert Xpress SARS-CoV-2/FLU/RSV plus assay is intended as an aid in the diagnosis of influenza from Nasopharyngeal swab specimens and should not be used as a sole basis for treatment. Nasal washings and aspirates are unacceptable for Xpert Xpress SARS-CoV-2/FLU/RSV testing.  Fact Sheet for Patients: EntrepreneurPulse.com.au  Fact Sheet for Healthcare Providers: IncredibleEmployment.be  This test is not yet approved or cleared by the [place] FDA and has been authorized for detection and/or diagnosis of SARS-CoV-2 by FDA under an Emergency Use Authorization (EUA). This EUA will remain in effect (meaning this test can be used)  for the duration of the COVID-19 declaration under Section 564(b)(1) of the Act, 21 U.S.C. section 360bbb-3(b)(1), unless the authorization is terminated or revoked.  Performed at [facility], [place] [address], [place], [place] [address]   Surgical pcr screen     Status: None   Collection Time: 03/30/21 12:50 AM   Specimen: Nasal Mucosa; Nasal Swab  Result Value Ref Range Status   MRSA, PCR NEGATIVE NEGATIVE Final   Staphylococcus aureus NEGATIVE NEGATIVE Final    Comment: (NOTE) The Xpert SA Assay (FDA approved for NASAL specimens in patients 24 years of age and older), is one component of a comprehensive surveillance program. It is not intended to diagnose infection nor to guide or monitor treatment. Performed at  [facility], [place] [address] [place], [place] [address]   Culture, Urine     Status: Abnormal   Collection Time: 04/01/21  1:41 PM   Specimen: Urine, Catheterized  Result Value Ref Range Status   Specimen Description URINE, CATHETERIZED  Final   Special Requests   Final    NONE Performed at [facility], [address], [place],  [address]    Culture >=100,000 COLONIES/mL ESCHERICHIA COLI (A)  Final   Report Status 04/03/2021 FINAL  Final   Organism ID, Bacteria ESCHERICHIA COLI (A)  Final      Susceptibility   Escherichia coli - MIC*    AMPICILLIN >=32 RESISTANT Resistant     CEFAZOLIN 8 SENSITIVE Sensitive     CEFEPIME <=0.12 SENSITIVE Sensitive     CEFTRIAXONE <=0.25 SENSITIVE Sensitive     CIPROFLOXACIN >=4 RESISTANT Resistant     GENTAMICIN <=1 SENSITIVE Sensitive     IMIPENEM <=0.25 SENSITIVE Sensitive     NITROFURANTOIN <=16 SENSITIVE Sensitive     TRIMETH/SULFA <=20 SENSITIVE Sensitive     AMPICILLIN/SULBACTAM 8 SENSITIVE Sensitive     PIP/TAZO <=4 SENSITIVE Sensitive     * >=100,000 COLONIES/mL ESCHERICHIA COLI     Labs: BNP (last 3 results) No results for input(s): BNP in the last 8760 hours. Basic Metabolic  Panel: Recent Labs  Lab 04/02/21 0012 04/03/21 0212  NA 137 136  K 4.8 4.8  CL 105 106  CO2 27 23  GLUCOSE 86 145*  BUN 31* 30*  CREATININE 1.11* 1.00  CALCIUM 7.8* 7.9*   Liver Function Tests: No results for input(s): AST, ALT, ALKPHOS, BILITOT, PROT, ALBUMIN in the last 168 hours. No results for input(s): LIPASE, AMYLASE in the last 168 hours. No results for input(s): AMMONIA in the last 168 hours. CBC: Recent Labs  Lab 04/02/21 0012 04/04/21 0104  WBC 14.4* 12.7*  HGB 8.7* 10.9*  HCT 27.4* 33.6*  MCV 100.0 99.4  PLT 283 366   Cardiac Enzymes: No results for input(s): CKTOTAL, CKMB, CKMBINDEX, TROPONINI in the last 168 hours. BNP: Invalid input(s): POCBNP CBG: Recent Labs  Lab 04/05/21 1540 04/05/21 2031 04/06/21 0647 04/06/21 0751 04/06/21 1145  GLUCAP 153* 111* 81 84 156*   D-Dimer No results for input(s): DDIMER in the last 72 hours. Hgb A1c No results for input(s): HGBA1C in the last 72 hours. Lipid Profile No results for input(s): CHOL, HDL, LDLCALC, TRIG, CHOLHDL, LDLDIRECT in the last 72 hours. Thyroid function studies No results for input(s): TSH, T4TOTAL, T3FREE, THYROIDAB in the last 72 hours.  Invalid input(s): FREET3 Anemia work up No results for input(s): VITAMINB12, FOLATE, FERRITIN, TIBC, IRON, RETICCTPCT in the last 72 hours. Urinalysis    Component Value Date/Time   COLORURINE YELLOW (A) 04/01/2021 1149   APPEARANCEUR TURBID (A) 04/01/2021 1149   LABSPEC 1.025 04/01/2021 1149   PHURINE 5.0 04/01/2021 1149   GLUCOSEU NEGATIVE 04/01/2021 1149   HGBUR SMALL (A) 04/01/2021 1149   BILIRUBINUR NEGATIVE 04/01/2021 1149   KETONESUR 5 (A) 04/01/2021 1149   PROTEINUR 100 (A) 04/01/2021 1149   NITRITE NEGATIVE 04/01/2021 1149   LEUKOCYTESUR MODERATE (A) 04/01/2021 1149   Sepsis Labs Invalid input(s): PROCALCITONIN,  WBC,  LACTICIDVEN Microbiology Recent Results (from the past 240 hour(s))  Resp Panel by RT-PCR (Flu A&B, Covid)  Nasopharyngeal Swab     Status: None   Collection Time: 03/29/21  5:49 PM   Specimen: Nasopharyngeal Swab; Nasopharyngeal(NP) swabs in vial transport medium  Result Value Ref Range Status   SARS Coronavirus 2 by RT PCR NEGATIVE NEGATIVE Final    Comment: (NOTE) SARS-CoV-2 target nucleic acids are NOT DETECTED.  The SARS-CoV-2 RNA is generally detectable in upper respiratory specimens during the acute phase of infection. The lowest concentration of SARS-CoV-2 viral copies this assay can detect is 138 copies/mL. A negative result does not preclude SARS-Cov-2 infection and should not be used as the sole basis for treatment or other patient management decisions. A negative result may occur with  improper specimen collection/handling, submission of specimen other than nasopharyngeal swab, presence of viral mutation(s) within the areas targeted by this assay, and inadequate number of viral copies(<138 copies/mL). A negative result must be combined with clinical observations, patient history, and epidemiological information. The expected result is Negative.  Fact Sheet for Patients:  EntrepreneurPulse.com.au  Fact Sheet for Healthcare Providers:  IncredibleEmployment.be  This test is no t yet approved or cleared by the [place] FDA and  has been authorized for detection and/or diagnosis of SARS-CoV-2 by FDA under an Emergency Use Authorization (EUA). This EUA will remain  in effect (meaning this test can be used) for the duration of the COVID-19 declaration under Section 564(b)(1) of the Act, 21 U.S.C.section 360bbb-3(b)(1), unless the authorization is terminated  or revoked sooner.       Influenza A by PCR NEGATIVE NEGATIVE Final   Influenza B by PCR NEGATIVE NEGATIVE Final    Comment: (NOTE) The Xpert Xpress SARS-CoV-2/FLU/RSV plus assay is intended as an aid in the diagnosis of influenza from Nasopharyngeal swab specimens and should not be  used as a sole basis for treatment. Nasal washings and aspirates are unacceptable for Xpert Xpress SARS-CoV-2/FLU/RSV testing.  Fact Sheet for Patients: EntrepreneurPulse.com.au  Fact Sheet for Healthcare Providers: IncredibleEmployment.be  This test is not yet approved or cleared by the [place] FDA and has been authorized for detection and/or diagnosis of SARS-CoV-2 by FDA under an Emergency Use Authorization (EUA). This EUA will remain in effect (meaning this test can be used) for the duration of the COVID-19 declaration under Section 564(b)(1) of the Act, 21 U.S.C. section 360bbb-3(b)(1), unless the authorization is terminated or revoked.  Performed at [facility], [place] [address], [place], [place] [address]   Surgical pcr screen     Status: None   Collection Time: 03/30/21 12:50 AM   Specimen: Nasal Mucosa; Nasal Swab  Result Value Ref Range Status   MRSA, PCR NEGATIVE NEGATIVE Final   Staphylococcus aureus NEGATIVE NEGATIVE Final    Comment: (NOTE) The Xpert SA Assay (FDA approved for NASAL specimens in patients 84 years of age and older), is one component of a comprehensive surveillance program. It is not intended to diagnose infection nor to guide or monitor treatment. Performed at [facility], [place] [address], [place], [place] [address]   Culture, Urine     Status: Abnormal   Collection Time: 04/01/21  1:41 PM   Specimen: Urine, Catheterized  Result Value Ref Range Status   Specimen Description URINE, CATHETERIZED  Final   Special Requests   Final    NONE Performed at [facility], [address], [place], [place] [address]    Culture >=100,000 COLONIES/mL ESCHERICHIA COLI (A)  Final   Report Status 04/03/2021 FINAL  Final   Organism ID, Bacteria ESCHERICHIA COLI (A)  Final      Susceptibility   Escherichia coli - MIC*    AMPICILLIN >=32 RESISTANT Resistant  CEFAZOLIN 8 SENSITIVE Sensitive      CEFEPIME <=0.12 SENSITIVE Sensitive     CEFTRIAXONE <=0.25 SENSITIVE Sensitive     CIPROFLOXACIN >=4 RESISTANT Resistant     GENTAMICIN <=1 SENSITIVE Sensitive     IMIPENEM <=0.25 SENSITIVE Sensitive     NITROFURANTOIN <=16 SENSITIVE Sensitive     TRIMETH/SULFA <=20 SENSITIVE Sensitive     AMPICILLIN/SULBACTAM 8 SENSITIVE Sensitive     PIP/TAZO <=4 SENSITIVE Sensitive     * >=100,000 COLONIES/mL ESCHERICHIA COLI     Time coordinating discharge: Over 30 minutes  SIGNED:   Ezekiel Slocumb, DO [facility] 05-04-2021, 11:55 AM   If 7PM-7AM, please contact night-coverage [email or web address]

## 2021-04-08 NOTE — Progress Notes (Signed)
Palliative Care Progress Note  Casey Watts is resting comfortably. She arouses easily however to voice and gentle stimulation and becomes agitated easily. Her pain is improved with scheduled morphine but she still has significant distress with moving or providing personal care. She is still refusing food, I offered her water while I was in the room and she refused.She appeared comfortable only when not being disturbed. Family was at the bedside most of the day providing support and presence.  Her color is more pale and dusky today. She is less interactive. Further exam deferred to maintain comfort.  Recommendations:  1. Transfer to [facility] when bed is available.  2. Maintain scheduled morphine and PRNs when moving or turning.  3. Maintain oral care and offer PO as tolerated only if requested.  4. Comfort Measures Only.    I updated her son Casey Watts, Casey Watts by phone including hospice approval and discharge plan. I also discussed her current condition, EOL trajectory and symptom management - he is in agreement with current care plan.  Lane Hacker, DO Palliative Medicine  Time: 35 min Greater than 50%  of this time was spent counseling and coordinating care related to the above assessment and plan.

## 2021-04-08 NOTE — TOC Progression Note (Addendum)
Transition of Care [facility]) - Progression Note    Patient Details  Name: Casey Watts MRN: [id number] Date of Birth: 07-11-1924  Transition of Care [facility]) CM/SW [place], [place] Phone Number: 04-23-2021, 11:14 AM  Clinical Narrative:     CSW confirmed with Domenic Moras that pt can be admitted to [facility] today. Pt would need a updated covid test. Covid requested; MD and RN notified.   CSW submitted auth for ambulance transport for [employer].  1325: Auth for [facility] transport is approved. Auth number is [id number]   Expected Discharge Plan: Assisted Living (with hospice care) Barriers to Discharge: Continued Medical Work up  Expected Discharge Plan and Services Expected Discharge Plan: Assisted Living (with hospice care)   Discharge Planning Services: CM Consult                                           Social Determinants of Health (SDOH) Interventions    Readmission Risk Interventions No flowsheet data found.

## 2021-04-08 NOTE — Progress Notes (Signed)
Manufacturing engineer [facility]) Hospital Liaison note.    Chart reviewed and eligibility confirmed for [facility]. Spoke with family to confirm interest and explain services. Family agreeable to transfer today.  TOC aware.    [facility] will notify TOC when registration paperwork has been completed to arrange transport.   RN please call report to [phone].  Please be sure the signed [facility] DNR form transports with the patient.  Thank you for the opportunity to participate in this patient's care.  Domenic Moras, BSN, RN [facility] Liaison (listed on [place] [facility])    [phone] (24h on call)

## 2021-05-05 DEATH — deceased

## 2021-07-09 IMAGING — CT CT HEAD W/O CM
3 series · 16 of 47 positions shown, 19 images · non-contrast
Comparison: None.

CLINICAL DATA: Fall.  Head trauma

EXAM:
CT HEAD WITHOUT CONTRAST
TECHNIQUE: Contiguous axial images were obtained from the base of the skull
through the vertex without intravenous contrast.

[Series 4: head 5.0 (person_name)30(person_name) (person_name · axial · 0.40mm/px · z∈[-184,-39]mm · 10 of 35 slices shown, 13 images]
[im 3/35  brain]
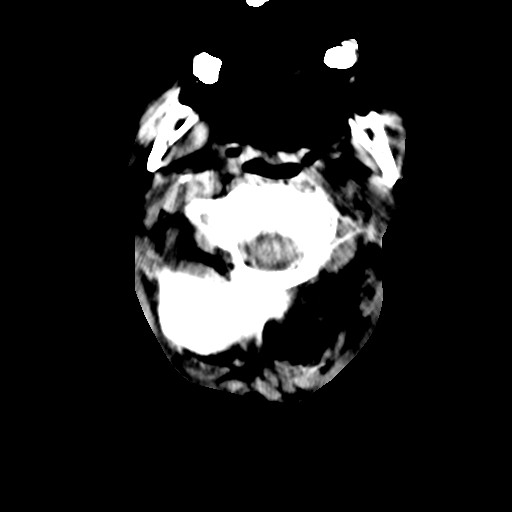
[im 3/35  bone]
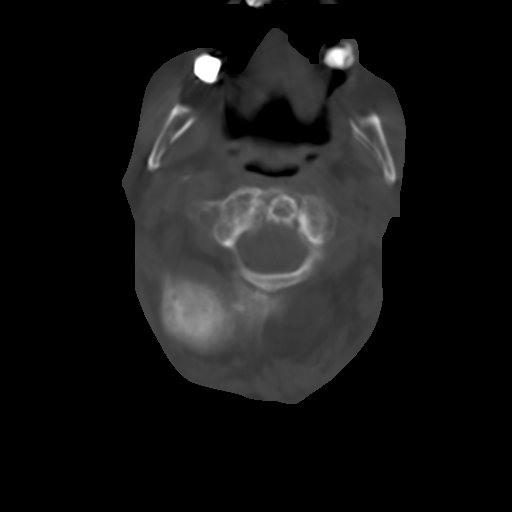
[im 6/35  brain]
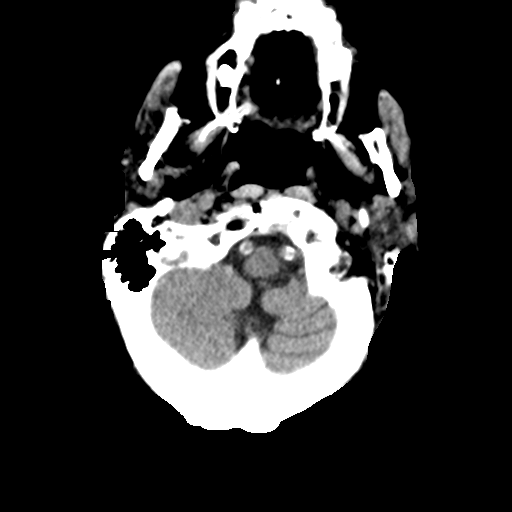
[im 10/35  brain]
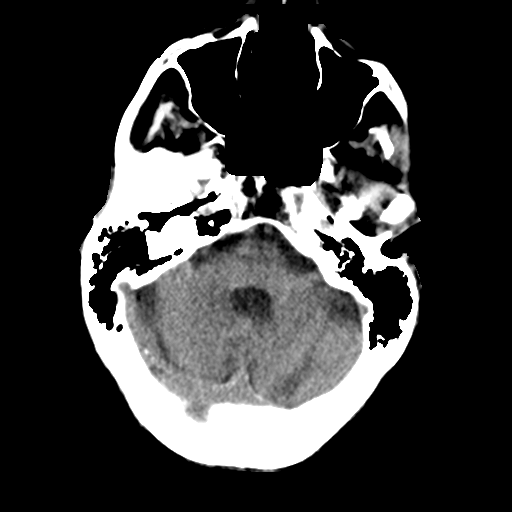
[im 12/35  brain]
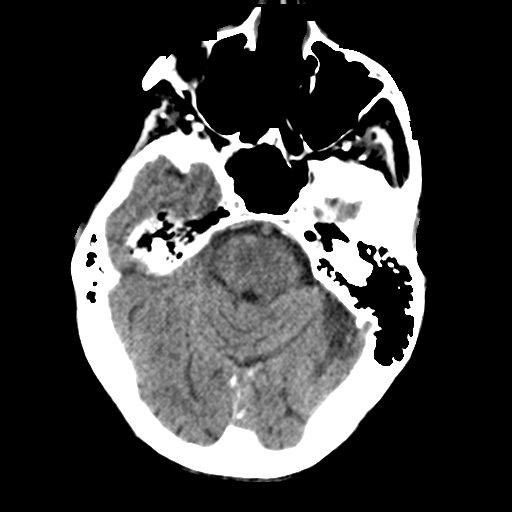
[im 16/35  brain]
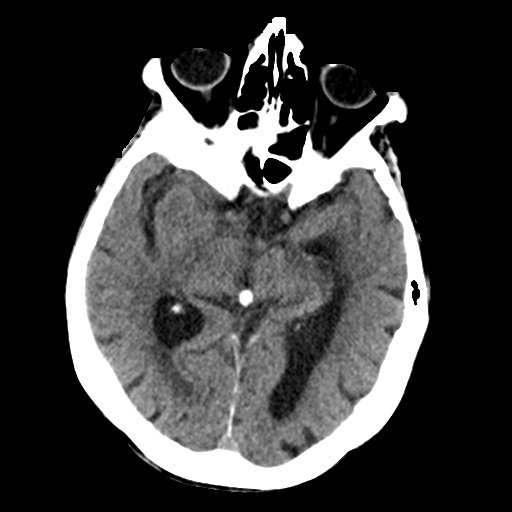
[im 16/35  bone]
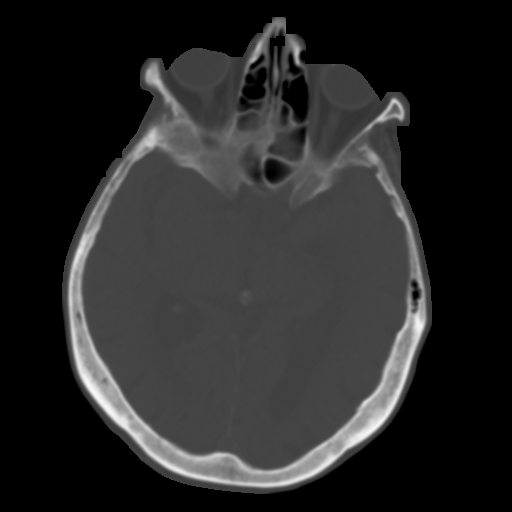
[im 19/35  brain]
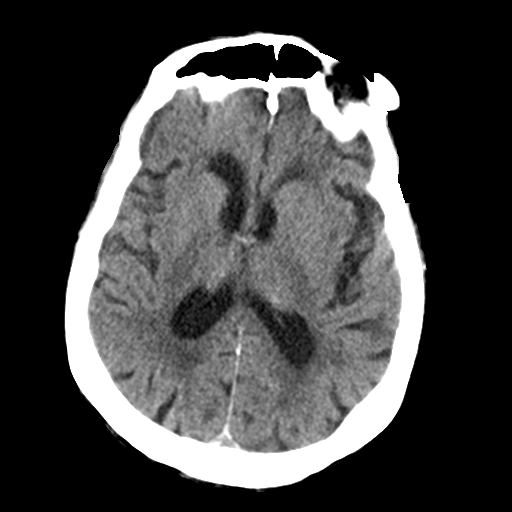
[im 23/35  brain]
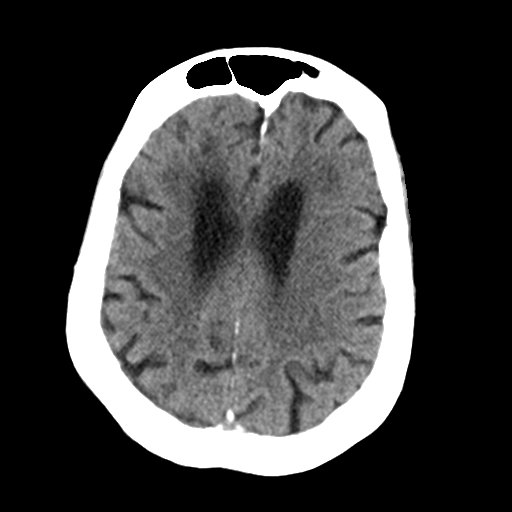
[im 26/35  brain]
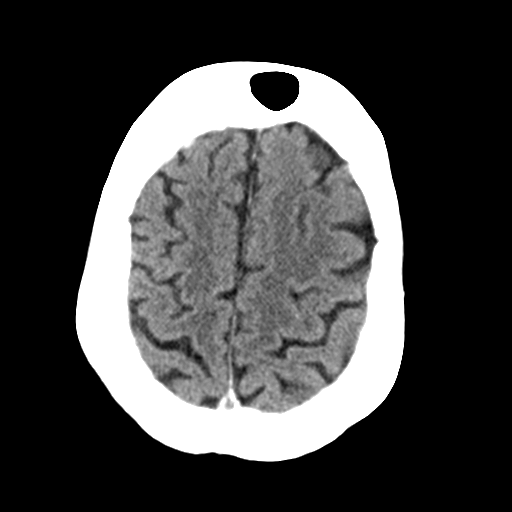
[im 29/35  brain]
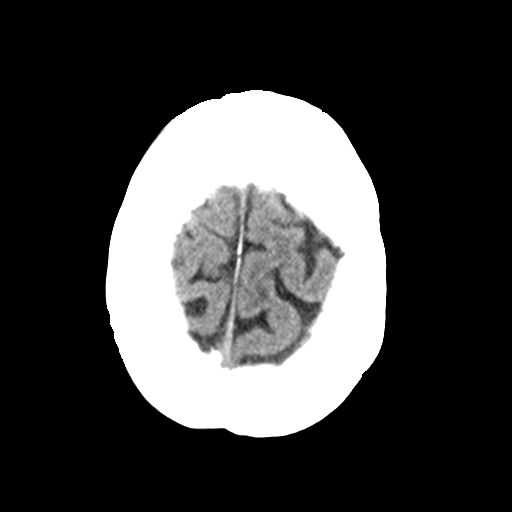
[im 29/35  bone]
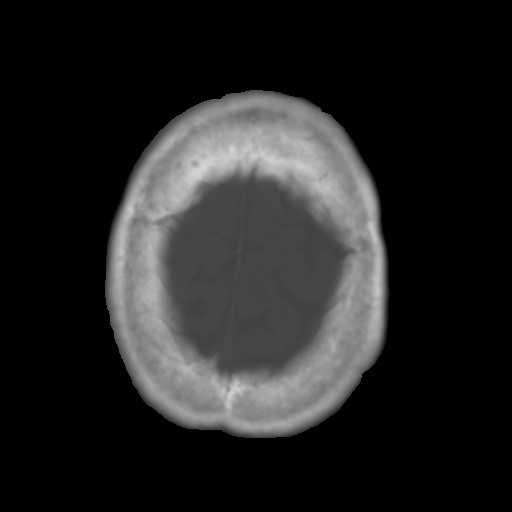
[im 32/35  brain]
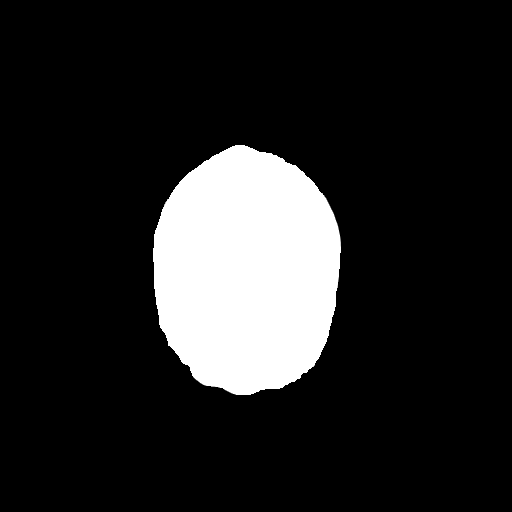

[Series 5: head 3.0 mpr cor · coronal · 0.33mm/px · 3 of 65 slices shown]
[im 22/65  brain]
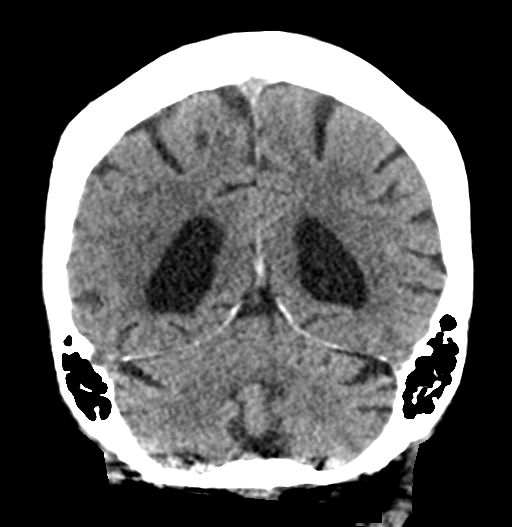
[im 29/65  brain]
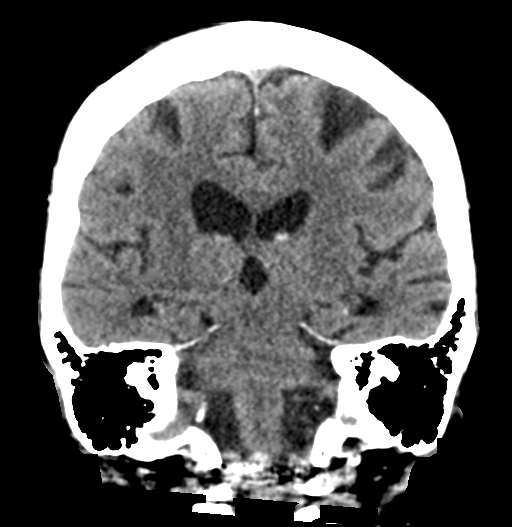
[im 36/65  brain]
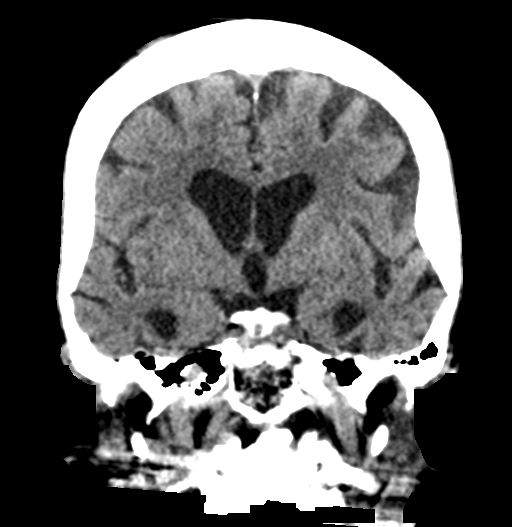

[Series 6: head 3.0 mpr sag · sagittal · 0.34mm/px · 3 of 51 slices shown]
[im 17/51  brain]
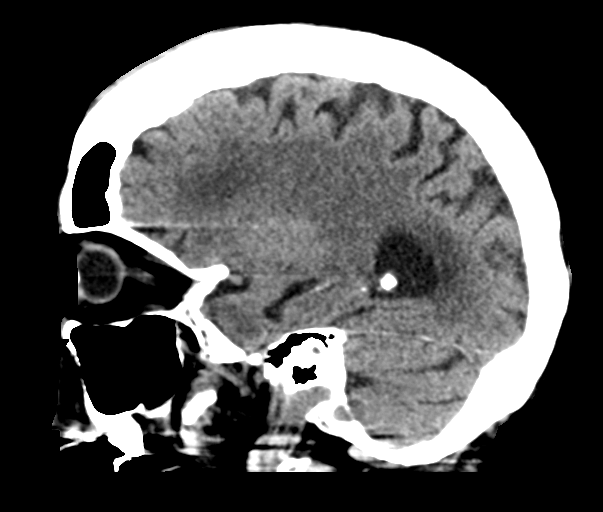
[im 26/51  brain]
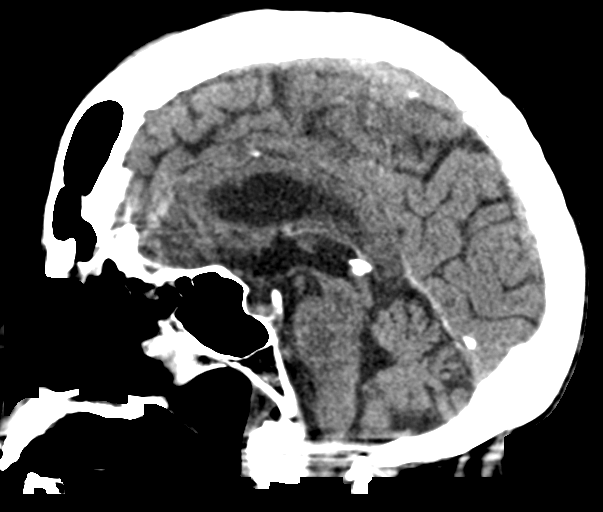
[im 34/51  brain]
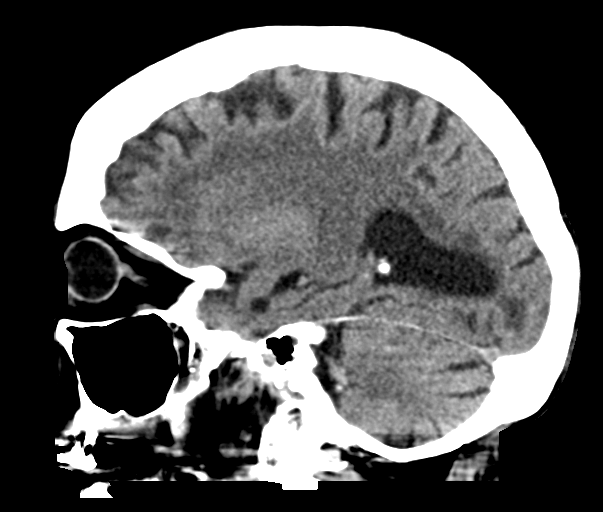

[16 of 47 positions shown; findings below may reference images not displayed]

FINDINGS: Brain: No acute intracranial hemorrhage. No focal mass lesion. No CT
evidence of acute infarction. No midline shift or mass effect. No
hydrocephalus. Basilar cisterns are patent.

There are periventricular and subcortical white matter
hypodensities. Generalized cortical atrophy.

Vascular: No hyperdense vessel or unexpected calcification.

Skull: Normal. Negative for fracture or focal lesion.

Sinuses/Orbits: Paranasal sinuses and mastoid air cells are clear.
Orbits are clear.

Other: None.
IMPRESSION: 1. No intracranial trauma.
2. Atrophy and white matter microvascular disease

## 2021-07-09 IMAGING — CR DG KNEE COMPLETE 4+V*R*
3 series · 3 of 3 positions shown · non-contrast
Comparison: None.

CLINICAL DATA: Fall.  [AGE] female

EXAM:
RIGHT KNEE - COMPLETE 4+ VIEW

[knee ap]
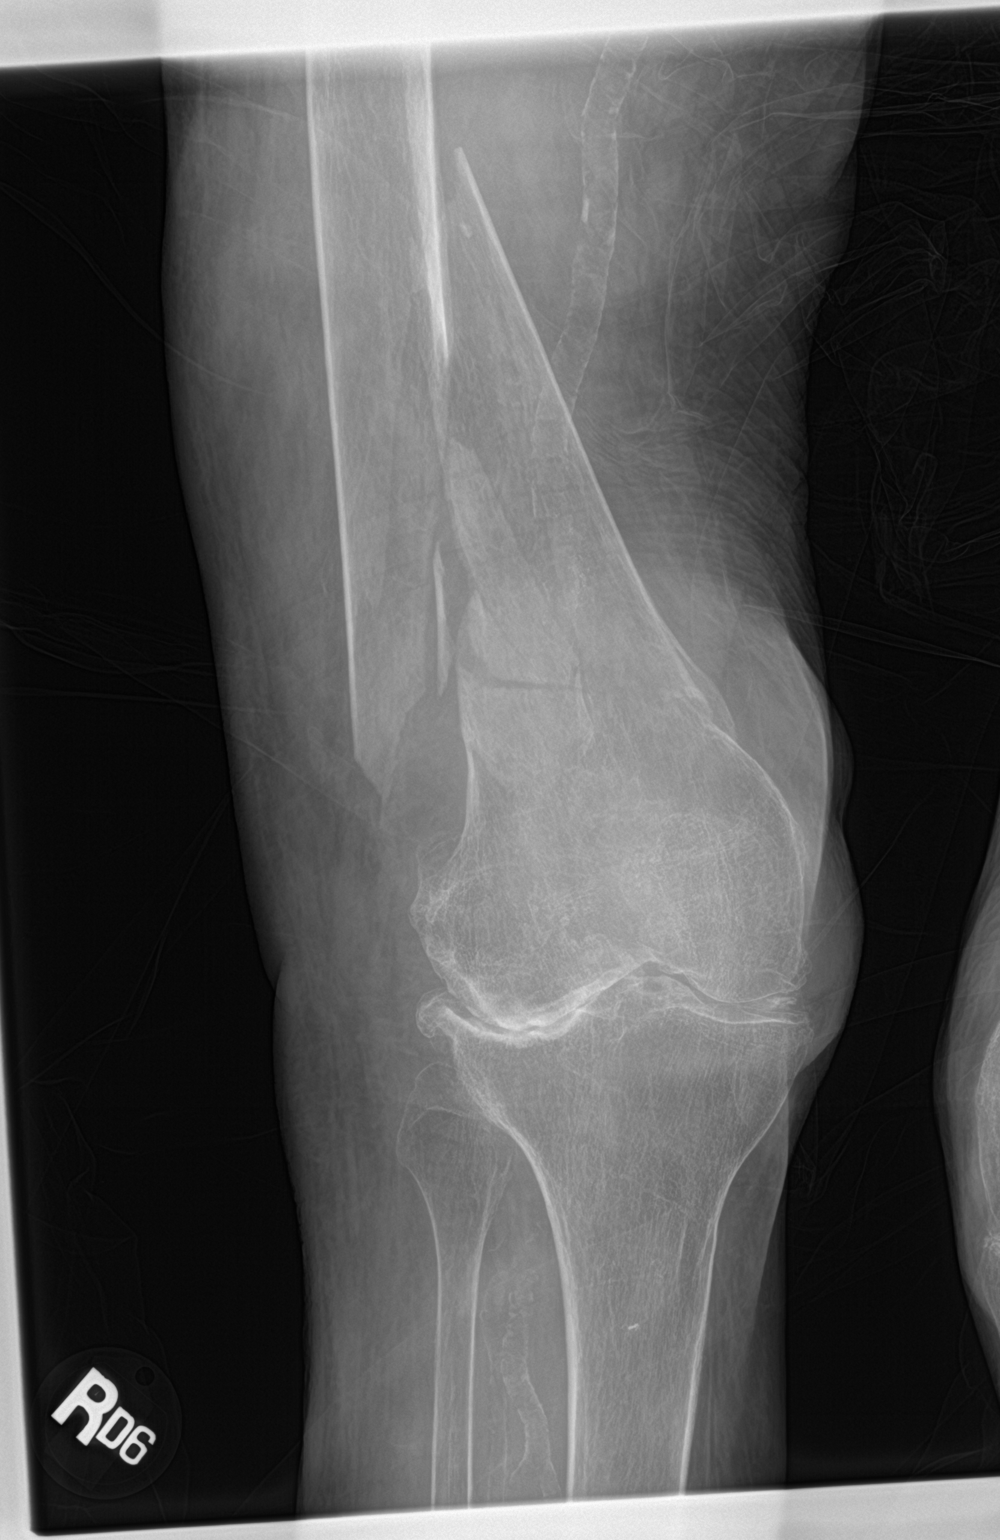

[femur lat]
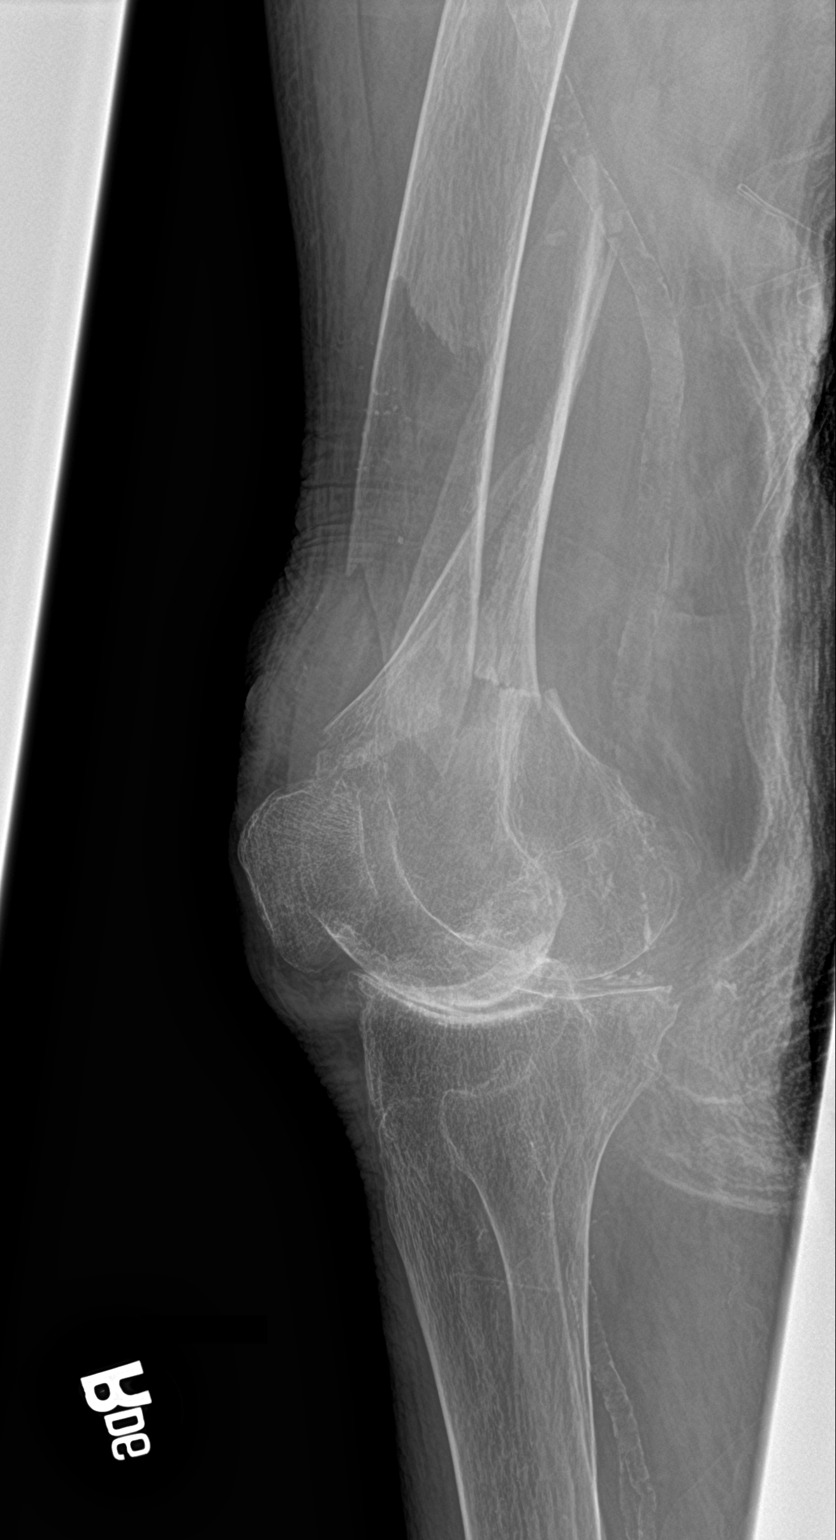

[knee obl]
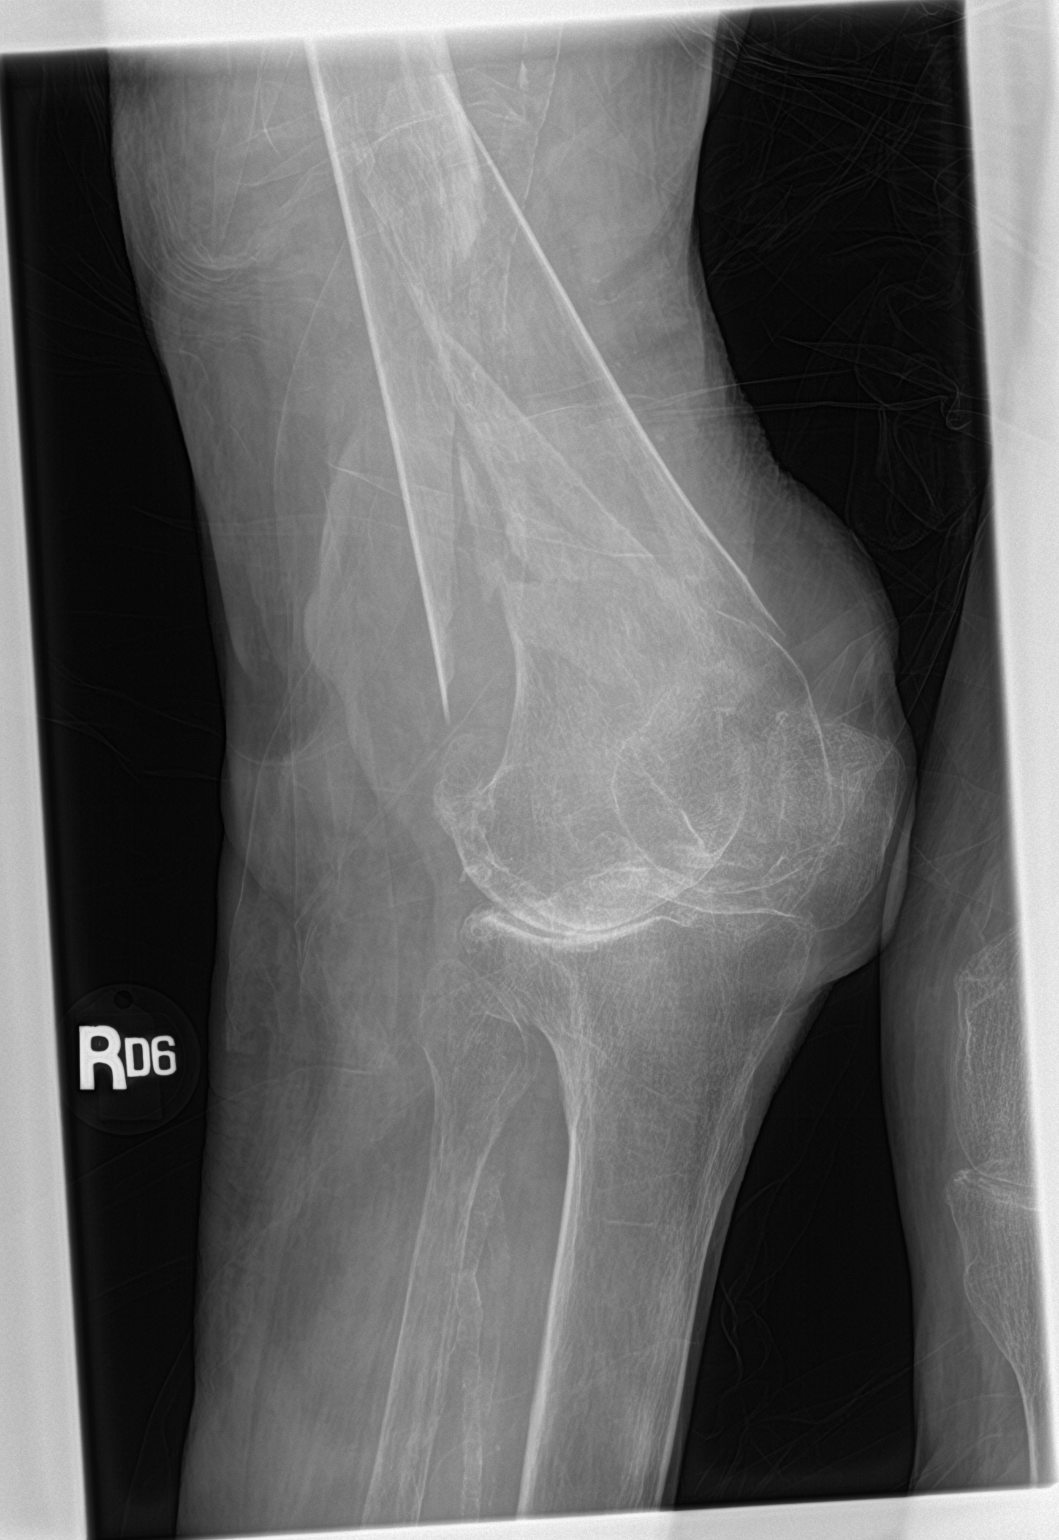

[3 of 3 positions shown; findings below may reference images not displayed]

FINDINGS: Oblique fracture through the distal femoral metadiaphysis with
medial displacement of the distal fracture fragment by 1 bone width.
Mild comminution along the fracture plane which enters the proximal
metaphysis.
IMPRESSION: Oblique comminuted displaced fracture of the distal femur as above.

## 2021-07-09 IMAGING — CR DG CHEST 1V
1 series · 1 of 1 positions shown · non-contrast
Comparison: 03/20/2018

CLINICAL DATA: Fall.

EXAM:
CHEST  1 VIEW

[chest ap]
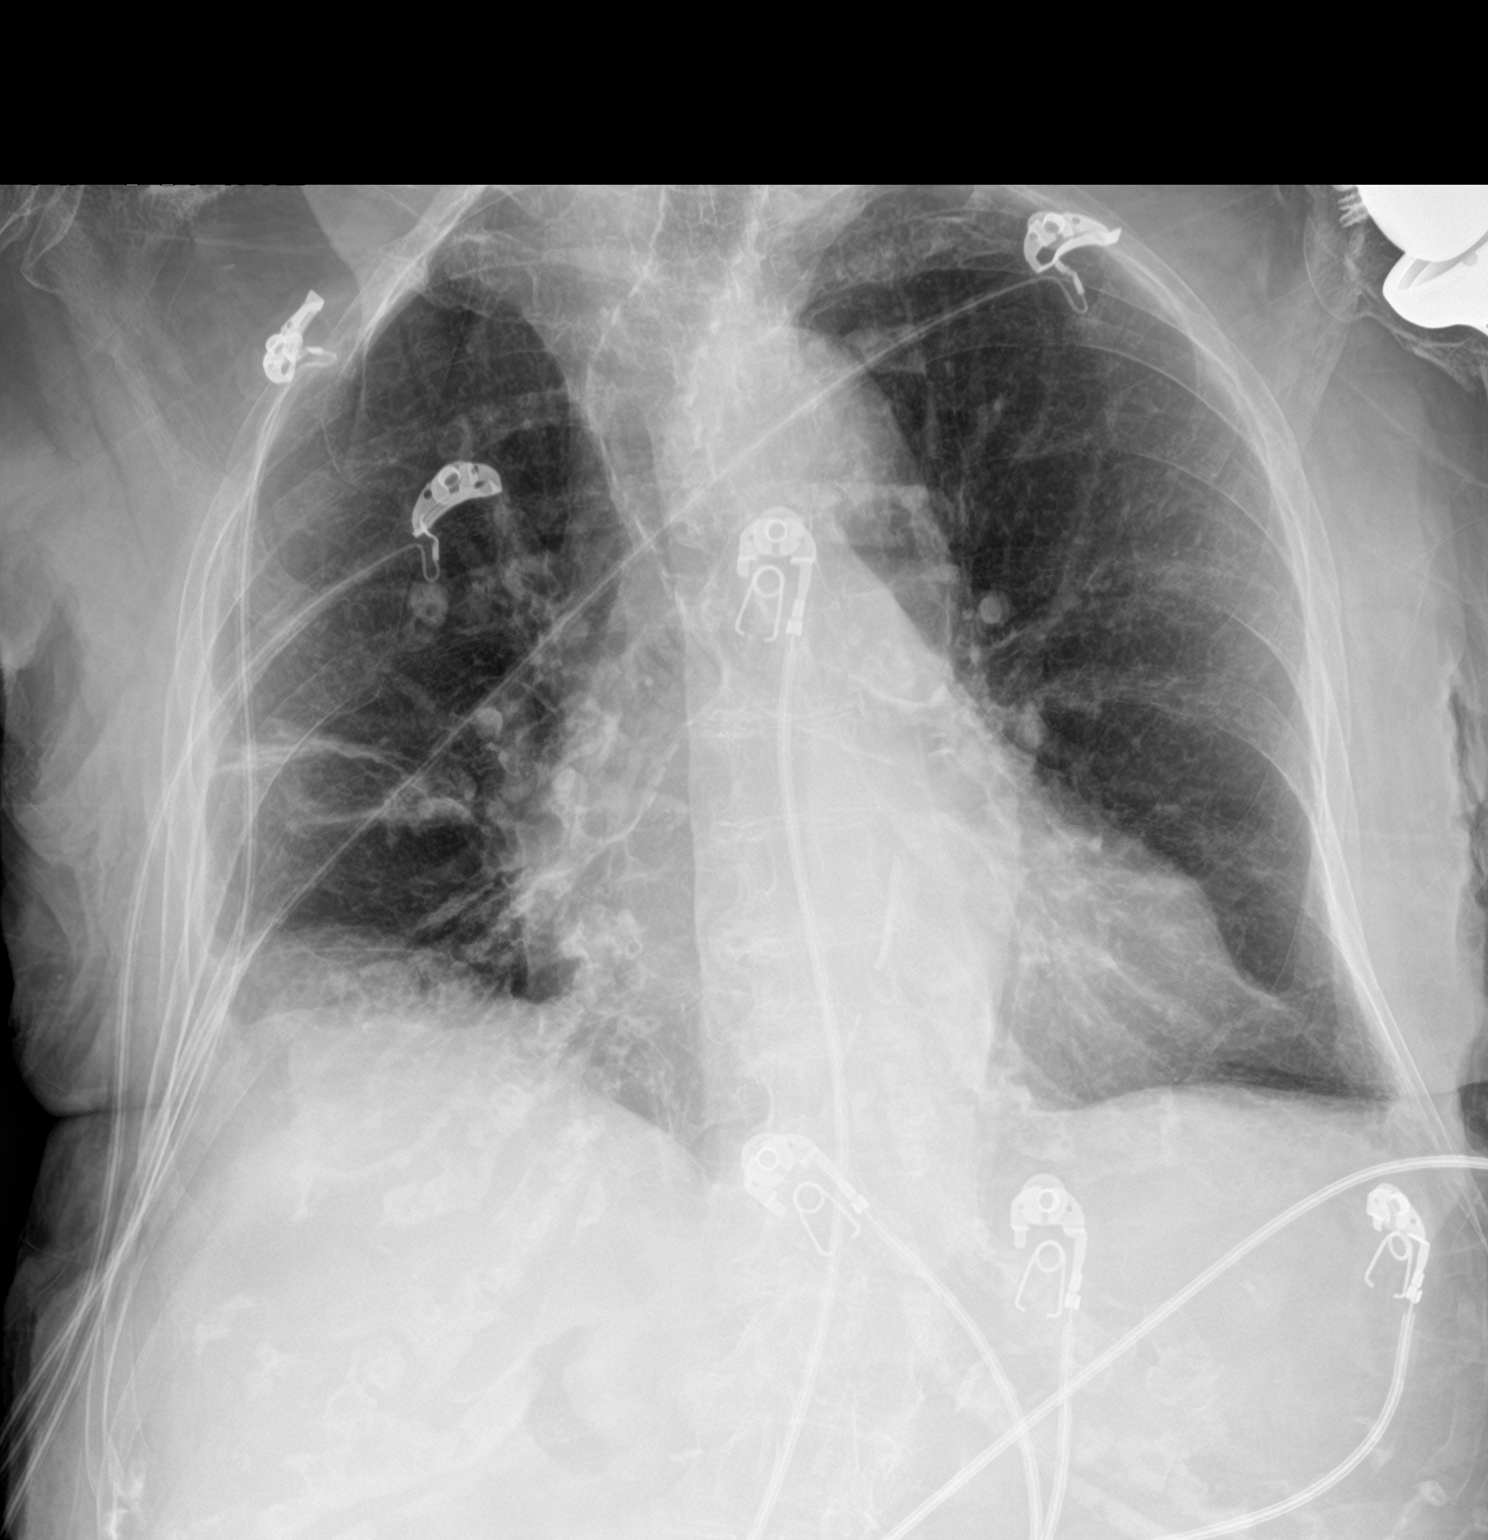

[1 of 1 positions shown; findings below may reference images not displayed]

FINDINGS: Normal cardiac silhouette. Linear atelectasis in the RIGHT midlung.
Lungs are hyperinflated. No effusion infiltrate or pneumothorax
identified. LEFT shoulder arthroplasty noted.
IMPRESSION: 1. No evidence of thoracic trauma.
2. RIGHT lung linear atelectasis.

## 2021-07-11 IMAGING — DX DG FEMUR 2+V*R*
2 series · 5 of 5 positions shown · non-contrast
Comparison: March 29, 2021.

CLINICAL DATA: Status post surgical internal fixation of right
distal femoral fracture.

EXAM:
RIGHT FEMUR 2 VIEWS

[knee]
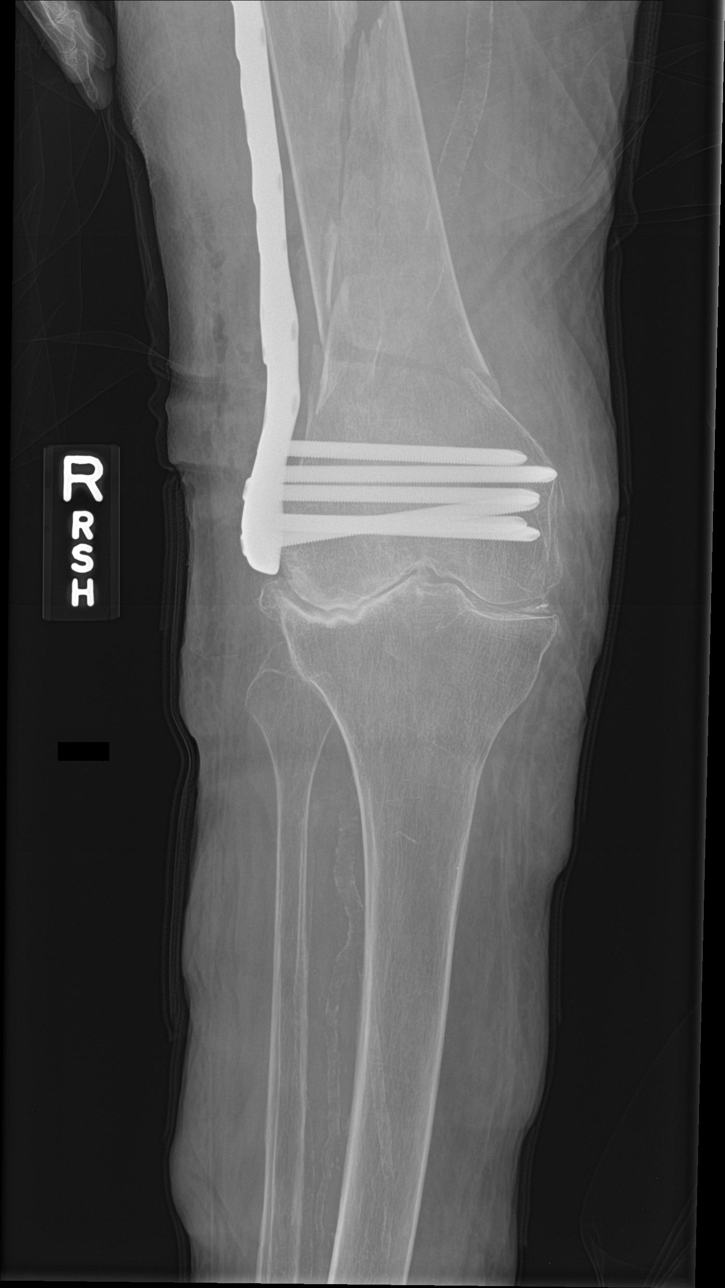

[Series 3: femur · 0.14mm/px · 4 of 4 slices shown]
[im 1/4]
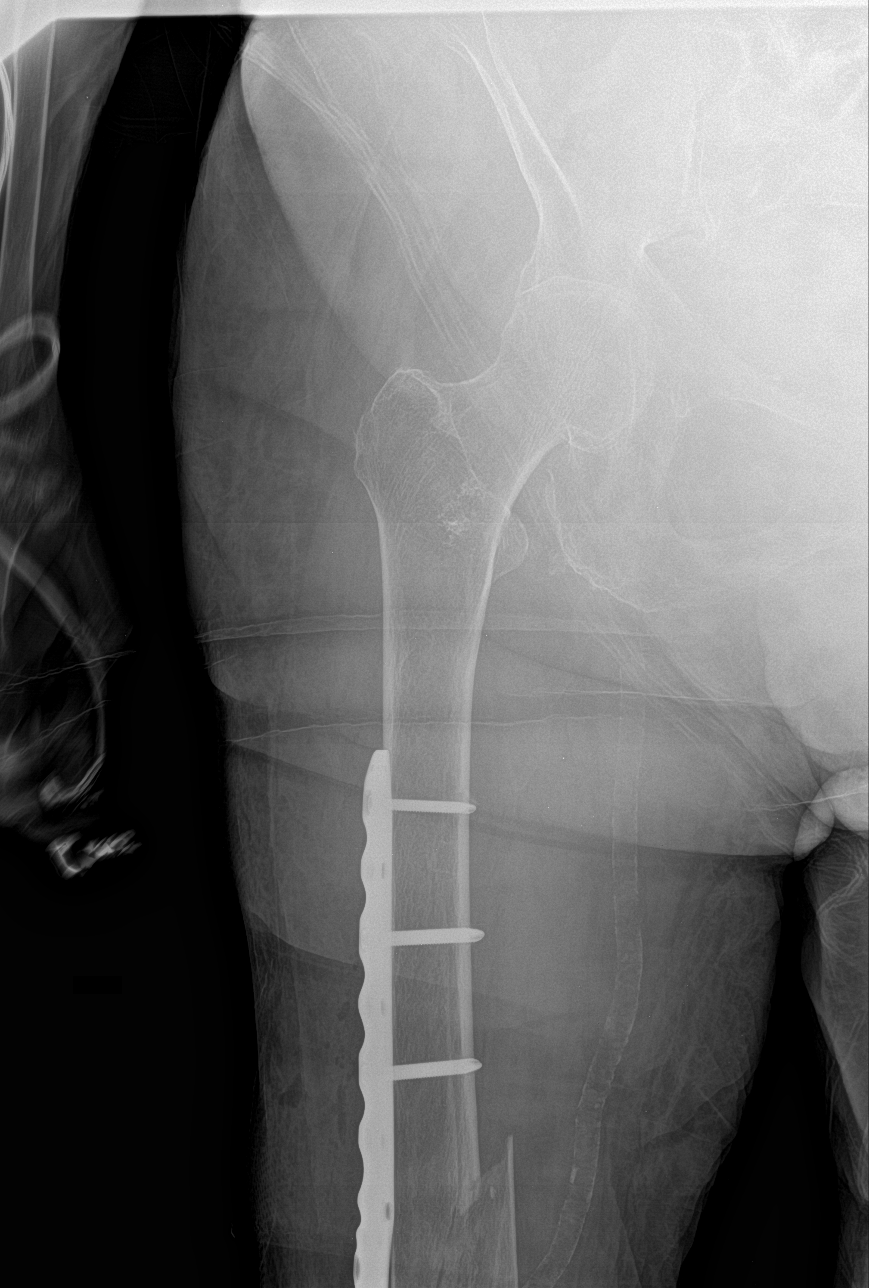
[im 2/4]
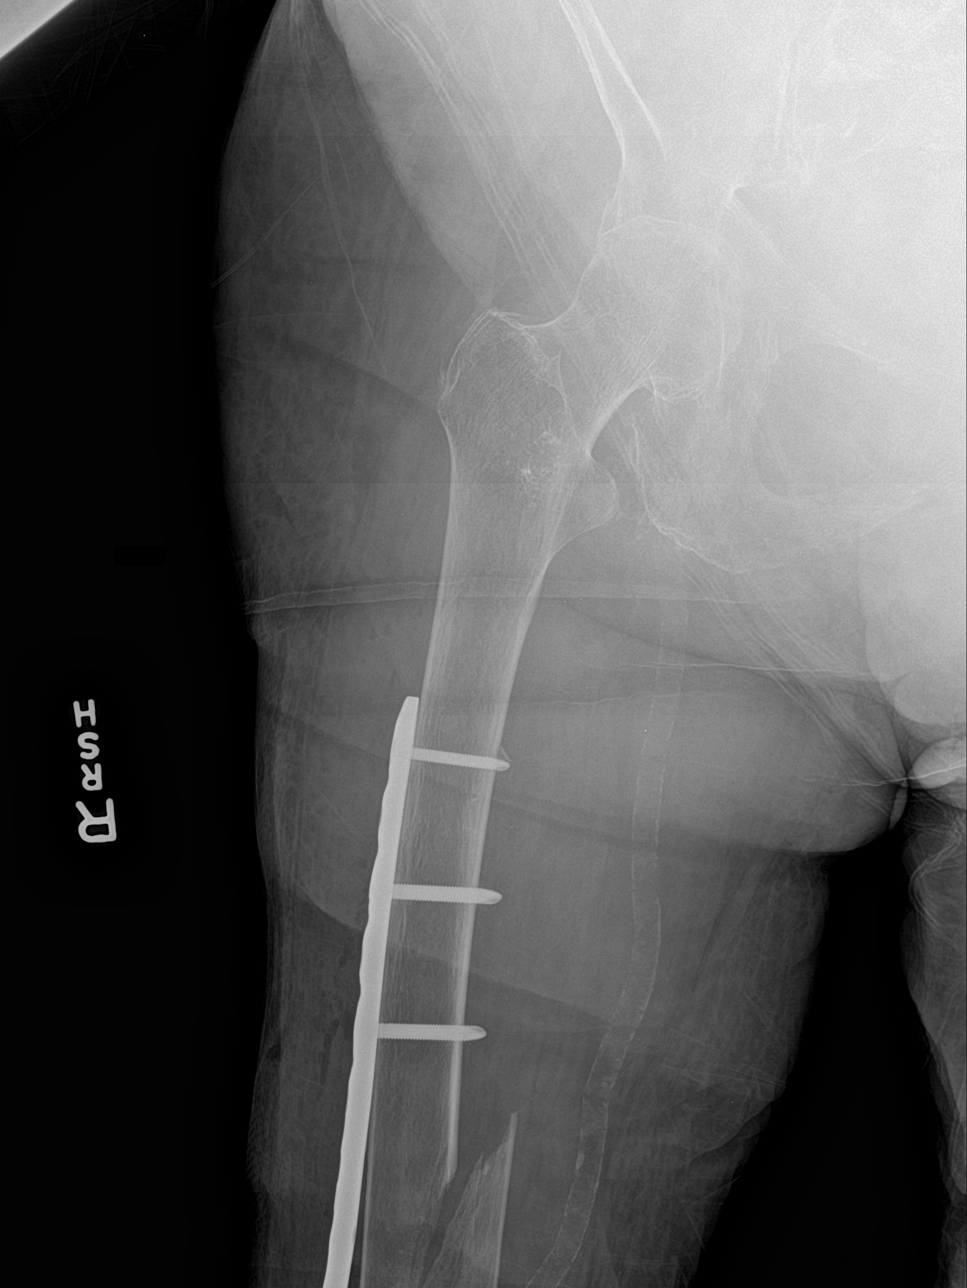
[im 3/4]
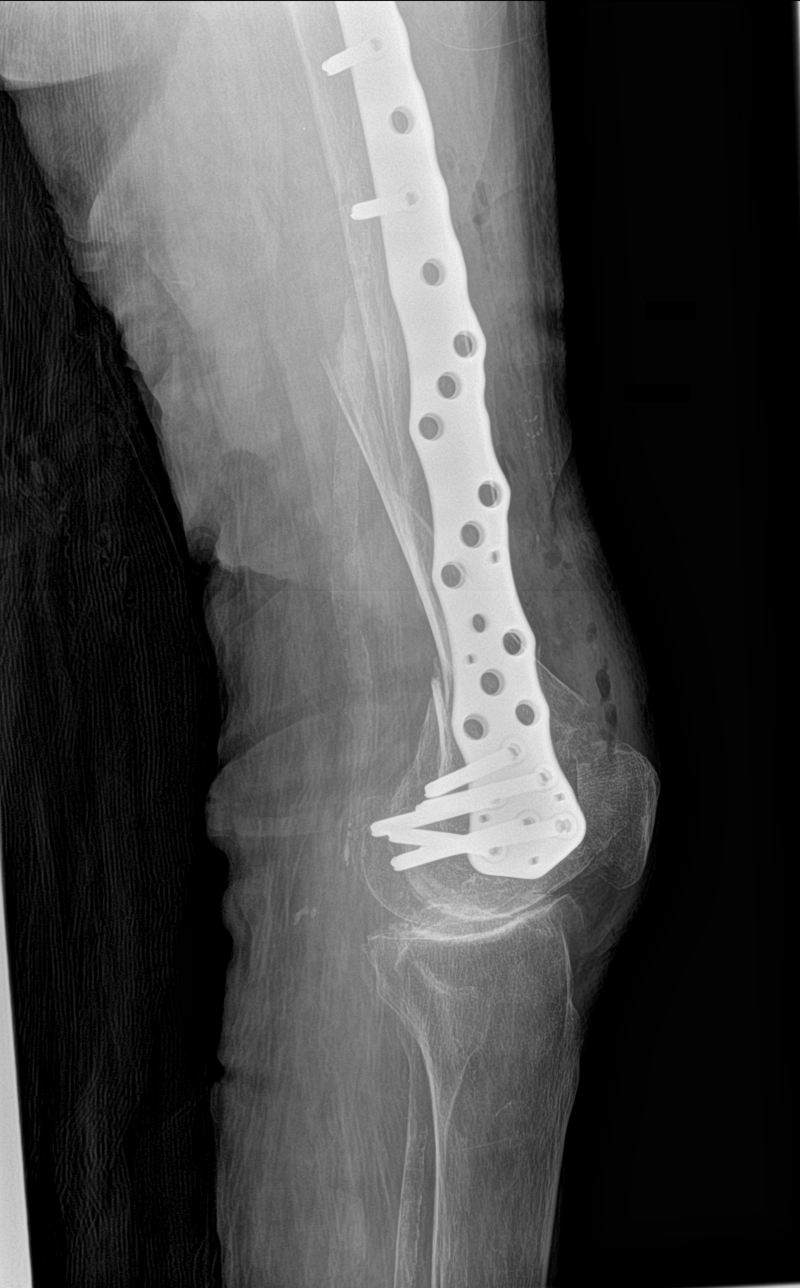
[im 4/4]
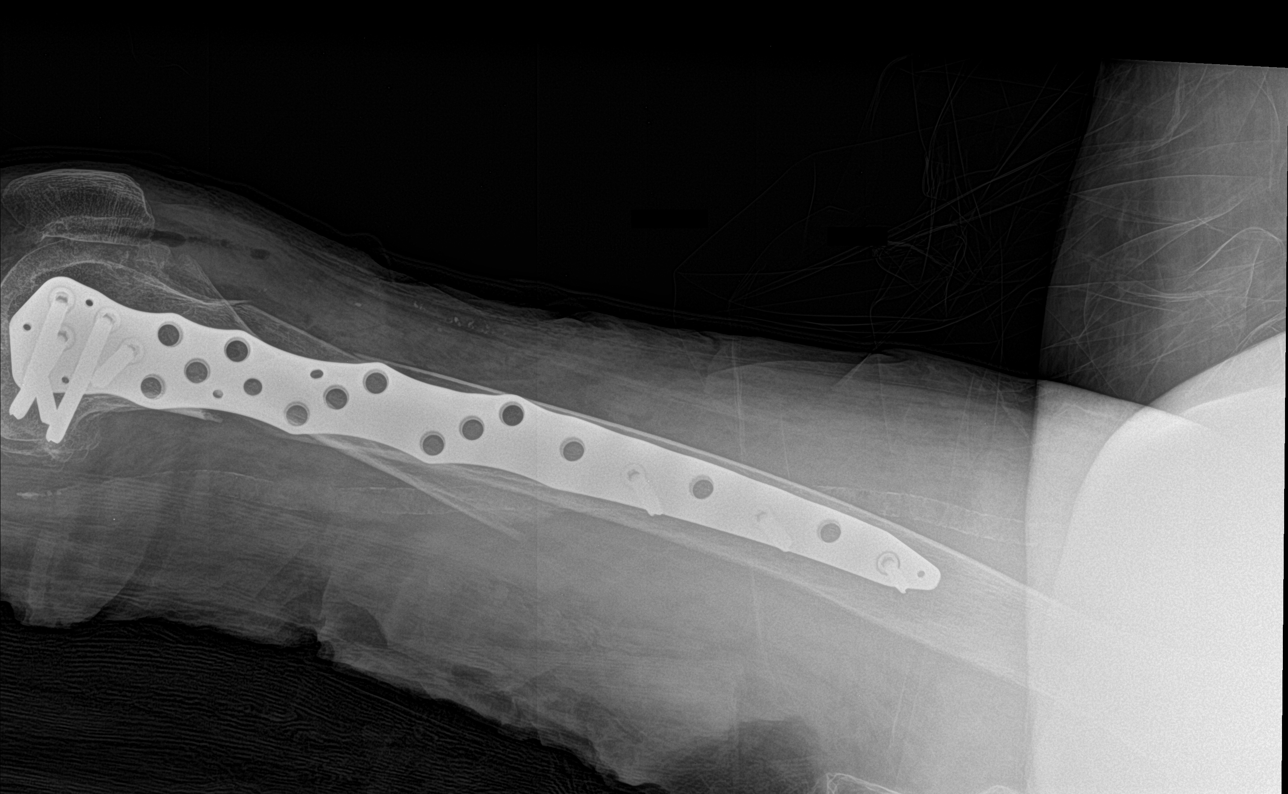

[5 of 5 positions shown; findings below may reference images not displayed]

FINDINGS: Status post surgical internal fixation of comminuted distal right
femoral shaft fracture. Improved alignment of fracture components is
noted.
IMPRESSION: Status post open reduction and surgical internal fixation of right
distal femoral fracture.
